# Patient Record
Sex: Male | Born: 1950 | Race: White | Hispanic: No | Smoking: Never smoker
Health system: Southern US, Community
[De-identification: ages and names within clinical notes are randomized; demographics above are authoritative.]

## PROBLEM LIST (undated history)

## (undated) DIAGNOSIS — K766 Portal hypertension: Secondary | ICD-10-CM

## (undated) DIAGNOSIS — Z91018 Allergy to other foods: Secondary | ICD-10-CM

## (undated) DIAGNOSIS — I8500 Esophageal varices without bleeding: Secondary | ICD-10-CM

## (undated) DIAGNOSIS — Z9289 Personal history of other medical treatment: Secondary | ICD-10-CM

## (undated) DIAGNOSIS — I10 Essential (primary) hypertension: Secondary | ICD-10-CM

## (undated) DIAGNOSIS — H3320 Serous retinal detachment, unspecified eye: Secondary | ICD-10-CM

## (undated) DIAGNOSIS — M1990 Unspecified osteoarthritis, unspecified site: Secondary | ICD-10-CM

## (undated) DIAGNOSIS — D696 Thrombocytopenia, unspecified: Secondary | ICD-10-CM

## (undated) DIAGNOSIS — A6920 Lyme disease, unspecified: Secondary | ICD-10-CM

## (undated) DIAGNOSIS — B1920 Unspecified viral hepatitis C without hepatic coma: Secondary | ICD-10-CM

## (undated) DIAGNOSIS — M869 Osteomyelitis, unspecified: Secondary | ICD-10-CM

## (undated) DIAGNOSIS — K7030 Alcoholic cirrhosis of liver without ascites: Secondary | ICD-10-CM

## (undated) DIAGNOSIS — H35039 Hypertensive retinopathy, unspecified eye: Secondary | ICD-10-CM

## (undated) HISTORY — DX: Hypertensive retinopathy, unspecified eye: H35.039

## (undated) HISTORY — PX: CATARACT EXTRACTION: SUR2

## (undated) HISTORY — PX: RETINAL DETACHMENT SURGERY: SHX105

## (undated) HISTORY — PX: CATARACT EXTRACTION W/ INTRAOCULAR LENS  IMPLANT, BILATERAL: SHX1307

## (undated) HISTORY — DX: Unspecified osteoarthritis, unspecified site: M19.90

## (undated) HISTORY — PX: FRACTURE SURGERY: SHX138

## (undated) HISTORY — PX: HIP ARTHROSCOPY: SHX668

## (undated) HISTORY — PX: EYE SURGERY: SHX253

## (undated) HISTORY — PX: JOINT REPLACEMENT: SHX530

## (undated) HISTORY — DX: Unspecified viral hepatitis C without hepatic coma: B19.20

## (undated) HISTORY — DX: Serous retinal detachment, unspecified eye: H33.20

## (undated) SURGERY — Surgical Case
Anesthesia: *Unknown

---

## 1973-11-07 HISTORY — PX: ORIF CONGENITAL HIP DISLOCATION: SHX2117

## 1973-11-07 HISTORY — PX: ORIF TIBIA & FIBULA FRACTURES: SHX2131

## 2000-09-22 ENCOUNTER — Encounter: Payer: Self-pay | Admitting: Gastroenterology

## 2000-09-22 ENCOUNTER — Ambulatory Visit (HOSPITAL_COMMUNITY): Admission: RE | Admit: 2000-09-22 | Discharge: 2000-09-22 | Payer: Self-pay | Admitting: Gastroenterology

## 2000-09-22 ENCOUNTER — Encounter (INDEPENDENT_AMBULATORY_CARE_PROVIDER_SITE_OTHER): Payer: Self-pay | Admitting: Specialist

## 2006-11-07 HISTORY — PX: LAPAROSCOPIC CHOLECYSTECTOMY: SUR755

## 2007-01-25 ENCOUNTER — Inpatient Hospital Stay (HOSPITAL_COMMUNITY): Admission: EM | Admit: 2007-01-25 | Discharge: 2007-01-28 | Payer: Self-pay | Admitting: Emergency Medicine

## 2007-01-25 ENCOUNTER — Encounter (INDEPENDENT_AMBULATORY_CARE_PROVIDER_SITE_OTHER): Payer: Self-pay | Admitting: *Deleted

## 2008-08-08 ENCOUNTER — Inpatient Hospital Stay (HOSPITAL_COMMUNITY): Admission: AC | Admit: 2008-08-08 | Discharge: 2008-08-13 | Payer: Self-pay

## 2010-06-25 ENCOUNTER — Ambulatory Visit: Payer: Self-pay | Admitting: Internal Medicine

## 2010-06-25 LAB — CONVERTED CEMR LAB
ALT: 150 units/L — ABNORMAL HIGH (ref 0–53)
AST: 174 units/L — ABNORMAL HIGH (ref 0–37)
Albumin: 4.4 g/dL (ref 3.5–5.2)
Alkaline Phosphatase: 134 units/L — ABNORMAL HIGH (ref 39–117)
Basophils Relative: 1 % (ref 0–1)
CO2: 27 meq/L (ref 19–32)
Creatinine, Ser: 0.72 mg/dL (ref 0.40–1.50)
Direct LDL: 102 mg/dL — ABNORMAL HIGH
Glucose, Bld: 97 mg/dL (ref 70–99)
Neutro Abs: 2.4 10*3/uL (ref 1.7–7.7)
Neutrophils Relative %: 49 % (ref 43–77)
Platelets: 145 10*3/uL — ABNORMAL LOW (ref 150–400)
Potassium: 4.9 meq/L (ref 3.5–5.3)
RBC: 4.51 M/uL (ref 4.22–5.81)
Sodium: 137 meq/L (ref 135–145)
TSH: 2.36 microintl units/mL (ref 0.350–4.500)
Total Bilirubin: 0.7 mg/dL (ref 0.3–1.2)
WBC: 4.9 10*3/uL (ref 4.0–10.5)

## 2010-07-09 ENCOUNTER — Ambulatory Visit: Payer: Self-pay | Admitting: Internal Medicine

## 2010-07-09 ENCOUNTER — Encounter (INDEPENDENT_AMBULATORY_CARE_PROVIDER_SITE_OTHER): Payer: Self-pay | Admitting: Family Medicine

## 2010-07-09 LAB — CONVERTED CEMR LAB
AST: 224 units/L — ABNORMAL HIGH (ref 0–37)
Albumin: 4.3 g/dL (ref 3.5–5.2)
BUN: 8 mg/dL (ref 6–23)
CO2: 23 meq/L (ref 19–32)
Calcium: 9.4 mg/dL (ref 8.4–10.5)
Chloride: 90 meq/L — ABNORMAL LOW (ref 96–112)
Glucose, Bld: 107 mg/dL — ABNORMAL HIGH (ref 70–99)
HCV Ab: REACTIVE — AB
HCV Quantitative: 1230000 intl units/mL — ABNORMAL HIGH (ref <43)
Hep B S Ab: NEGATIVE
Sodium: 127 meq/L — ABNORMAL LOW (ref 135–145)
TIBC: 494 ug/dL — ABNORMAL HIGH (ref 215–435)
Total Protein: 7.1 g/dL (ref 6.0–8.3)

## 2010-07-10 ENCOUNTER — Ambulatory Visit (HOSPITAL_COMMUNITY): Admission: RE | Admit: 2010-07-10 | Discharge: 2010-07-10 | Payer: Self-pay | Admitting: Internal Medicine

## 2010-11-07 HISTORY — PX: TOTAL HIP ARTHROPLASTY: SHX124

## 2010-11-11 ENCOUNTER — Ambulatory Visit
Admission: RE | Admit: 2010-11-11 | Discharge: 2010-11-11 | Payer: Self-pay | Source: Home / Self Care | Attending: Gastroenterology | Admitting: Gastroenterology

## 2010-11-11 DIAGNOSIS — B182 Chronic viral hepatitis C: Secondary | ICD-10-CM

## 2010-12-01 ENCOUNTER — Ambulatory Visit (HOSPITAL_COMMUNITY)
Admission: RE | Admit: 2010-12-01 | Discharge: 2010-12-01 | Payer: Self-pay | Source: Home / Self Care | Attending: Gastroenterology | Admitting: Gastroenterology

## 2010-12-01 LAB — CBC
HCT: 38.7 % — ABNORMAL LOW (ref 39.0–52.0)
Hemoglobin: 14.4 g/dL (ref 13.0–17.0)
MCHC: 36.6 g/dL — ABNORMAL HIGH (ref 30.0–36.0)
MCV: 96 fL (ref 78.0–100.0)
Platelets: 132 10*3/uL — ABNORMAL LOW (ref 150–400)
RDW: 12.9 % (ref 11.5–15.5)
WBC: 5.2 10*3/uL (ref 4.0–10.5)

## 2010-12-01 LAB — PROTIME-INR: Prothrombin Time: 15.7 seconds — ABNORMAL HIGH (ref 11.6–15.2)

## 2010-12-08 ENCOUNTER — Encounter: Payer: Self-pay | Admitting: Gastroenterology

## 2010-12-10 ENCOUNTER — Emergency Department (HOSPITAL_COMMUNITY)
Admission: EM | Admit: 2010-12-10 | Discharge: 2010-12-10 | Disposition: A | Discharge status: Home / Self Care | Payer: Self-pay | Attending: Emergency Medicine | Admitting: Emergency Medicine

## 2010-12-10 ENCOUNTER — Emergency Department (HOSPITAL_COMMUNITY): Payer: Self-pay

## 2010-12-10 DIAGNOSIS — M1610 Unilateral primary osteoarthritis, unspecified hip: Secondary | ICD-10-CM | POA: Insufficient documentation

## 2010-12-10 DIAGNOSIS — Z9089 Acquired absence of other organs: Secondary | ICD-10-CM | POA: Insufficient documentation

## 2010-12-10 DIAGNOSIS — I10 Essential (primary) hypertension: Secondary | ICD-10-CM | POA: Insufficient documentation

## 2010-12-10 DIAGNOSIS — M169 Osteoarthritis of hip, unspecified: Secondary | ICD-10-CM | POA: Insufficient documentation

## 2010-12-10 DIAGNOSIS — Z8619 Personal history of other infectious and parasitic diseases: Secondary | ICD-10-CM | POA: Insufficient documentation

## 2010-12-10 DIAGNOSIS — R1011 Right upper quadrant pain: Secondary | ICD-10-CM | POA: Insufficient documentation

## 2010-12-10 DIAGNOSIS — E871 Hypo-osmolality and hyponatremia: Secondary | ICD-10-CM | POA: Insufficient documentation

## 2010-12-10 DIAGNOSIS — K7460 Unspecified cirrhosis of liver: Secondary | ICD-10-CM | POA: Insufficient documentation

## 2010-12-10 DIAGNOSIS — Z9889 Other specified postprocedural states: Secondary | ICD-10-CM | POA: Insufficient documentation

## 2010-12-10 LAB — COMPREHENSIVE METABOLIC PANEL
ALT: 157 U/L — ABNORMAL HIGH (ref 0–53)
Albumin: 3.8 g/dL (ref 3.5–5.2)
BUN: 9 mg/dL (ref 6–23)
CO2: 28 mEq/L (ref 19–32)
Chloride: 87 mEq/L — ABNORMAL LOW (ref 96–112)
Creatinine, Ser: 0.62 mg/dL (ref 0.4–1.5)
GFR calc Af Amer: >60 mL/min (ref >60)
Glucose, Bld: 101 mg/dL — ABNORMAL HIGH (ref 70–99)
Total Bilirubin: 1.5 mg/dL — ABNORMAL HIGH (ref 0.3–1.2)

## 2010-12-10 LAB — DIFFERENTIAL
Basophils Absolute: 0.1 10*3/uL (ref 0.0–0.1)
Eosinophils Relative: 6 % — ABNORMAL HIGH (ref 0–5)
Lymphocytes Relative: 34 % (ref 12–46)
Lymphocytes Relative: 31 % (ref 12–46)
Lymphs Abs: 2.7 10*3/uL (ref 0.7–4.0)
Monocytes Absolute: 1 10*3/uL (ref 0.1–1.0)
Monocytes Relative: 13 % — ABNORMAL HIGH (ref 3–12)
Monocytes Relative: 15 % — ABNORMAL HIGH (ref 3–12)
Neutrophils Relative %: 47 % (ref 43–77)

## 2010-12-10 LAB — CBC
HCT: 38.2 % — ABNORMAL LOW (ref 39.0–52.0)
Hemoglobin: 14.1 g/dL (ref 13.0–17.0)
MCHC: 37.5 g/dL — ABNORMAL HIGH (ref 30.0–36.0)
MCHC: 36.9 g/dL — ABNORMAL HIGH (ref 30.0–36.0)

## 2010-12-10 LAB — PROTIME-INR
INR: 1.1 (ref 0.00–1.49)
Prothrombin Time: 14.4 seconds (ref 11.6–15.2)

## 2010-12-10 MED ORDER — IOHEXOL 300 MG/ML  SOLN: 100.0000 mL | Freq: Once | INTRAMUSCULAR | Status: DC | PRN
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
## 2011-03-22 NOTE — Op Note (Signed)
NAME:  Joshua Burton, Joshua Burton                 ACCOUNT NO.:  [id number]   MEDICAL RECORD NO.:  [id number]          PATIENT TYPE:  INP   LOCATION:  3009                         FACILITY:  [facility]   PHYSICIAN:  Juanetta Gosling, MDDATE OF BIRTH:  [birth year]   DATE OF PROCEDURE:  08/08/2008  DATE OF DISCHARGE:                               OPERATIVE REPORT   INDICATIONS:  Glabellar laceration.   ANESTHESIA:  Approximately 3 mL of 2% lidocaine with epinephrine.   SURGEON:  Nolon Bussing. Tinnie Gens, PA-C   ASSISTANT:  None.   ESTIMATED BLOOD LOSS:  None.   COMPLICATIONS:  None.   FINDINGS:  After the patient was anesthetized with the lidocaine, he was  prepped and draped in a sterile fashion.  He then had his wound scrubbed  with sterile 4 x 4s soaked in Betadine and then washed out with  approximately 75 mL of normal saline irrigation.  Some old clots were  evacuated from the wound and when this was done, it was evident that the  laceration continued down to the galea.  A single 4-0 Vicryl suture was  placed to approximate the lower structures.  Then 6-0 interrupted nylon  sutures were used to approximate the rest of the wound.  The patient had  good approximation and tolerated the procedure well.  Wound was dressed  with bacitracin and dry dressing.      Earney Hamburg, P.A.      Juanetta Gosling, MD  Electronically Signed    MJ/MEDQ  D:  08/08/2008  T:  08/09/2008  Job:  [id number]

## 2011-03-25 NOTE — Discharge Summary (Signed)
NAME:  Joshua Burton, Joshua Burton                 ACCOUNT NO.:  [id number]   MEDICAL RECORD NO.:  [id number]          PATIENT TYPE:  INP   LOCATION:  5705                         FACILITY:  [facility]   PHYSICIAN:  Sharlet Salina T. Hoxworth, M.D.DATE OF BIRTH:  [birth year]   DATE OF ADMISSION:  01/24/2007  DATE OF DISCHARGE:  01/28/2007                               DISCHARGE SUMMARY   DISCHARGE DIAGNOSES:  1. Acute cholecystitis.  2. Hepatitis C.   OPERATIONS AND PROCEDURES:  Laparoscopic cholecystectomy, Dr. Derrell Lolling on  January 26, 2007.   HISTORY OF PRESENT ILLNESS:  This patient is a 60 year old male who  presents with acute right upper quadrant abdominal pain.  He has a  history of hepatitis C.   PAST MEDICAL HISTORY:  As above with history of liver biopsy.  No other  medical or surgical problems.   MEDICATIONS:  None.   ALLERGIES:  None.   PERTINENT PHYSICAL EXAMINATION:  He had low-grade fever, mild  tachycardia.  Abdominal exam showed marked right upper quadrant  tenderness with guarding, well localized.   LABORATORY:  White count was elevated 11.9.   CT scan of the abdomen and pelvis obtained in the emergency room showed  a significant acute cholecystitis with fluid around the gallbladder.   HOSPITAL COURSE:  The patient was admitted, begun on IV antibiotics and  hydration.  He was taken to the operating room on January 25, 2007, by Dr.  Derrell Lolling and underwent laparoscopic cholecystectomy with intraoperative  cholangiogram with findings of acute cholecystitis.   His postoperative course was marked by a fair amount of soreness and  some delay in getting up and around, but he progressed without  complication and was discharged home on January 28, 2007.  Jackson-Pratt  drain that had been left in was removed prior to discharge.   Final pathology showed acute gangrenous cholecystitis and  cholelithiasis.   DISCHARGE MEDICATIONS:  Only oral pain medication.   FOLLOWUP:  With Dr. Derrell Lolling in the  office in 1-2 weeks.      Lorne Skeens. Hoxworth, M.D.  Electronically Signed     BTH/MEDQ  D:  02/28/2007  T:  02/28/2007  Job:  [id number]

## 2011-03-25 NOTE — Op Note (Signed)
NAME:  Joshua Burton, Joshua Burton                 ACCOUNT NO.:  [id number]   MEDICAL RECORD NO.:  [id number]          PATIENT TYPE:  INP   LOCATION:  5705                         FACILITY:  [facility]   PHYSICIAN:  Angelia Mould. Derrell Lolling, M.D.DATE OF BIRTH:  [birth year]   DATE OF PROCEDURE:  01/25/2007  DATE OF DISCHARGE:                               OPERATIVE REPORT   PREOPERATIVE DIAGNOSIS:  Acute cholecystitis with cholelithiasis.   POSTOPERATIVE DIAGNOSIS:  Acute cholecystitis with cholelithiasis.   OPERATION PERFORMED:  Laparoscopic cholecystectomy with intraoperative  cholangiogram.   SURGEON:  Angelia Mould. Derrell Lolling, M.D.   FIRST ASSISTANT:  Gabrielle Dare. Janee Morn, M.D.   OPERATIVE INDICATIONS:  This is a 60 year old white man who was admitted  late last night with a day and a half history of a upper abdominal and  right upper quadrant pain and nausea.  He has a past history of  hepatitis C followed by [name].  He was admitted by Dr. Johna Sheriff,  started on intravenous antibiotics.  He was counseled regarding  cholecystectomy.  It should be noted that his CT scan showed a thick-  walled inflamed gallbladder.  He agreed to the surgery.  He is brought  to operating room semi urgently.   OPERATIVE FINDINGS:  The gallbladder was thick-walled, acutely inflamed,  edematous.  Somewhat discolored in a patchy fashion.  The cystic duct  was short, but was patent.  The anatomy of the cystic duct, cystic  artery and common bile duct were conventional.  The cholangiogram was  normal showing normal intrahepatic and extrahepatic bile ducts, no  filling defects, and no obstruction with good flow of contrast into the  duodenum.  The liver, duodenum, stomach, large intestine, small  intestine and peritoneal surfaces were otherwise grossly normal.   OPERATIVE TECHNIQUE:  Following the induction of general endotracheal  anesthesia the patient's abdomen was prepped and draped in a sterile  fashion.  The patient was  identified as to correct patient and correct  procedure.  Intravenous antibiotics were given.  Marcaine 0.5% with  epinephrine was used as local infiltration anesthetic.  A vertically  oriented incision was made just above the umbilicus.  The fascia was  incised in the midline and the abdominal cavity entered under direct  vision.  A 10 mm Hassan trocar was inserted and secured with a  pursestring suture of 0 Vicryl.  Pneumoperitoneum was created.  The  camera was inserted with visualization findings as described above.  A  10 mm trocar was placed in the subxiphoid region and two 5 mm trocars  placed in the right upper quadrant.  I ultimately had to place another 5  mm trocar about midway between the umbilicus and the xiphoid for a  deformable retractor.  The gallbladder fundus was visualized and  dissected away from the inflammatory rind around it.  Suction trocar was  inserted into the fundus and we aspirated very dark bile out of the  gallbladder.  This collapsed somewhat and allowed [place] to grab it.  We  then elevated the gallbladder and then slowly dissected the inflammatory  tissues  away from it down to the lower body and infundibulum.  A  retractor was placed below the infundibulum to retract the tissues  inferiorly and posteriorly and then I could dissect out the cystic duct  and the cystic artery.  The cystic artery was secured with multiple  metal clips and divided as it went onto the wall of the gallbladder.  A  cholangiogram catheter was inserted into the cystic duct.  A  cholangiogram was obtained using the C-arm.  The cholangiogram was  normal as described above.  The cholangiogram catheter was removed.  The  cystic duct was secured with multiple metal clips and divided.  The  gallbladder was dissected from its bed with electrocautery and blunt  dissection, placed in a specimen bag and removed through the umbilical  port.  We had to make a small extension of the umbilical  incision to get  the gallbladder out because the stones were so large an it was so  thickened.  The operative field was copiously irrigated.  Hemostasis was  good and used with electrocautery in the bed of the gallbladder.  I  irrigated out all the fluid and I saw no bleeding and no bile leak  whatsoever at the end of the case.  I did chose to put a piece of  Surgicel in the lower body of the gallbladder because it was such a raw  surface.  A 19-French Blake drain was placed in the subhepatic space,  brought out through one of the right upper quadrant trocar sites and  sutured to the skin with a nylon suture and connected to a suction bulb.  We looked around, saw no other signs of bleeding or problems.  Trocars  were removed.  No bleeding from trocar sites.  Pneumoperitoneum  released.  The fascia at the umbilicus was closed with several  interrupted sutures of 0 Vicryl.  Skin incisions were closed with skin  staples.  Clean bandages were placed and the patient taken to the  recovery room in stable condition.  Estimated blood loss was about 30 to  40 cc.  Complications:  None.  Sponge and instrument counts were  correct.      Angelia Mould. Derrell Lolling, M.D.  Electronically Signed     HMI/MEDQ  D:  01/25/2007  T:  01/25/2007  Job:  [id number]   cc:   [facility]  Fayrene Fearing L. Malon Kindle., M.D.

## 2011-03-25 NOTE — H&P (Signed)
NAME:  Joshua Burton, Joshua Burton                 ACCOUNT NO.:  [id number]   MEDICAL RECORD NO.:  [id number]          PATIENT TYPE:  EMS   LOCATION:  MAJO                         FACILITY:  [facility]   PHYSICIAN:  Sharlet Salina T. Hoxworth, M.D.DATE OF BIRTH:  1950-12-11   DATE OF ADMISSION:  01/24/2007  DATE OF DISCHARGE:                              HISTORY & PHYSICAL   CHIEF COMPLAINT:  Right upper quadrant abdominal pain.   HISTORY OF PRESENT ILLNESS:  Mr. Joshua Burton is a 60 year old male who late last  night, about 24 hours ago, developed the rapid onset of right upper  quadrant abdominal pain that quickly became severe.  The pain has been  constant ever since.  He describes aching and cramping pain in his right  upper quadrant and up under his right rib cage, radiating through to his  back.  This has been associated with nausea and he has vomited 2 times.  He tried to work today, but the pain was severe and [facility] went to [facility] and then was referred from there to the [facility]  Emergency Room.  The patient states he had a very mild episode of  similar pain several days ago that resolved prior to this episode.  Otherwise, he denies any similar or recurrent pain or chronic GI  complaints.  Bowel movements have been normal.  No melena or  hematochezia.   PAST MEDICAL HISTORY:  Significant for hepatitis C; this has been  followed by Dr. Vilinda Burton.  He has had a liver biopsy.  He has not  required any recent followup and has no known sequelae.  Otherwise, no  significant medical or surgical problems.   MEDICATIONS:  None.   ALLERGIES:  None.   SOCIAL HISTORY:  He is divorced.  He works at his own plumbing business.  He does not smoke cigarettes, drinks about 2 beers a day.   FAMILY HISTORY:  Noncontributory.   REVIEW OF SYSTEMS:  GENERAL:  Some fever with this illness.  No chills.  HEENT:  No vision, hearing or swallowing problems.  RESPIRATORY:  No  shortness of breath, cough or history  of pulmonary disease.  CARDIAC:  No chest pain, palpitations or history of heart disease.  ABDOMEN/GI:  As above.  GU:  No urinary burning or frequency.  MUSCULOSKELETAL:  No  joint pain or swelling.  HEMATOLOGIC:  No history of abnormal bleeding  or blood clot.   PHYSICAL EXAM:  VITAL SIGNS:  Temperature is 100.6, pulse 115,  respirations 18, blood pressure 153/87.  GENERAL:  Muscular male appears in pain.  SKIN:  Warm and dry.  No rash or infection.  HEENT:  No palpable mass or thyromegaly.  Sclerae are anicteric.  Nares  and oropharynx clear.  LUNGS:  Clear without wheezing or increased work of breathing.  LYMPH NODES:  No cervical, supraclavicular or inguinal nodes palpable.  CARDIAC:  Regular tachycardia and no murmurs.  Peripheral pulses intact.  No JVD or edema.  ABDOMEN:  Marked right upper quadrant tenderness with guarding, well  localized.  No palpable masses or hepatosplenomegaly.  No hernias.  EXTREMITIES:  No joint swelling or deformity.  NEUROLOGIC:  Alert and oriented.  Motor and sensory exams grossly  normal.   LABORATORY:  White count 11.9, hemoglobin is 17.5, hematocrit 51.7,  platelets 240,000.  Urinalysis negative.  LFTs, lipase, electrolytes  pending.   CT scan of the abdomen and pelvis was obtained in the emergency room,  which shows a significant acute cholecystitis with fluid around the  gallbladder.  No other abnormalities.   ASSESSMENT AND PLAN:  Acute cholecystitis.  The patient is being  admitted and started on broad-spectrum intravenous antibiotics.  We will  obtain liver function tests, lipase and clotting studies.  Will need  laparoscopic cholecystectomy.      Joshua Burton. Hoxworth, M.D.  Electronically Signed     BTH/MEDQ  D:  01/25/2007  T:  01/25/2007  Job:  [id number]

## 2011-08-08 LAB — CBC
HCT: 25.3 — ABNORMAL LOW
HCT: 28.5 — ABNORMAL LOW
HCT: 29.4 — ABNORMAL LOW
Hemoglobin: 10.2 — ABNORMAL LOW
Hemoglobin: 12.4 — ABNORMAL LOW
Hemoglobin: 15.8
Hemoglobin: 8.7 — ABNORMAL LOW
MCHC: 34.4
MCHC: 34.5
MCHC: 34.8
MCHC: 35.1
MCV: 95.6
MCV: 95.9
MCV: 96.2
MCV: 96.3
RBC: 2.63 — ABNORMAL LOW
RBC: 2.98 — ABNORMAL LOW
RBC: 3.7 — ABNORMAL LOW
RBC: 4.74
RDW: 12.6
RDW: 13.5
WBC: 5.1
WBC: 7.5
WBC: 8.5

## 2011-08-08 LAB — BASIC METABOLIC PANEL
BUN: 5 — ABNORMAL LOW
BUN: 6
CO2: 26
CO2: 28
CO2: 28
Calcium: 7.8 — ABNORMAL LOW
Chloride: 102
Chloride: 94 — ABNORMAL LOW
Chloride: 97
GFR calc Af Amer: >60
GFR calc Af Amer: >60
GFR calc non Af Amer: >60
Glucose, Bld: 103 — ABNORMAL HIGH
Glucose, Bld: 161 — ABNORMAL HIGH
Potassium: 3.2 — ABNORMAL LOW
Potassium: 4
Sodium: 126 — ABNORMAL LOW
Sodium: 130 — ABNORMAL LOW

## 2011-08-08 LAB — RAPID URINE DRUG SCREEN, HOSP PERFORMED
Amphetamines: NOT DETECTED
Barbiturates: NOT DETECTED
Benzodiazepines: NOT DETECTED

## 2011-08-08 LAB — DIFFERENTIAL
Basophils Absolute: 0.1
Basophils Relative: 1
Eosinophils Absolute: 0.2
Eosinophils Relative: 3
Neutrophils Relative %: 37 — ABNORMAL LOW

## 2011-08-08 LAB — CROSSMATCH

## 2011-08-08 LAB — LIPASE, BLOOD: Lipase: 39

## 2011-08-08 LAB — SAMPLE TO BLOOD BANK

## 2011-08-08 LAB — URINALYSIS, ROUTINE W REFLEX MICROSCOPIC
Glucose, UA: NEGATIVE
Hgb urine dipstick: NEGATIVE
Ketones, ur: NEGATIVE
Ketones, ur: NEGATIVE
Protein, ur: NEGATIVE
Urobilinogen, UA: 0.2
pH: 7

## 2011-08-08 LAB — COMPREHENSIVE METABOLIC PANEL
ALT: 188 — ABNORMAL HIGH
Alkaline Phosphatase: 130 — ABNORMAL HIGH
CO2: 23
Calcium: 8.4
Chloride: 104
Glucose, Bld: 118 — ABNORMAL HIGH
Potassium: 4
Sodium: 136
Total Bilirubin: 0.8

## 2011-08-08 LAB — POCT I-STAT, CHEM 8
Calcium, Ion: 0.84 — ABNORMAL LOW
Hemoglobin: 16
Sodium: 135
TCO2: 21

## 2013-01-05 ENCOUNTER — Emergency Department (HOSPITAL_COMMUNITY)
Admission: EM | Admit: 2013-01-05 | Discharge: 2013-01-05 | Disposition: A | Discharge status: Home / Self Care | Payer: Medicare PPO | Source: Home / Self Care | Attending: Family Medicine | Admitting: Family Medicine

## 2013-01-05 ENCOUNTER — Emergency Department (HOSPITAL_COMMUNITY): Payer: Medicare PPO

## 2013-01-05 ENCOUNTER — Encounter (HOSPITAL_COMMUNITY): Payer: Self-pay | Admitting: Physical Medicine and Rehabilitation

## 2013-01-05 ENCOUNTER — Inpatient Hospital Stay (HOSPITAL_COMMUNITY)
Admission: EM | Admit: 2013-01-05 | Discharge: 2013-01-12 | DRG: 603 | Disposition: A | Discharge status: Home / Self Care | Payer: Medicare PPO | Attending: Internal Medicine | Admitting: Internal Medicine

## 2013-01-05 ENCOUNTER — Encounter (HOSPITAL_COMMUNITY): Payer: Self-pay | Admitting: Emergency Medicine

## 2013-01-05 DIAGNOSIS — I10 Essential (primary) hypertension: Secondary | ICD-10-CM | POA: Diagnosis present

## 2013-01-05 DIAGNOSIS — L03119 Cellulitis of unspecified part of limb: Secondary | ICD-10-CM

## 2013-01-05 DIAGNOSIS — Z8249 Family history of ischemic heart disease and other diseases of the circulatory system: Secondary | ICD-10-CM

## 2013-01-05 DIAGNOSIS — L02419 Cutaneous abscess of limb, unspecified: Principal | ICD-10-CM | POA: Diagnosis present

## 2013-01-05 DIAGNOSIS — I889 Nonspecific lymphadenitis, unspecified: Secondary | ICD-10-CM

## 2013-01-05 DIAGNOSIS — E871 Hypo-osmolality and hyponatremia: Secondary | ICD-10-CM | POA: Diagnosis present

## 2013-01-05 DIAGNOSIS — D696 Thrombocytopenia, unspecified: Secondary | ICD-10-CM | POA: Diagnosis present

## 2013-01-05 DIAGNOSIS — Z79899 Other long term (current) drug therapy: Secondary | ICD-10-CM

## 2013-01-05 HISTORY — DX: Essential (primary) hypertension: I10

## 2013-01-05 LAB — POCT I-STAT, CHEM 8
Creatinine, Ser: 0.8 mg/dL (ref 0.50–1.35)
HCT: 47 % (ref 39.0–52.0)
Hemoglobin: 16 g/dL (ref 13.0–17.0)
Potassium: 4.1 mEq/L (ref 3.5–5.1)
Sodium: 130 mEq/L — ABNORMAL LOW (ref 135–145)
TCO2: 29 mmol/L (ref 0–100)

## 2013-01-05 LAB — CBC WITH DIFFERENTIAL/PLATELET
Basophils Absolute: 0.1 10*3/uL (ref 0.0–0.1)
Basophils Relative: 1 % (ref 0–1)
Eosinophils Absolute: 0.4 10*3/uL (ref 0.0–0.7)
Eosinophils Relative: 4 % (ref 0–5)
MCH: 34.1 pg — ABNORMAL HIGH (ref 26.0–34.0)
MCHC: 35.5 g/dL (ref 30.0–36.0)
MCV: 96.1 fL (ref 78.0–100.0)
Neutrophils Relative %: 53 % (ref 43–77)
Platelets: 128 10*3/uL — ABNORMAL LOW (ref 150–400)
RBC: 4.34 MIL/uL (ref 4.22–5.81)
RDW: 13.2 % (ref 11.5–15.5)

## 2013-01-05 MED ORDER — OXYCODONE-ACETAMINOPHEN 5-325 MG PO TABS
2.0000 | ORAL_TABLET | Freq: Once | ORAL | Status: AC
  Administered 2013-01-05: 2 via ORAL
  Filled 2013-01-05: qty 2

## 2013-01-05 MED ORDER — VANCOMYCIN HCL IN DEXTROSE 1-5 GM/200ML-% IV SOLN
1000.0000 mg | Freq: Two times a day (BID) | INTRAVENOUS | Status: DC
  Administered 2013-01-05: 1000 mg via INTRAVENOUS
  Filled 2013-01-05: qty 200

## 2013-01-05 NOTE — ED Notes (Signed)
Right lower leg, ankle, foot appear red and swollen, no injury noted. Pt had reconstructive surgery on same leg in '75. Also reports tender lymph nodes in groin region since this swelling has started.  Leg propped up on pillows.
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
## 2013-01-05 NOTE — ED Notes (Signed)
Pt presents to department from [facility] for evaluation of R ankle foot pain and swelling. Onset this morning. R ankle/foot noted to be red and swollen. 8/10 pain, tender to touch. CMS intact. Denies numbness/tingling. Pt is conscious alert and oriented x4.

## 2013-01-05 NOTE — ED Provider Notes (Signed)
History     CSN: [id number]  Arrival date & time 01/05/13  1618   First MD Initiated Contact with Patient 01/05/13 1619      Chief Complaint  Patient presents with  . Foot Swelling    right foot swelling hx hip replacement and fracture repair    (Consider location/radiation/quality/duration/timing/severity/associated sxs/prior treatment) Patient is a 62 y.o. male presenting with ankle pain. The history is provided by the patient.  Ankle Pain Location:  Ankle Time since incident:  1 day Injury: no   Ankle location:  R ankle Pain details:    Quality:  Aching   Radiates to:  Does not radiate   Severity:  Moderate   Onset quality:  Sudden   Timing:  Constant Prior injury to area:  Yes Associated symptoms: decreased ROM and swelling   Associated symptoms: no fever   Associated symptoms comment:  Right inguinal tender adenopathy noticed today.   Past Medical History  Diagnosis Date  . Hypertension     Past Surgical History  Procedure Laterality Date  . Hip surgery    . Fracture surgery      History reviewed. No pertinent family history.  History  Substance Use Topics  . Smoking status: Never Smoker   . Smokeless tobacco: Never Used  . Alcohol Use: Yes     Comment: social      Review of Systems  Constitutional: Negative.  Negative for fever.  Musculoskeletal: Positive for joint swelling and gait problem.  Skin: Positive for rash and wound.    Allergies  Review of patient's allergies indicates no known allergies.  Home Medications   No current outpatient prescriptions on file.  BP 173/84  Pulse 70  Temp(Src) 98.1 F (36.7 C) (Oral)  Resp 16  SpO2 98%  Physical Exam  Nursing note and vitals reviewed. Constitutional: He is oriented to person, place, and time. He appears well-developed and well-nourished.  Musculoskeletal: He exhibits tenderness.       Right ankle: He exhibits decreased range of motion and swelling.       Feet:  Tender right  inguinal nodes.  Neurological: He is alert and oriented to person, place, and time.  Skin: Skin is warm and dry. There is erythema.    ED Course  Procedures (including critical care time)  Labs Reviewed - No data to display Dg Ankle 2 Views Right  01/05/2013  *RADIOLOGY REPORT*  Clinical Data: Ankle pain.  Concern for osteomyelitis.  History of bone infection one and half years ago.  RIGHT ANKLE - 2 VIEW  Comparison:  None.  Findings:  Prior right tibial fracture with bony overgrowth at the fracture site.  Distortion of the ankle joint with significant bony overgrowth.  Soft tissue swelling.  This may represent dependent edema although cellulitis not excluded in the proper clinical setting.  Difficult to evaluate for subtle osteomyelitis given the diffuse bony changes from prior injury.  On the lateral view, subtle lucencies mid to distal right tibial shaft.  Although this may represent a normal finding, result of osteomyelitis not excluded.  IMPRESSION: Soft tissue swelling.  This may represent dependent edema although cellulitis not excluded in the proper clinical setting.  Difficult to evaluate for subtle osteomyelitis given the diffuse bony changes from prior injury.  On the lateral view, subtle lucencies mid to distal right tibial shaft.  Although this may represent a normal finding, result of osteomyelitis not excluded.   Original Report Authenticated By: Lacy Duverney, M.D.  Mr Foot Right W Wo Contrast  01/06/2013  *RADIOLOGY REPORT*  Clinical Data: Right ankle pain and swelling.  History of osteomyelitis.  MRI OF THE RIGHT FOREFOOT WITHOUT AND WITH CONTRAST  Technique:  Multiplanar, multisequence MR imaging was performed both before and after administration of intravenous contrast.  Contrast: 20mL MULTIHANCE GADOBENATE DIMEGLUMINE 529 MG/ML IV SOLN  Comparison: Lower leg radiographs 01/05/2013.  Findings: Examination is mildly motion degraded.  This portion of the study includes the midfoot and  forefoot.  The ankle findings are dictated separately.  There are relatively mild degenerative changes throughout the midfoot with osteophytes and subchondral cyst formation in the cuneiforms and metatarsal bases.  There is ankylosis between the medial and middle cuneiforms.  There is no evidence of acute fracture, dislocation or bone destruction to suggest osteomyelitis. There is no suspicious marrow edema or enhancement.  There is diffuse forefoot muscular atrophy.  Minimal subcutaneous edema is present medially.  There is no evidence of focal fluid collection.  The forefoot tendons appear intact.  IMPRESSION:  1.  No evidence of right forefoot osteomyelitis or soft tissue abscess. 2.  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   Original Report Authenticated By: Carey Bullocks, M.D.    Mr Ankle Right W Wo Contrast  01/06/2013  *RADIOLOGY REPORT*  Clinical Data:  Right ankle pain and swelling.  History of osteomyelitis.  MRI OF THE RIGHT ANKLE WITH AND WITHOUT CONTRAST  Technique:  Multiplanar, multisequence MR imaging of the right ankle was performed before and after the administration of intravenous contrast.  Contrast: 20mL MULTIHANCE GADOBENATE DIMEGLUMINE 529 MG/ML IV SOLN  Comparison:  Radiographs 01/05/2013.  Findings: Study is mildly motion degraded.  Images extend from the mid lower leg through the ankle.  Foot findings are dictated separately.  There is post-traumatic deformity of the distal tibial and fibular diaphyses consistent with old healed fractures.  There is no evidence of cortical destruction, marrow edema or abnormal enhancement to suggest osteomyelitis.  The patient is status post tibiotalar arthrodesis which appears solid. There is postsurgical susceptibility artifact laterally at the arthrodesis.  There are degenerative changes at the subtalar and talonavicular joints with associated subchondral cyst formation and edema.  There is no cortical  destruction in these areas to suggest osteomyelitis.  There is mild subcutaneous edema throughout the lower leg, especially medially.  No focal fluid collection is identified. There is no significant muscular edema. There is diffuse muscular atrophy.  The anterior extensor, medial flexor, peroneal and Achilles tendons are intact.  The plantar fascia is intact.  IMPRESSION:  1.  Mild lower leg subcutaneous edema consistent with cellulitis. No evidence of soft tissue abscess. 2.  Post-traumatic deformities of the distal tibia and fibula with postsurgical changes related to tibiotalar arthrodesis.  No evidence of osteomyelitis. 3.  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 4.  Diffuse lower leg muscular atrophy.   Original Report Authenticated By: Carey Bullocks, M.D.      1. Cellulitis of ankle   2. Inguinal lymphadenitis       MDM  Sent for iv abx for ankle cellulitis and inguinal adenopathy.        Linna Hoff, MD 01/06/13 [id number]

## 2013-01-06 ENCOUNTER — Encounter (HOSPITAL_COMMUNITY): Payer: Self-pay | Admitting: *Deleted

## 2013-01-06 ENCOUNTER — Inpatient Hospital Stay (HOSPITAL_COMMUNITY): Payer: Medicare PPO

## 2013-01-06 LAB — CBC
HCT: 41 % (ref 39.0–52.0)
Hemoglobin: 14.7 g/dL (ref 13.0–17.0)
MCV: 96.2 fL (ref 78.0–100.0)
Platelets: 125 10*3/uL — ABNORMAL LOW (ref 150–400)
RBC: 4.11 MIL/uL — ABNORMAL LOW (ref 4.22–5.81)
RBC: 4.26 MIL/uL (ref 4.22–5.81)
WBC: 8.7 10*3/uL (ref 4.0–10.5)
WBC: 8.3 10*3/uL (ref 4.0–10.5)

## 2013-01-06 LAB — BASIC METABOLIC PANEL
BUN: 9 mg/dL (ref 6–23)
CO2: 27 mEq/L (ref 19–32)
CO2: 26 mEq/L (ref 19–32)
Chloride: 89 mEq/L — ABNORMAL LOW (ref 96–112)
Chloride: 93 mEq/L — ABNORMAL LOW (ref 96–112)
Creatinine, Ser: 0.63 mg/dL (ref 0.50–1.35)
Glucose, Bld: 130 mg/dL — ABNORMAL HIGH (ref 70–99)
Sodium: 124 mEq/L — ABNORMAL LOW (ref 135–145)

## 2013-01-06 LAB — TSH: TSH: 3.188 u[IU]/mL (ref 0.350–4.500)

## 2013-01-06 LAB — OSMOLALITY: Osmolality: 272 mOsm/kg — ABNORMAL LOW (ref 275–300)

## 2013-01-06 MED ORDER — PIPERACILLIN-TAZOBACTAM 3.375 G IVPB
3.3750 g | Freq: Three times a day (TID) | INTRAVENOUS | Status: DC
  Administered 2013-01-06 – 2013-01-12 (×18): 3.375 g via INTRAVENOUS
  Filled 2013-01-06 (×23): qty 50

## 2013-01-06 MED ORDER — OXYCODONE HCL 5 MG PO TABS
5.0000 mg | ORAL_TABLET | ORAL | Status: DC | PRN
  Administered 2013-01-06 – 2013-01-07 (×4): 10 mg via ORAL
  Filled 2013-01-06 (×4): qty 2

## 2013-01-06 MED ORDER — ENOXAPARIN SODIUM 40 MG/0.4ML ~~LOC~~ SOLN
40.0000 mg | SUBCUTANEOUS | Status: DC
  Administered 2013-01-06 – 2013-01-11 (×6): 40 mg via SUBCUTANEOUS
  Filled 2013-01-06 (×7): qty 0.4

## 2013-01-06 MED ORDER — ACETAMINOPHEN 650 MG RE SUPP: 650.0000 mg | Freq: Four times a day (QID) | RECTAL | Status: DC | PRN

## 2013-01-06 MED ORDER — SODIUM CHLORIDE 0.9 % IV SOLN
INTRAVENOUS | Status: DC
  Administered 2013-01-06: 100 mL/h via INTRAVENOUS
  Administered 2013-01-06 – 2013-01-07 (×2): via INTRAVENOUS

## 2013-01-06 MED ORDER — GADOBENATE DIMEGLUMINE 529 MG/ML IV SOLN
20.0000 mL | Freq: Once | INTRAVENOUS | Status: AC | PRN
  Administered 2013-01-06: 20 mL via INTRAVENOUS

## 2013-01-06 MED ORDER — VANCOMYCIN HCL IN DEXTROSE 1-5 GM/200ML-% IV SOLN
1000.0000 mg | Freq: Three times a day (TID) | INTRAVENOUS | Status: DC
  Administered 2013-01-06: 1000 mg via INTRAVENOUS
  Filled 2013-01-06 (×3): qty 200

## 2013-01-06 MED ORDER — ACETAMINOPHEN 325 MG PO TABS: 650.0000 mg | ORAL_TABLET | Freq: Four times a day (QID) | ORAL | Status: DC | PRN

## 2013-01-06 MED ORDER — ONDANSETRON HCL 4 MG/2ML IJ SOLN: 4.0000 mg | Freq: Four times a day (QID) | INTRAMUSCULAR | Status: DC | PRN

## 2013-01-06 MED ORDER — ONDANSETRON HCL 4 MG PO TABS: 4.0000 mg | ORAL_TABLET | Freq: Four times a day (QID) | ORAL | Status: DC | PRN

## 2013-01-06 MED ORDER — HYDROMORPHONE HCL PF 1 MG/ML IJ SOLN
1.0000 mg | INTRAMUSCULAR | Status: DC | PRN
  Administered 2013-01-06 – 2013-01-07 (×2): 2 mg via INTRAVENOUS
  Administered 2013-01-08: 1 mg via INTRAVENOUS
  Administered 2013-01-08 (×2): 2 mg via INTRAVENOUS
  Administered 2013-01-09 (×2): 1 mg via INTRAVENOUS
  Administered 2013-01-09: 2 mg via INTRAVENOUS
  Administered 2013-01-10 (×4): 1 mg via INTRAVENOUS
  Administered 2013-01-11: 2 mg via INTRAVENOUS
  Administered 2013-01-11 (×2): 1 mg via INTRAVENOUS
  Administered 2013-01-11: 2 mg via INTRAVENOUS
  Administered 2013-01-11 – 2013-01-12 (×3): 1 mg via INTRAVENOUS
  Filled 2013-01-06: qty 1
  Filled 2013-01-06 (×2): qty 2
  Filled 2013-01-06 (×2): qty 1
  Filled 2013-01-06 (×2): qty 2
  Filled 2013-01-06 (×3): qty 1
  Filled 2013-01-06 (×2): qty 2
  Filled 2013-01-06 (×3): qty 1
  Filled 2013-01-06: qty 2
  Filled 2013-01-06 (×2): qty 1
  Filled 2013-01-06: qty 2

## 2013-01-06 MED ORDER — LISINOPRIL 10 MG PO TABS
10.0000 mg | ORAL_TABLET | Freq: Every day | ORAL | Status: DC
  Administered 2013-01-06 – 2013-01-07 (×2): 10 mg via ORAL
  Filled 2013-01-06 (×2): qty 1

## 2013-01-06 MED ORDER — VANCOMYCIN HCL 10 G IV SOLR
1500.0000 mg | Freq: Two times a day (BID) | INTRAVENOUS | Status: DC
  Administered 2013-01-06 – 2013-01-12 (×12): 1500 mg via INTRAVENOUS
  Filled 2013-01-06 (×14): qty 1500

## 2013-01-06 MED ORDER — PIPERACILLIN-TAZOBACTAM 3.375 G IVPB 30 MIN
3.3750 g | Freq: Once | INTRAVENOUS | Status: AC
  Administered 2013-01-06: 3.375 g via INTRAVENOUS
  Filled 2013-01-06: qty 50

## 2013-01-06 MED ORDER — CLONIDINE HCL 0.1 MG PO TABS
0.1000 mg | ORAL_TABLET | Freq: Three times a day (TID) | ORAL | Status: DC
  Administered 2013-01-06 – 2013-01-08 (×6): 0.1 mg via ORAL
  Filled 2013-01-06 (×9): qty 1

## 2013-01-06 MED ORDER — ATENOLOL 50 MG PO TABS
50.0000 mg | ORAL_TABLET | Freq: Every day | ORAL | Status: DC
  Administered 2013-01-06 – 2013-01-12 (×7): 50 mg via ORAL
  Filled 2013-01-06 (×7): qty 1

## 2013-01-06 NOTE — Progress Notes (Addendum)
ANTIBIOTIC CONSULT NOTE - INITIAL  Pharmacy Consult for Vancocin and Zosyn Indication: cellulitis vs osteo  No Known Allergies  Patient Measurements: Height: 5\' 11"  (180.3 cm) Weight: 178 lb 14.4 oz (81.149 kg) IBW/kg (Calculated) : 75.3  Vital Signs: Temp: 98.6 F (37 C) (03/02 0107) Temp src: Oral (03/01 1704) BP: 135/76 mmHg (03/02 0107) Pulse Rate: 70 (03/02 0107)  Labs:  Recent Labs  01/05/13 1711 01/05/13 1825  WBC 9.9  --   HGB 14.8 16.0  PLT 128*  --   CREATININE  --  0.80   Estimated Creatinine Clearance: 104.6 ml/min (by C-G formula based on Cr of 0.8).  Microbiology: No results found for this or any previous visit (from the past 720 hour(s)).  Medical History: Past Medical History  Diagnosis Date  . Hypertension     Medications:  Prescriptions prior to admission  Medication Sig Dispense Refill  . atenolol (TENORMIN) 50 MG tablet Take 50 mg by mouth daily.      . cloNIDine (CATAPRES) 0.1 MG tablet Take 0.1 mg by mouth 3 (three) times daily.      Marland Kitchen lisinopril (PRINIVIL,ZESTRIL) 10 MG tablet Take 10 mg by mouth daily.       Scheduled:  . atenolol  50 mg Oral Daily  . cloNIDine  0.1 mg Oral TID  . enoxaparin (LOVENOX) injection  40 mg Subcutaneous Q24H  . lisinopril  10 mg Oral Daily  . [COMPLETED] oxyCODONE-acetaminophen  2 tablet Oral Once  . piperacillin-tazobactam  3.375 g Intravenous Once  . piperacillin-tazobactam (ZOSYN)  IV  3.375 g Intravenous Q8H  . vancomycin  1,000 mg Intravenous Q8H  . [DISCONTINUED] vancomycin  1,000 mg Intravenous Q12H    Assessment: 62yo male c/o ankle pain with tenderness and swelling since am, Xray cannot r/o osteo, to begin IV ABX for cellulitis vs osteo, awaiting MRI.  Goal of Therapy:  Vancomycin trough level 15-20 mcg/ml (will decrease if osteo r/o)  Plan:  Rec'd vanc 1g in ED; will continue with vancomycin 1g IV Q8H and add Zosyn 3.375g IV Q8H and monitor CBC, Cx, levels prn.  Colleen Can  PharmD BCPS 01/06/2013,1:13 AM  Addendum MRI ruled out osteomyelitis. Will change vanc trough to 10-79mcg/ml and change vancomycin dose to 1500mg  IV q12h   Thank you,  Brett Fairy, PharmD, BCPS 01/06/2013 1:59 PM
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
## 2013-01-07 DIAGNOSIS — R0989 Other specified symptoms and signs involving the circulatory and respiratory systems: Secondary | ICD-10-CM

## 2013-01-07 LAB — BASIC METABOLIC PANEL
BUN: 9 mg/dL (ref 6–23)
Chloride: 99 mEq/L (ref 96–112)
GFR calc Af Amer: >90 mL/min (ref >90)
Potassium: 4.7 mEq/L (ref 3.5–5.1)

## 2013-01-07 LAB — CBC
HCT: 38.7 % — ABNORMAL LOW (ref 39.0–52.0)
Hemoglobin: 13.6 g/dL (ref 13.0–17.0)
RDW: 13.1 % (ref 11.5–15.5)
WBC: 6.4 10*3/uL (ref 4.0–10.5)

## 2013-01-07 MED ORDER — LISINOPRIL 20 MG PO TABS
20.0000 mg | ORAL_TABLET | Freq: Every day | ORAL | Status: DC
  Administered 2013-01-08 – 2013-01-11 (×4): 20 mg via ORAL
  Filled 2013-01-07 (×4): qty 1

## 2013-01-07 MED ORDER — OXYCODONE HCL 5 MG PO TABS
10.0000 mg | ORAL_TABLET | Freq: Four times a day (QID) | ORAL | Status: DC
  Administered 2013-01-07 – 2013-01-09 (×9): 10 mg via ORAL
  Administered 2013-01-09: 5 mg via ORAL
  Administered 2013-01-10 – 2013-01-11 (×6): 10 mg via ORAL
  Filled 2013-01-07 (×8): qty 2
  Filled 2013-01-07: qty 1
  Filled 2013-01-07 (×7): qty 2

## 2013-01-07 NOTE — Progress Notes (Signed)
UR COMPLETED  

## 2013-01-07 NOTE — Progress Notes (Signed)
VASCULAR LAB PRELIMINARY  ARTERIAL  ABI completed:    RIGHT    LEFT    PRESSURE WAVEFORM  PRESSURE WAVEFORM  BRACHIAL 153  Triphasic BRACHIAL 151 Triphasic         AT 175 Triphasic AT 161 Triphasic  PT 152 Triphasic PT 187 Triphasic                  RIGHT LEFT  ABI 1.14 1.22   ABIs and Doppler waveforms are within normal limits bilaterally at rest.  Marirose Deveney, RVS 01/07/2013, 1:28 PM
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
## 2013-01-07 NOTE — ED Provider Notes (Signed)
Medical screening examination/treatment/procedure(s) were conducted as a shared visit with non-physician practitioner(s) and myself.  I personally evaluated the patient during the encounter. Patient with possible lower extremity cellulitis versus osteomyelitis. Unable to get Doppler at this time initial be admitted for IV antibiotics  Juliet Rude. Rubin Payor, MD 01/07/13 [phone]

## 2013-01-08 LAB — CBC
HCT: 39.5 % (ref 39.0–52.0)
Hemoglobin: 13.7 g/dL (ref 13.0–17.0)
MCHC: 34.7 g/dL (ref 30.0–36.0)
WBC: 6.4 10*3/uL (ref 4.0–10.5)

## 2013-01-08 LAB — BASIC METABOLIC PANEL
BUN: 8 mg/dL (ref 6–23)
CO2: 27 mEq/L (ref 19–32)
Chloride: 99 mEq/L (ref 96–112)
Glucose, Bld: 94 mg/dL (ref 70–99)
Potassium: 3.9 mEq/L (ref 3.5–5.1)

## 2013-01-08 MED ORDER — CLONIDINE HCL 0.2 MG PO TABS
0.2000 mg | ORAL_TABLET | Freq: Three times a day (TID) | ORAL | Status: DC
  Administered 2013-01-08 – 2013-01-12 (×12): 0.2 mg via ORAL
  Filled 2013-01-08 (×14): qty 1
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
## 2013-01-09 LAB — BASIC METABOLIC PANEL
BUN: 8 mg/dL (ref 6–23)
Calcium: 9 mg/dL (ref 8.4–10.5)
GFR calc non Af Amer: >90 mL/min (ref >90)
Glucose, Bld: 91 mg/dL (ref 70–99)

## 2013-01-09 LAB — CBC
HCT: 35.1 % — ABNORMAL LOW (ref 39.0–52.0)
Hemoglobin: 12.4 g/dL — ABNORMAL LOW (ref 13.0–17.0)
MCH: 33.4 pg (ref 26.0–34.0)
MCHC: 35.3 g/dL (ref 30.0–36.0)
MCV: 94.6 fL (ref 78.0–100.0)

## 2013-01-09 MED ORDER — BISACODYL 5 MG PO TBEC
10.0000 mg | DELAYED_RELEASE_TABLET | Freq: Once | ORAL | Status: AC
  Administered 2013-01-09: 10 mg via ORAL
  Filled 2013-01-09: qty 2
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
## 2013-01-10 NOTE — Progress Notes (Signed)
Subjective: Pt reports that his pain has decreased a little bit, but the foot is still quite sore when he tries to bear weight.  Objective: Vital signs in last 24 hours: Temp:  [97.5 F (36.4 C)-98 F (36.7 C)] 97.5 F (36.4 C) (03/06 2056) Pulse Rate:  [68-100] 100 (03/06 2056) Resp:  [16-20] 16 (03/06 2056) BP: (140-165)/(89-109) 148/89 mmHg (03/06 2056) SpO2:  [97 %-100 %] 99 % (03/06 2056)  Intake/Output from previous day: 03/05 0701 - 03/06 0700 In: 960 [P.O.:960] Out: -  Intake/Output this shift:     Recent Labs  01/08/13 0535 01/09/13 0542  HGB 13.7 12.4*    Recent Labs  01/08/13 0535 01/09/13 0542  WBC 6.4 4.8  RBC 4.04* 3.71*  HCT 39.5 35.1*  PLT 117* 93*    Recent Labs  01/08/13 0535 01/09/13 0542  NA 135 134*  K 3.9 3.9  CL 99 99  CO2 27 21  BUN 8 8  CREATININE 0.79 0.77  GLUCOSE 94 91  CALCIUM 8.9 9.0    PE:  R ankle with decreased erythema, swelling and warmth.  Still quite TTP about the ankle and particularly on the plantar aspect of the medial heel.  No lymphadenopathy.  5/5 strenght in PF and DF o the toes.  Sens to LT intact at foot plantarly.  Assessment/Plan: R ankle cellulitis - continue observation of response to abx.  He seems to be improving albeit slowly.  No surgical indication or need for additional imaging.  I'll check on him again tomorrow.   Toni Arthurs 01/10/2013, 10:06 PM

## 2013-01-10 NOTE — Progress Notes (Signed)
ANTIBIOTIC CONSULT NOTE - FOLLOW UP  Pharmacy Consult for Vancomycin and Zosyn Indication: cellulitis  No Known Allergies  Patient Measurements: Height: 5\' 11"  (180.3 cm) Weight: 178 lb 14.4 oz (81.149 kg) IBW/kg (Calculated) : 75.3  Vital Signs: Temp: 98 F (36.7 C) (03/06 0555) Temp src: Oral (03/06 0555) BP: 165/90 mmHg (03/06 0555) Pulse Rate: 74 (03/06 0555) Intake/Output from previous day: 03/05 0701 - 03/06 0700 In: 960 [P.O.:960] Out: -  Intake/Output from this shift:    Labs:  Recent Labs  01/08/13 0535 01/09/13 0542  WBC 6.4 4.8  HGB 13.7 12.4*  PLT 117* 93*  CREATININE 0.79 0.77   Estimated Creatinine Clearance: 104.6 ml/min (by C-G formula based on Cr of 0.77).  Recent Labs  01/10/13 0445  VANCOTROUGH 18.1     Microbiology: No results found for this or any previous visit (from the past 720 hour(s)).  Assessment: 61yom continues on day #4 vancomycin and zosyn for RLE cellulitis. No BMET today but renal function has remained stable. Vancomycin trough of 18.1 is above goal but trough drawn about 1h early so true trough actually estimated ~15.5 which is at goal. Zosyn dose is appropriate.  No cultures  3/2 Vancomycin>> 3/2 Zosyn>>  Goal of Therapy:  Vancomycin trough level 10-15 mcg/ml   Plan:  1) Continue vancomycin 1500mg  IV q12 2) Continue zosyn 3.375g IV q8 (4 hour infusion)  Fredrik Rigger 01/10/2013,8:24 AM

## 2013-01-11 DIAGNOSIS — L03119 Cellulitis of unspecified part of limb: Secondary | ICD-10-CM

## 2013-01-11 DIAGNOSIS — L02419 Cutaneous abscess of limb, unspecified: Secondary | ICD-10-CM

## 2013-01-11 LAB — CBC
HCT: 36.4 % — ABNORMAL LOW (ref 39.0–52.0)
Hemoglobin: 12.9 g/dL — ABNORMAL LOW (ref 13.0–17.0)
MCHC: 35.4 g/dL (ref 30.0–36.0)
MCV: 97.6 fL (ref 78.0–100.0)
RDW: 12.8 % (ref 11.5–15.5)

## 2013-01-11 LAB — BASIC METABOLIC PANEL
BUN: 10 mg/dL (ref 6–23)
Chloride: 101 mEq/L (ref 96–112)
Creatinine, Ser: 0.89 mg/dL (ref 0.50–1.35)
GFR calc Af Amer: >90 mL/min (ref >90)
GFR calc non Af Amer: >90 mL/min (ref >90)
Glucose, Bld: 102 mg/dL — ABNORMAL HIGH (ref 70–99)

## 2013-01-11 MED ORDER — LISINOPRIL 40 MG PO TABS
40.0000 mg | ORAL_TABLET | Freq: Every day | ORAL | Status: DC
  Administered 2013-01-12: 40 mg via ORAL
  Filled 2013-01-11: qty 1

## 2013-01-11 MED ORDER — MORPHINE SULFATE ER 15 MG PO TBCR
15.0000 mg | EXTENDED_RELEASE_TABLET | Freq: Two times a day (BID) | ORAL | Status: DC
  Administered 2013-01-11 – 2013-01-12 (×2): 15 mg via ORAL
  Filled 2013-01-11 (×2): qty 1

## 2013-01-11 MED ORDER — OXYCODONE HCL 5 MG PO TABS
5.0000 mg | ORAL_TABLET | ORAL | Status: DC | PRN
  Administered 2013-01-11 – 2013-01-12 (×2): 10 mg via ORAL
  Filled 2013-01-11 (×2): qty 2

## 2013-01-11 MED ORDER — LISINOPRIL 10 MG PO TABS
10.0000 mg | ORAL_TABLET | Freq: Once | ORAL | Status: AC
  Administered 2013-01-11: 10 mg via ORAL
  Filled 2013-01-11: qty 1

## 2013-01-11 NOTE — Progress Notes (Signed)
Subjective: Pt denies any real change in his sxs from my visit yesterday.  Dr. Betti Cruz is here with the pt as well.  No n/v/f/c.  Continued pain with WB.  No pain at rest.  Objective: Vital signs in last 24 hours: Temp:  [97.5 F (36.4 C)-98.7 F (37.1 C)] 98.7 F (37.1 C) (03/07 0458) Pulse Rate:  [68-110] 110 (03/07 0458) Resp:  [16-18] 18 (03/07 0458) BP: (140-169)/(89-109) 169/92 mmHg (03/07 0458) SpO2:  [99 %-100 %] 99 % (03/07 0458)  Intake/Output from previous day: 03/06 0701 - 03/07 0700 In: 960 [P.O.:960] Out: -  Intake/Output this shift:     Recent Labs  01/09/13 0542 01/11/13 0445  HGB 12.4* 12.9*    Recent Labs  01/09/13 0542 01/11/13 0445  WBC 4.8 4.5  RBC 3.71* 3.73*  HCT 35.1* 36.4*  PLT 93* 99*    Recent Labs  01/09/13 0542 01/11/13 0445  NA 134* 137  K 3.9 4.6  CL 99 101  CO2 21 28  BUN 8 10  CREATININE 0.77 0.89  GLUCOSE 91 102*  CALCIUM 9.0 9.0     R LE with erythema that is stable from exam yesterday.  Still TTP atbout the ankle.  Skin intact.  Normal sens to LT plantarly.  5/5 strength in PF and DF of the toes.  Assessment/Plan: Cellulitis of the right ankle - continue IV abx.  Would consider ID consult for duration and possible change to orals for d/c.  I'll sign off for the weekend and check on the pt on Monday if still here.   Toni Arthurs 01/11/2013, 12:41 PM
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
## 2013-01-12 LAB — CBC
HCT: 36.4 % — ABNORMAL LOW (ref 39.0–52.0)
Hemoglobin: 12.8 g/dL — ABNORMAL LOW (ref 13.0–17.0)
MCV: 98.6 fL (ref 78.0–100.0)
RDW: 13 % (ref 11.5–15.5)
WBC: 4.4 10*3/uL (ref 4.0–10.5)

## 2013-01-12 LAB — CREATININE, SERUM
Creatinine, Ser: 0.82 mg/dL (ref 0.50–1.35)
GFR calc Af Amer: >90 mL/min (ref >90)
GFR calc non Af Amer: >90 mL/min (ref >90)

## 2013-01-12 MED ORDER — MORPHINE SULFATE ER 15 MG PO TBCR: 15.0000 mg | EXTENDED_RELEASE_TABLET | Freq: Two times a day (BID) | ORAL | Status: DC

## 2013-01-12 MED ORDER — OXYCODONE HCL 5 MG PO TABS: 5.0000 mg | ORAL_TABLET | Freq: Four times a day (QID) | ORAL | Status: DC | PRN

## 2013-01-12 MED ORDER — LISINOPRIL 40 MG PO TABS: 40.0000 mg | ORAL_TABLET | Freq: Every day | ORAL | Status: DC

## 2013-01-12 MED ORDER — ATENOLOL 50 MG PO TABS: 50.0000 mg | ORAL_TABLET | Freq: Every day | ORAL | Status: DC

## 2013-01-12 MED ORDER — SULFAMETHOXAZOLE-TMP DS 800-160 MG PO TABS: 1.0000 | ORAL_TABLET | Freq: Two times a day (BID) | ORAL | Status: DC

## 2013-01-12 MED ORDER — SULFAMETHOXAZOLE-TMP DS 800-160 MG PO TABS
1.0000 | ORAL_TABLET | Freq: Two times a day (BID) | ORAL | Status: DC
  Administered 2013-01-12: 1 via ORAL
  Filled 2013-01-12 (×2): qty 1

## 2013-01-12 MED ORDER — CLONIDINE HCL 0.2 MG PO TABS: 0.2000 mg | ORAL_TABLET | Freq: Three times a day (TID) | ORAL | Status: DC

## 2013-01-12 MED ORDER — CEPHALEXIN 500 MG PO CAPS: 500.0000 mg | ORAL_CAPSULE | Freq: Four times a day (QID) | ORAL | Status: DC

## 2013-01-12 MED ORDER — CEPHALEXIN 500 MG PO CAPS
500.0000 mg | ORAL_CAPSULE | Freq: Four times a day (QID) | ORAL | Status: DC
  Administered 2013-01-12: 500 mg via ORAL
  Filled 2013-01-12 (×4): qty 1
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
## 2013-03-26 ENCOUNTER — Encounter: Payer: Self-pay | Admitting: Internal Medicine

## 2013-05-15 ENCOUNTER — Ambulatory Visit (INDEPENDENT_AMBULATORY_CARE_PROVIDER_SITE_OTHER): Payer: Medicare PPO | Admitting: Internal Medicine

## 2013-05-15 ENCOUNTER — Other Ambulatory Visit (INDEPENDENT_AMBULATORY_CARE_PROVIDER_SITE_OTHER): Payer: Medicare PPO

## 2013-05-15 ENCOUNTER — Encounter: Payer: Self-pay | Admitting: Internal Medicine

## 2013-05-15 ENCOUNTER — Ambulatory Visit (AMBULATORY_SURGERY_CENTER): Payer: Medicare PPO | Admitting: *Deleted

## 2013-05-15 VITALS — BP 136/78 | HR 75 | Temp 98.2°F | Ht 71.5 in | Wt 227.8 lb

## 2013-05-15 VITALS — Ht 71.0 in | Wt 226.0 lb

## 2013-05-15 DIAGNOSIS — I10 Essential (primary) hypertension: Secondary | ICD-10-CM

## 2013-05-15 DIAGNOSIS — Z1211 Encounter for screening for malignant neoplasm of colon: Secondary | ICD-10-CM

## 2013-05-15 DIAGNOSIS — Z13220 Encounter for screening for lipoid disorders: Secondary | ICD-10-CM

## 2013-05-15 DIAGNOSIS — D649 Anemia, unspecified: Secondary | ICD-10-CM

## 2013-05-15 DIAGNOSIS — E669 Obesity, unspecified: Secondary | ICD-10-CM

## 2013-05-15 DIAGNOSIS — B182 Chronic viral hepatitis C: Secondary | ICD-10-CM

## 2013-05-15 LAB — LIPID PANEL
Cholesterol: 144 mg/dL (ref 0–200)
HDL: 46.9 mg/dL (ref >39.00)
LDL Cholesterol: 82 mg/dL (ref 0–99)
Total CHOL/HDL Ratio: 3
Triglycerides: 77 mg/dL (ref 0.0–149.0)
VLDL: 15.4 mg/dL (ref 0.0–40.0)

## 2013-05-15 LAB — CBC
HCT: 44.8 % (ref 39.0–52.0)
MCHC: 34.2 g/dL (ref 30.0–36.0)
MCV: 101.7 fl — ABNORMAL HIGH (ref 78.0–100.0)
Platelets: 137 10*3/uL — ABNORMAL LOW (ref 150.0–400.0)
RBC: 4.4 Mil/uL (ref 4.22–5.81)

## 2013-05-15 LAB — COMPREHENSIVE METABOLIC PANEL
AST: 198 U/L — ABNORMAL HIGH (ref 0–37)
Alkaline Phosphatase: 152 U/L — ABNORMAL HIGH (ref 39–117)
BUN: 10 mg/dL (ref 6–23)
Creatinine, Ser: 0.7 mg/dL (ref 0.4–1.5)
Glucose, Bld: 104 mg/dL — ABNORMAL HIGH (ref 70–99)
Potassium: 4.4 mEq/L (ref 3.5–5.1)
Total Bilirubin: 1.5 mg/dL — ABNORMAL HIGH (ref 0.3–1.2)

## 2013-05-15 MED ORDER — MOVIPREP 100 G PO SOLR: ORAL | Status: DC

## 2013-05-15 NOTE — Patient Instructions (Signed)
Health Maintenance, Males A healthy lifestyle and preventative care can promote health and wellness.  Maintain regular health, dental, and eye exams.  Eat a healthy diet. Foods like vegetables, fruits, whole grains, low-fat dairy products, and lean protein foods contain the nutrients you need without too many calories. Decrease your intake of foods high in solid fats, added sugars, and salt. Get information about a proper diet from your caregiver, if necessary.  Regular physical exercise is one of the most important things you can do for your health. Most adults should get at least 150 minutes of moderate-intensity exercise (any activity that increases your heart rate and causes you to sweat) each week. In addition, most adults need muscle-strengthening exercises on 2 or more days a week.   Maintain a healthy weight. The body mass index (BMI) is a screening tool to identify possible weight problems. It provides an estimate of body fat based on height and weight. Your caregiver can help determine your BMI, and can help you achieve or maintain a healthy weight. For adults 20 years and older:  A BMI below 18.5 is considered underweight.  A BMI of 18.5 to 24.9 is normal.  A BMI of 25 to 29.9 is considered overweight.  A BMI of 30 and above is considered obese.  Maintain normal blood lipids and cholesterol by exercising and minimizing your intake of saturated fat. Eat a balanced diet with plenty of fruits and vegetables. Blood tests for lipids and cholesterol should begin at age 20 and be repeated every 5 years. If your lipid or cholesterol levels are high, you are over 50, or you are a high risk for heart disease, you may need your cholesterol levels checked more frequently.Ongoing high lipid and cholesterol levels should be treated with medicines, if diet and exercise are not effective.  If you smoke, find out from your caregiver how to quit. If you do not use tobacco, do not start.  If you  choose to drink alcohol, do not exceed 2 drinks per day. One drink is considered to be 12 ounces (355 mL) of beer, 5 ounces (148 mL) of wine, or 1.5 ounces (44 mL) of liquor.  Avoid use of street drugs. Do not share needles with anyone. Ask for help if you need support or instructions about stopping the use of drugs.  High blood pressure causes heart disease and increases the risk of stroke. Blood pressure should be checked at least every 1 to 2 years. Ongoing high blood pressure should be treated with medicines if weight loss and exercise are not effective.  If you are 45 to 61 years old, ask your caregiver if you should take aspirin to prevent heart disease.  Diabetes screening involves taking a blood sample to check your fasting blood sugar level. This should be done once every 3 years, after age 45, if you are within normal weight and without risk factors for diabetes. Testing should be considered at a younger age or be carried out more frequently if you are overweight and have at least 1 risk factor for diabetes.  Colorectal cancer can be detected and often prevented. Most routine colorectal cancer screening begins at the age of 50 and continues through age 75. However, your caregiver may recommend screening at an earlier age if you have risk factors for colon cancer. On a yearly basis, your caregiver may provide home test kits to check for hidden blood in the stool. Use of a small camera at the end of a tube,   to directly examine the colon (sigmoidoscopy or colonoscopy), can detect the earliest forms of colorectal cancer. Talk to your caregiver about this at age 50, when routine screening begins. Direct examination of the colon should be repeated every 5 to 10 years through age 75, unless early forms of pre-cancerous polyps or small growths are found.  Hepatitis C blood testing is recommended for all people born from 1945 through 1965 and any individual with known risks for hepatitis C.  Healthy  men should no longer receive prostate-specific antigen (PSA) blood tests as part of routine cancer screening. Consult with your caregiver about prostate cancer screening.  Testicular cancer screening is not recommended for adolescents or adult males who have no symptoms. Screening includes self-exam, caregiver exam, and other screening tests. Consult with your caregiver about any symptoms you have or any concerns you have about testicular cancer.  Practice safe sex. Use condoms and avoid high-risk sexual practices to reduce the spread of sexually transmitted infections (STIs).  Use sunscreen with a sun protection factor (SPF) of 30 or greater. Apply sunscreen liberally and repeatedly throughout the day. You should seek shade when your shadow is shorter than you. Protect yourself by wearing long sleeves, pants, a wide-brimmed hat, and sunglasses year round, whenever you are outdoors.  Notify your caregiver of new moles or changes in moles, especially if there is a change in shape or color. Also notify your caregiver if a mole is larger than the size of a pencil eraser.  A one-time screening for abdominal aortic aneurysm (AAA) and surgical repair of large AAAs by sound wave imaging (ultrasonography) is recommended for ages 65 to 75 years who are current or former smokers.  Stay current with your immunizations. Document Released: 04/21/2008 Document Revised: 01/16/2012 Document Reviewed: 03/21/2011 [employer] Patient Information 2014 [employer].  

## 2013-05-15 NOTE — Progress Notes (Signed)
HPI  Pt presents to the clinic today to establish care. He does not have a PCP. He goes to Urgent Care as needed. He has no concerns today.  Flu: 2014 Tetanus: 2012 Pneumovax: 2012 Colonoscopy: 2 weeks from now Eye doctor: yearly Dentist: as needed   Past Medical History  Diagnosis Date  . Hypertension   . Osteoarthritis   . Blood transfusion without reported diagnosis     during hip replacement 2012  . Hepatitis C     Current Outpatient Prescriptions  Medication Sig Dispense Refill  . atenolol (TENORMIN) 50 MG tablet Take 1 tablet (50 mg total) by mouth daily.  30 tablet  0  . cloNIDine (CATAPRES) 0.2 MG tablet Take 1 tablet (0.2 mg total) by mouth 3 (three) times daily.  90 tablet  0  . lisinopril (PRINIVIL,ZESTRIL) 40 MG tablet Take 1 tablet (40 mg total) by mouth daily.  30 tablet  0  . MOVIPREP 100 G SOLR moviprep as directed. No substitutions  1 kit  0   No current facility-administered medications for this visit.    No Known Allergies  Family History  Problem Relation Age of Onset  . Colon cancer Neg Hx   . Arthritis Mother   . Hypertension Father   . Arthritis Brother   . Hypertension Brother     History   Social History  . Marital Status: Divorced    Spouse Name: N/A    Number of Children: N/A  . Years of Education: 13   Occupational History  . Retired     Warehouse manager   Social History Main Topics  . Smoking status: Never Smoker   . Smokeless tobacco: Never Used  . Alcohol Use: 4.2 oz/week    7 Cans of beer per week  . Drug Use: No  . Sexually Active: Yes    Birth Control/ Protection: Condom   Other Topics Concern  . Not on file   Social History Narrative   Regular exercise-yes   Caffeine Use-no    ROS:  Constitutional: Denies fever, malaise, fatigue, headache or abrupt weight changes.  HEENT: Denies eye pain, eye redness, ear pain, ringing in the ears, wax buildup, runny nose, nasal congestion, bloody nose, or sore  throat. Respiratory: Denies difficulty breathing, shortness of breath, cough or sputum production.   Cardiovascular: Denies chest pain, chest tightness, palpitations or swelling in the hands or feet.  Gastrointestinal: Denies abdominal pain, bloating, constipation, diarrhea or blood in the stool.  GU: Denies frequency, urgency, pain with urination, blood in urine, odor or discharge. Musculoskeletal: Pt reports multiple joint pains. Denies decrease in range of motion, difficulty with gait, muscle pain.  Skin: Denies redness, rashes, lesions or ulcercations.  Neurological: Denies dizziness, difficulty with memory, difficulty with speech or problems with balance and coordination.   No other specific complaints in a complete review of systems (except as listed in HPI above).  PE:  BP 136/78  Pulse 75  Temp(Src) 98.2 F (36.8 C) (Oral)  Ht 5' 11.5" (1.816 m)  Wt 227 lb 12.8 oz (103.329 kg)  BMI 31.33 kg/m2  SpO2 95% Wt Readings from Last 3 Encounters:  05/15/13 227 lb 12.8 oz (103.329 kg)  05/15/13 226 lb (102.513 kg)  01/06/13 178 lb 14.4 oz (81.149 kg)    General: Appears his stated age, obese  But well developed, well nourished in NAD. HEENT: Head: normal shape and size; Eyes: sclera white, no icterus, conjunctiva pink, PERRLA and EOMs intact; Ears: Tm's gray  and intact, normal light reflex; Nose: mucosa pink and moist, septum midline; Throat/Mouth: Teeth present, mucosa pink and moist, no lesions or ulcerations noted.  Neck: Normal range of motion. Neck supple, trachea midline. No massses, lumps or thyromegaly present.  Cardiovascular: Normal rate and rhythm. S1,S2 noted.  No murmur, rubs or gallops noted. No JVD or BLE edema. No carotid bruits noted. Pulmonary/Chest: Normal effort and positive vesicular breath sounds. No respiratory distress. No wheezes, rales or ronchi noted.  Abdomen: Soft and nontender. Normal bowel sounds, no bruits noted. No distention or masses noted. Liver,  spleen and kidneys non palpable. Musculoskeletal: Normal range of motion. No signs of joint swelling. No difficulty with gait.  Neurological: Alert and oriented. Cranial nerves II-XII intact. Coordination normal. +DTRs bilaterally. Psychiatric: Mood and affect normal. Behavior is normal. Judgment and thought content normal.    Assessment and Plan:  Preventative Health Maintenance:  Encouraged pt to work on diet and exercise Encouraged pt to visit dentist yearly Labs obtained today  RTC in 6 months for HTN folllow up

## 2013-05-15 NOTE — Assessment & Plan Note (Signed)
Well controlled Continue current therapy 

## 2013-05-15 NOTE — Assessment & Plan Note (Signed)
Will check LFT today Call and make follow up appointment with hep c clinic

## 2013-05-16 ENCOUNTER — Encounter: Payer: Self-pay | Admitting: Internal Medicine

## 2013-05-29 ENCOUNTER — Encounter: Payer: Medicare PPO | Admitting: Internal Medicine

## 2013-07-23 ENCOUNTER — Other Ambulatory Visit: Payer: Self-pay | Admitting: *Deleted

## 2013-07-23 MED ORDER — LISINOPRIL 40 MG PO TABS: 40.0000 mg | ORAL_TABLET | Freq: Every day | ORAL | Status: DC

## 2013-07-23 MED ORDER — ATENOLOL 50 MG PO TABS: 50.0000 mg | ORAL_TABLET | Freq: Every day | ORAL | Status: DC

## 2013-07-23 MED ORDER — CLONIDINE HCL 0.2 MG PO TABS: 0.2000 mg | ORAL_TABLET | Freq: Three times a day (TID) | ORAL | Status: DC

## 2013-07-24 ENCOUNTER — Other Ambulatory Visit: Payer: Self-pay

## 2013-12-02 ENCOUNTER — Other Ambulatory Visit: Payer: Self-pay

## 2013-12-02 MED ORDER — CLONIDINE HCL 0.2 MG PO TABS: 0.2000 mg | ORAL_TABLET | Freq: Three times a day (TID) | ORAL | Status: DC

## 2013-12-02 MED ORDER — ATENOLOL 50 MG PO TABS: 50.0000 mg | ORAL_TABLET | Freq: Every day | ORAL | Status: DC

## 2013-12-02 MED ORDER — LISINOPRIL 40 MG PO TABS: 40.0000 mg | ORAL_TABLET | Freq: Every day | ORAL | Status: DC

## 2014-01-03 ENCOUNTER — Other Ambulatory Visit: Payer: Self-pay

## 2014-01-03 ENCOUNTER — Telehealth: Payer: Self-pay

## 2014-01-03 MED ORDER — LISINOPRIL 40 MG PO TABS: 40.0000 mg | ORAL_TABLET | Freq: Every day | ORAL | Status: DC

## 2014-01-03 MED ORDER — CLONIDINE HCL 0.2 MG PO TABS: 0.2000 mg | ORAL_TABLET | Freq: Three times a day (TID) | ORAL | Status: DC

## 2014-01-03 MED ORDER — ATENOLOL 50 MG PO TABS: 50.0000 mg | ORAL_TABLET | Freq: Every day | ORAL | Status: DC

## 2014-01-03 NOTE — Telephone Encounter (Signed)
Pt left v/m; pts insurance requires pt to see MD, can no longer see NP and pt has appt with new MD in couple of weeks and pt is out of med and pts BP is elevated; pt request refill of BP meds to [facility]; pt request cb.

## 2014-01-03 NOTE — Telephone Encounter (Signed)
Pt lmovm asking for someone to return his call

## 2014-02-05 HISTORY — PX: INCISION AND DRAINAGE: SHX5863

## 2014-02-06 ENCOUNTER — Other Ambulatory Visit: Payer: Self-pay | Admitting: Family Medicine

## 2014-02-06 DIAGNOSIS — R0989 Other specified symptoms and signs involving the circulatory and respiratory systems: Secondary | ICD-10-CM

## 2014-02-11 ENCOUNTER — Ambulatory Visit
Admission: RE | Admit: 2014-02-11 | Discharge: 2014-02-11 | Disposition: A | Discharge status: Home / Self Care | Payer: Commercial Managed Care - HMO | Source: Ambulatory Visit | Attending: Family Medicine | Admitting: Family Medicine

## 2014-02-11 DIAGNOSIS — R0989 Other specified symptoms and signs involving the circulatory and respiratory systems: Secondary | ICD-10-CM

## 2014-02-24 ENCOUNTER — Emergency Department (HOSPITAL_COMMUNITY): Payer: Medicare HMO

## 2014-02-24 ENCOUNTER — Inpatient Hospital Stay (HOSPITAL_COMMUNITY)
Admission: EM | Admit: 2014-02-24 | Discharge: 2014-02-25 | DRG: 493 | Disposition: A | Discharge status: Other Acute Inpatient Hospital | Payer: Medicare HMO | Attending: Surgery | Admitting: Surgery

## 2014-02-24 ENCOUNTER — Encounter (HOSPITAL_COMMUNITY): Payer: Self-pay | Admitting: Emergency Medicine

## 2014-02-24 DIAGNOSIS — D62 Acute posthemorrhagic anemia: Secondary | ICD-10-CM | POA: Diagnosis not present

## 2014-02-24 DIAGNOSIS — B1920 Unspecified viral hepatitis C without hepatic coma: Secondary | ICD-10-CM | POA: Diagnosis present

## 2014-02-24 DIAGNOSIS — S82409B Unspecified fracture of shaft of unspecified fibula, initial encounter for open fracture type I or II: Principal | ICD-10-CM

## 2014-02-24 DIAGNOSIS — S82209B Unspecified fracture of shaft of unspecified tibia, initial encounter for open fracture type I or II: Principal | ICD-10-CM | POA: Diagnosis present

## 2014-02-24 DIAGNOSIS — Z79899 Other long term (current) drug therapy: Secondary | ICD-10-CM

## 2014-02-24 DIAGNOSIS — I10 Essential (primary) hypertension: Secondary | ICD-10-CM | POA: Diagnosis present

## 2014-02-24 DIAGNOSIS — S82201B Unspecified fracture of shaft of right tibia, initial encounter for open fracture type I or II: Secondary | ICD-10-CM

## 2014-02-24 DIAGNOSIS — M25579 Pain in unspecified ankle and joints of unspecified foot: Secondary | ICD-10-CM | POA: Diagnosis not present

## 2014-02-24 DIAGNOSIS — F1010 Alcohol abuse, uncomplicated: Secondary | ICD-10-CM | POA: Diagnosis present

## 2014-02-24 DIAGNOSIS — F10129 Alcohol abuse with intoxication, unspecified: Secondary | ICD-10-CM | POA: Diagnosis present

## 2014-02-24 LAB — COMPREHENSIVE METABOLIC PANEL
ALBUMIN: 3.8 g/dL (ref 3.5–5.2)
ALT: 158 U/L — ABNORMAL HIGH (ref 0–53)
AST: 221 U/L — ABNORMAL HIGH (ref 0–37)
Alkaline Phosphatase: 197 U/L — ABNORMAL HIGH (ref 39–117)
BUN: 6 mg/dL (ref 6–23)
CALCIUM: 8.5 mg/dL (ref 8.4–10.5)
CO2: 20 mEq/L (ref 19–32)
Chloride: 96 mEq/L (ref 96–112)
Creatinine, Ser: 0.54 mg/dL (ref 0.50–1.35)
GFR calc Af Amer: >90 mL/min (ref >90)
GFR calc non Af Amer: >90 mL/min (ref >90)
Glucose, Bld: 115 mg/dL — ABNORMAL HIGH (ref 70–99)
POTASSIUM: 4.2 meq/L (ref 3.7–5.3)
Sodium: 134 mEq/L — ABNORMAL LOW (ref 137–147)
TOTAL PROTEIN: 7.5 g/dL (ref 6.0–8.3)
Total Bilirubin: 0.7 mg/dL (ref 0.3–1.2)

## 2014-02-24 LAB — CBC WITH DIFFERENTIAL/PLATELET
BASOS ABS: 0.1 10*3/uL (ref 0.0–0.1)
Basophils Relative: 1 % (ref 0–1)
EOS ABS: 0.3 10*3/uL (ref 0.0–0.7)
Eosinophils Relative: 3 % (ref 0–5)
HEMATOCRIT: 42.1 % (ref 39.0–52.0)
Hemoglobin: 14.8 g/dL (ref 13.0–17.0)
LYMPHS PCT: 55 % — AB (ref 12–46)
Lymphs Abs: 6.1 10*3/uL — ABNORMAL HIGH (ref 0.7–4.0)
MCH: 35.1 pg — AB (ref 26.0–34.0)
MCHC: 35.2 g/dL (ref 30.0–36.0)
MCV: 99.8 fL (ref 78.0–100.0)
MONO ABS: 1 10*3/uL (ref 0.1–1.0)
Monocytes Relative: 9 % (ref 3–12)
NEUTROS ABS: 3.6 10*3/uL (ref 1.7–7.7)
Neutrophils Relative %: 32 % — ABNORMAL LOW (ref 43–77)
Platelets: 165 10*3/uL (ref 150–400)
RBC: 4.22 MIL/uL (ref 4.22–5.81)
RDW: 13.1 % (ref 11.5–15.5)
WBC: 11.1 10*3/uL — ABNORMAL HIGH (ref 4.0–10.5)

## 2014-02-24 LAB — PROTIME-INR
INR: 1.12 (ref 0.00–1.49)
PROTHROMBIN TIME: 14.2 s (ref 11.6–15.2)

## 2014-02-24 LAB — I-STAT CHEM 8, ED
BUN: 4 mg/dL — ABNORMAL LOW (ref 6–23)
CALCIUM ION: 0.98 mmol/L — AB (ref 1.13–1.30)
CHLORIDE: 97 meq/L (ref 96–112)
Creatinine, Ser: 0.9 mg/dL (ref 0.50–1.35)
GLUCOSE: 118 mg/dL — AB (ref 70–99)
HCT: 47 % (ref 39.0–52.0)
Hemoglobin: 16 g/dL (ref 13.0–17.0)
Potassium: 3.9 mEq/L (ref 3.7–5.3)
Sodium: 134 mEq/L — ABNORMAL LOW (ref 137–147)
TCO2: 23 mmol/L (ref 0–100)

## 2014-02-24 LAB — ETHANOL: Alcohol, Ethyl (B): 271 mg/dL — ABNORMAL HIGH (ref 0–11)

## 2014-02-24 MED ORDER — SODIUM CHLORIDE 0.9 % IV SOLN
1000.0000 mL | INTRAVENOUS | Status: DC
  Administered 2014-02-24: 1000 mL via INTRAVENOUS

## 2014-02-24 MED ORDER — SODIUM CHLORIDE 0.9 % IV SOLN
1000.0000 mL | Freq: Once | INTRAVENOUS | Status: AC
  Administered 2014-02-24: 1000 mL via INTRAVENOUS

## 2014-02-24 MED ORDER — TETANUS-DIPHTH-ACELL PERTUSSIS 5-2.5-18.5 LF-MCG/0.5 IM SUSP
0.5000 mL | Freq: Once | INTRAMUSCULAR | Status: AC
  Administered 2014-02-24: 0.5 mL via INTRAMUSCULAR
  Filled 2014-02-24: qty 0.5

## 2014-02-24 MED ORDER — HYDROMORPHONE HCL PF 1 MG/ML IJ SOLN
1.0000 mg | Freq: Once | INTRAMUSCULAR | Status: AC
  Administered 2014-02-24: 1 mg via INTRAVENOUS

## 2014-02-24 MED ORDER — CEFAZOLIN SODIUM-DEXTROSE 2-3 GM-% IV SOLR
2.0000 g | Freq: Once | INTRAVENOUS | Status: AC
  Administered 2014-02-24: 2 g via INTRAVENOUS
  Filled 2014-02-24: qty 50

## 2014-02-24 MED ORDER — MORPHINE SULFATE 4 MG/ML IJ SOLN
4.0000 mg | Freq: Once | INTRAMUSCULAR | Status: AC
  Administered 2014-02-24: 4 mg via INTRAVENOUS
  Filled 2014-02-24: qty 1

## 2014-02-24 MED ORDER — HYDROMORPHONE HCL PF 1 MG/ML IJ SOLN
1.0000 mg | Freq: Once | INTRAMUSCULAR | Status: AC
  Administered 2014-02-24: 1 mg via INTRAVENOUS
  Filled 2014-02-24: qty 1

## 2014-02-24 MED ORDER — IOHEXOL 300 MG/ML  SOLN
100.0000 mL | Freq: Once | INTRAMUSCULAR | Status: AC | PRN
  Administered 2014-02-24: 100 mL via INTRAVENOUS

## 2014-02-24 MED ORDER — HYDROMORPHONE HCL PF 1 MG/ML IJ SOLN
INTRAMUSCULAR | Status: AC
  Filled 2014-02-24: qty 1

## 2014-02-24 MED ORDER — HYDROMORPHONE HCL PF 1 MG/ML IJ SOLN
1.0000 mg | Freq: Once | INTRAMUSCULAR | Status: AC
Start: 2014-02-24 — End: 2014-02-24
  Administered 2014-02-24: 1 mg via INTRAVENOUS

## 2014-02-24 NOTE — ED Notes (Signed)
Patient transported to CT with this RN 
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
## 2014-02-24 NOTE — ED Notes (Signed)
Pt shivering and states he is very cold.
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
## 2014-02-24 NOTE — ED Notes (Signed)
Pt placed on 2L 02, pt's 02 sats reading 88% on RA. Pt's 02 sats reading 98% on 2L 02.

## 2014-02-25 ENCOUNTER — Inpatient Hospital Stay: Admit: 2014-02-25 | Payer: Self-pay | Admitting: Orthopaedic Surgery

## 2014-02-25 ENCOUNTER — Encounter (HOSPITAL_COMMUNITY): Payer: Self-pay | Admitting: Anesthesiology

## 2014-02-25 ENCOUNTER — Encounter (HOSPITAL_COMMUNITY)
Admission: EM | Disposition: A | Discharge status: Other Acute Inpatient Hospital | Payer: Self-pay | Source: Home / Self Care

## 2014-02-25 ENCOUNTER — Encounter (HOSPITAL_COMMUNITY): Payer: Medicare HMO | Admitting: Anesthesiology

## 2014-02-25 ENCOUNTER — Inpatient Hospital Stay (HOSPITAL_COMMUNITY): Payer: Medicare HMO | Admitting: Anesthesiology

## 2014-02-25 ENCOUNTER — Inpatient Hospital Stay (HOSPITAL_COMMUNITY): Payer: Medicare HMO

## 2014-02-25 DIAGNOSIS — D62 Acute posthemorrhagic anemia: Secondary | ICD-10-CM | POA: Diagnosis not present

## 2014-02-25 DIAGNOSIS — S82409B Unspecified fracture of shaft of unspecified fibula, initial encounter for open fracture type I or II: Secondary | ICD-10-CM | POA: Diagnosis present

## 2014-02-25 DIAGNOSIS — F10129 Alcohol abuse with intoxication, unspecified: Secondary | ICD-10-CM | POA: Diagnosis present

## 2014-02-25 DIAGNOSIS — Z79899 Other long term (current) drug therapy: Secondary | ICD-10-CM | POA: Diagnosis not present

## 2014-02-25 DIAGNOSIS — I10 Essential (primary) hypertension: Secondary | ICD-10-CM | POA: Diagnosis present

## 2014-02-25 DIAGNOSIS — B1920 Unspecified viral hepatitis C without hepatic coma: Secondary | ICD-10-CM | POA: Diagnosis present

## 2014-02-25 DIAGNOSIS — M1990 Unspecified osteoarthritis, unspecified site: Secondary | ICD-10-CM | POA: Insufficient documentation

## 2014-02-25 DIAGNOSIS — S82209B Unspecified fracture of shaft of unspecified tibia, initial encounter for open fracture type I or II: Secondary | ICD-10-CM | POA: Diagnosis present

## 2014-02-25 DIAGNOSIS — F1010 Alcohol abuse, uncomplicated: Secondary | ICD-10-CM | POA: Diagnosis present

## 2014-02-25 DIAGNOSIS — M25579 Pain in unspecified ankle and joints of unspecified foot: Secondary | ICD-10-CM | POA: Diagnosis present

## 2014-02-25 HISTORY — PX: EXTERNAL FIXATION LEG: SHX1549

## 2014-02-25 LAB — CBC
HEMATOCRIT: 27.9 % — AB (ref 39.0–52.0)
HEMATOCRIT: 26.3 % — AB (ref 39.0–52.0)
Hemoglobin: 9.8 g/dL — ABNORMAL LOW (ref 13.0–17.0)
Hemoglobin: 9.1 g/dL — ABNORMAL LOW (ref 13.0–17.0)
MCH: 35.5 pg — ABNORMAL HIGH (ref 26.0–34.0)
MCH: 34.5 pg — ABNORMAL HIGH (ref 26.0–34.0)
MCHC: 35.1 g/dL (ref 30.0–36.0)
MCHC: 34.6 g/dL (ref 30.0–36.0)
MCV: 101.1 fL — AB (ref 78.0–100.0)
MCV: 99.6 fL (ref 78.0–100.0)
Platelets: 140 10*3/uL — ABNORMAL LOW (ref 150–400)
Platelets: 147 10*3/uL — ABNORMAL LOW (ref 150–400)
RBC: 2.76 MIL/uL — AB (ref 4.22–5.81)
RBC: 2.64 MIL/uL — AB (ref 4.22–5.81)
RDW: 13.4 % (ref 11.5–15.5)
RDW: 13.5 % (ref 11.5–15.5)
WBC: 12.3 10*3/uL — ABNORMAL HIGH (ref 4.0–10.5)
WBC: 10.6 10*3/uL — AB (ref 4.0–10.5)

## 2014-02-25 LAB — COMPREHENSIVE METABOLIC PANEL
ALK PHOS: 126 U/L — AB (ref 39–117)
ALT: 115 U/L — AB (ref 0–53)
AST: 162 U/L — AB (ref 0–37)
Albumin: 2.8 g/dL — ABNORMAL LOW (ref 3.5–5.2)
BILIRUBIN TOTAL: 0.7 mg/dL (ref 0.3–1.2)
BUN: 8 mg/dL (ref 6–23)
CHLORIDE: 102 meq/L (ref 96–112)
CO2: 16 meq/L — AB (ref 19–32)
Calcium: 7.3 mg/dL — ABNORMAL LOW (ref 8.4–10.5)
Creatinine, Ser: 0.84 mg/dL (ref 0.50–1.35)
GLUCOSE: 193 mg/dL — AB (ref 70–99)
POTASSIUM: 4.7 meq/L (ref 3.7–5.3)
SODIUM: 134 meq/L — AB (ref 137–147)
Total Protein: 5.3 g/dL — ABNORMAL LOW (ref 6.0–8.3)

## 2014-02-25 LAB — APTT: APTT: 35 s (ref 24–37)

## 2014-02-25 LAB — PROTIME-INR
INR: 1.48 (ref 0.00–1.49)
Prothrombin Time: 17.5 seconds — ABNORMAL HIGH (ref 11.6–15.2)

## 2014-02-25 SURGERY — EXTERNAL FIXATION, LOWER EXTREMITY
Anesthesia: General | Site: Leg Lower | Laterality: Right

## 2014-02-25 MED ORDER — ONDANSETRON HCL 4 MG/2ML IJ SOLN: 4.0000 mg | Freq: Four times a day (QID) | INTRAMUSCULAR | Status: DC | PRN

## 2014-02-25 MED ORDER — GLYCOPYRROLATE 0.2 MG/ML IJ SOLN
INTRAMUSCULAR | Status: DC | PRN
  Administered 2014-02-25: 0.6 mg via INTRAVENOUS

## 2014-02-25 MED ORDER — DEXAMETHASONE SODIUM PHOSPHATE 10 MG/ML IJ SOLN
INTRAMUSCULAR | Status: DC | PRN
  Administered 2014-02-25: 10 mg via INTRAVENOUS

## 2014-02-25 MED ORDER — DIPHENHYDRAMINE HCL 12.5 MG/5ML PO ELIX
12.5000 mg | ORAL_SOLUTION | Freq: Four times a day (QID) | ORAL | Status: DC | PRN
  Filled 2014-02-25: qty 5

## 2014-02-25 MED ORDER — PROPOFOL 10 MG/ML IV BOLUS
INTRAVENOUS | Status: DC | PRN
  Administered 2014-02-25: 190 mg via INTRAVENOUS

## 2014-02-25 MED ORDER — LORAZEPAM 2 MG/ML IJ SOLN: 1.0000 mg | Freq: Four times a day (QID) | INTRAMUSCULAR | Status: DC | PRN

## 2014-02-25 MED ORDER — FENTANYL CITRATE 0.05 MG/ML IJ SOLN
INTRAMUSCULAR | Status: AC
  Filled 2014-02-25: qty 5

## 2014-02-25 MED ORDER — LORAZEPAM 2 MG/ML IJ SOLN: 0.0000 mg | Freq: Four times a day (QID) | INTRAMUSCULAR | Status: DC

## 2014-02-25 MED ORDER — HYDROMORPHONE HCL PF 1 MG/ML IJ SOLN: 0.2500 mg | INTRAMUSCULAR | Status: DC | PRN

## 2014-02-25 MED ORDER — GENTAMICIN SULFATE 40 MG/ML IJ SOLN
520.0000 mg | INTRAVENOUS | Status: DC
  Administered 2014-02-25: 520 mg via INTRAVENOUS
  Filled 2014-02-25 (×2): qty 13

## 2014-02-25 MED ORDER — DEXAMETHASONE SODIUM PHOSPHATE 10 MG/ML IJ SOLN
INTRAMUSCULAR | Status: AC
  Filled 2014-02-25: qty 1

## 2014-02-25 MED ORDER — MIDAZOLAM HCL 2 MG/2ML IJ SOLN
INTRAMUSCULAR | Status: AC
  Filled 2014-02-25: qty 2

## 2014-02-25 MED ORDER — MORPHINE SULFATE 2 MG/ML IJ SOLN: 2.0000 mg | INTRAMUSCULAR | Status: DC | PRN

## 2014-02-25 MED ORDER — 0.9 % SODIUM CHLORIDE (POUR BTL) OPTIME
TOPICAL | Status: DC | PRN
  Administered 2014-02-25: 1000 mL

## 2014-02-25 MED ORDER — LIDOCAINE HCL (CARDIAC) 20 MG/ML IV SOLN
INTRAVENOUS | Status: AC
  Filled 2014-02-25: qty 10

## 2014-02-25 MED ORDER — HYDROCODONE-ACETAMINOPHEN 5-325 MG PO TABS: 1.0000 | ORAL_TABLET | ORAL | Status: DC | PRN

## 2014-02-25 MED ORDER — ARTIFICIAL TEARS OP OINT
TOPICAL_OINTMENT | OPHTHALMIC | Status: DC | PRN
  Administered 2014-02-25: 1 via OPHTHALMIC

## 2014-02-25 MED ORDER — SODIUM CHLORIDE 0.9 % IV BOLUS (SEPSIS)
1000.0000 mL | Freq: Once | INTRAVENOUS | Status: AC
  Administered 2014-02-25: 1000 mL via INTRAVENOUS

## 2014-02-25 MED ORDER — KCL IN DEXTROSE-NACL 20-5-0.45 MEQ/L-%-% IV SOLN
INTRAVENOUS | Status: DC
  Administered 2014-02-25: 07:00:00 via INTRAVENOUS
  Filled 2014-02-25 (×3): qty 1000

## 2014-02-25 MED ORDER — ENOXAPARIN SODIUM 40 MG/0.4ML ~~LOC~~ SOLN: 40.0000 mg | SUBCUTANEOUS | Status: DC

## 2014-02-25 MED ORDER — ONDANSETRON HCL 4 MG/2ML IJ SOLN
INTRAMUSCULAR | Status: DC | PRN
  Administered 2014-02-25 (×2): 4 mg via INTRAVENOUS

## 2014-02-25 MED ORDER — OXYCODONE HCL 5 MG PO TABS: 5.0000 mg | ORAL_TABLET | ORAL | Status: DC | PRN

## 2014-02-25 MED ORDER — HYDROMORPHONE HCL PF 1 MG/ML IJ SOLN
1.0000 mg | INTRAMUSCULAR | Status: DC | PRN
  Administered 2014-02-25: 1 mg via INTRAVENOUS
  Filled 2014-02-25: qty 1

## 2014-02-25 MED ORDER — BETHANECHOL CHLORIDE 25 MG PO TABS
25.0000 mg | ORAL_TABLET | Freq: Four times a day (QID) | ORAL | Status: DC
  Filled 2014-02-25 (×2): qty 1

## 2014-02-25 MED ORDER — TAMSULOSIN HCL 0.4 MG PO CAPS
0.8000 mg | ORAL_CAPSULE | Freq: Every day | ORAL | Status: DC
  Filled 2014-02-25: qty 2

## 2014-02-25 MED ORDER — HYDROMORPHONE 0.3 MG/ML IV SOLN
INTRAVENOUS | Status: AC
  Filled 2014-02-25: qty 25

## 2014-02-25 MED ORDER — SODIUM CHLORIDE 0.9 % IV SOLN
10.0000 mg | INTRAVENOUS | Status: DC | PRN
  Administered 2014-02-25: 25 ug/min via INTRAVENOUS

## 2014-02-25 MED ORDER — SODIUM CHLORIDE 0.9 % IR SOLN
Status: DC | PRN
  Administered 2014-02-25: 3000 mL
  Administered 2014-02-25 (×3): 1000 mL

## 2014-02-25 MED ORDER — LIDOCAINE HCL (CARDIAC) 20 MG/ML IV SOLN
INTRAVENOUS | Status: DC | PRN
  Administered 2014-02-25: 40 mg via INTRAVENOUS
  Administered 2014-02-25: 60 mg via INTRAVENOUS

## 2014-02-25 MED ORDER — CLONIDINE HCL 0.2 MG PO TABS
0.2000 mg | ORAL_TABLET | Freq: Three times a day (TID) | ORAL | Status: DC
  Filled 2014-02-25 (×3): qty 1

## 2014-02-25 MED ORDER — ENOXAPARIN SODIUM 30 MG/0.3ML ~~LOC~~ SOLN
30.0000 mg | Freq: Two times a day (BID) | SUBCUTANEOUS | Status: DC
  Filled 2014-02-25 (×2): qty 0.3

## 2014-02-25 MED ORDER — MIDAZOLAM HCL 5 MG/5ML IJ SOLN
INTRAMUSCULAR | Status: DC | PRN
  Administered 2014-02-25: 2 mg via INTRAVENOUS

## 2014-02-25 MED ORDER — ADULT MULTIVITAMIN W/MINERALS CH
1.0000 | ORAL_TABLET | Freq: Every day | ORAL | Status: DC
  Administered 2014-02-25: 1 via ORAL
  Filled 2014-02-25: qty 1

## 2014-02-25 MED ORDER — OXYCODONE HCL 5 MG/5ML PO SOLN: 5.0000 mg | Freq: Once | ORAL | Status: DC | PRN

## 2014-02-25 MED ORDER — HYDROMORPHONE 0.3 MG/ML IV SOLN
INTRAVENOUS | Status: DC
  Administered 2014-02-25: 2.7 mg via INTRAVENOUS
  Administered 2014-02-25: 0.6 mg via INTRAVENOUS
  Administered 2014-02-25: 04:00:00 via INTRAVENOUS
  Administered 2014-02-25: 0.3 mg via INTRAVENOUS

## 2014-02-25 MED ORDER — ATENOLOL 50 MG PO TABS
50.0000 mg | ORAL_TABLET | Freq: Every day | ORAL | Status: DC
  Filled 2014-02-25: qty 1

## 2014-02-25 MED ORDER — PANTOPRAZOLE SODIUM 40 MG PO TBEC
40.0000 mg | DELAYED_RELEASE_TABLET | Freq: Every day | ORAL | Status: DC
  Administered 2014-02-25: 40 mg via ORAL
  Filled 2014-02-25: qty 1

## 2014-02-25 MED ORDER — LACTATED RINGERS IV SOLN
INTRAVENOUS | Status: DC | PRN
  Administered 2014-02-25: 02:00:00 via INTRAVENOUS

## 2014-02-25 MED ORDER — VITAMIN B-1 100 MG PO TABS
100.0000 mg | ORAL_TABLET | Freq: Every day | ORAL | Status: DC
  Administered 2014-02-25: 100 mg via ORAL
  Filled 2014-02-25: qty 1

## 2014-02-25 MED ORDER — NEOSTIGMINE METHYLSULFATE 1 MG/ML IJ SOLN
INTRAMUSCULAR | Status: DC | PRN
  Administered 2014-02-25: 5 mg via INTRAVENOUS

## 2014-02-25 MED ORDER — FOLIC ACID 1 MG PO TABS
1.0000 mg | ORAL_TABLET | Freq: Every day | ORAL | Status: DC
Start: 2014-02-25 — End: 2014-02-25
  Administered 2014-02-25: 1 mg via ORAL
  Filled 2014-02-25: qty 1

## 2014-02-25 MED ORDER — NALOXONE HCL 0.4 MG/ML IJ SOLN: 0.4000 mg | INTRAMUSCULAR | Status: DC | PRN

## 2014-02-25 MED ORDER — ONDANSETRON HCL 4 MG/2ML IJ SOLN
INTRAMUSCULAR | Status: AC
  Filled 2014-02-25: qty 2

## 2014-02-25 MED ORDER — DOCUSATE SODIUM 100 MG PO CAPS: 100.0000 mg | ORAL_CAPSULE | Freq: Two times a day (BID) | ORAL | Status: DC

## 2014-02-25 MED ORDER — SUCCINYLCHOLINE CHLORIDE 20 MG/ML IJ SOLN
INTRAMUSCULAR | Status: DC | PRN
  Administered 2014-02-25: 120 mg via INTRAVENOUS

## 2014-02-25 MED ORDER — PANTOPRAZOLE SODIUM 40 MG IV SOLR
40.0000 mg | Freq: Every day | INTRAVENOUS | Status: DC
  Filled 2014-02-25: qty 40

## 2014-02-25 MED ORDER — OXYCODONE HCL 5 MG PO TABS: 5.0000 mg | ORAL_TABLET | Freq: Once | ORAL | Status: DC | PRN

## 2014-02-25 MED ORDER — EPHEDRINE SULFATE 50 MG/ML IJ SOLN
INTRAMUSCULAR | Status: DC | PRN
  Administered 2014-02-25 (×2): 5 mg via INTRAVENOUS

## 2014-02-25 MED ORDER — METHOCARBAMOL 500 MG PO TABS
500.0000 mg | ORAL_TABLET | Freq: Four times a day (QID) | ORAL | Status: DC | PRN
  Filled 2014-02-25: qty 1

## 2014-02-25 MED ORDER — ARTIFICIAL TEARS OP OINT
TOPICAL_OINTMENT | OPHTHALMIC | Status: AC
  Filled 2014-02-25: qty 3.5

## 2014-02-25 MED ORDER — CEFAZOLIN SODIUM 1-5 GM-% IV SOLN
1.0000 g | Freq: Three times a day (TID) | INTRAVENOUS | Status: DC
  Administered 2014-02-25 (×2): 1 g via INTRAVENOUS
  Filled 2014-02-25 (×4): qty 50

## 2014-02-25 MED ORDER — VECURONIUM BROMIDE 10 MG IV SOLR
INTRAVENOUS | Status: DC | PRN
  Administered 2014-02-25: 4 mg via INTRAVENOUS

## 2014-02-25 MED ORDER — THIAMINE HCL 100 MG/ML IJ SOLN
100.0000 mg | Freq: Every day | INTRAMUSCULAR | Status: DC
  Filled 2014-02-25: qty 1

## 2014-02-25 MED ORDER — FENTANYL CITRATE 0.05 MG/ML IJ SOLN
INTRAMUSCULAR | Status: DC | PRN
  Administered 2014-02-25: 100 ug via INTRAVENOUS
  Administered 2014-02-25 (×3): 50 ug via INTRAVENOUS

## 2014-02-25 MED ORDER — PHENYLEPHRINE 40 MCG/ML (10ML) SYRINGE FOR IV PUSH (FOR BLOOD PRESSURE SUPPORT)
PREFILLED_SYRINGE | INTRAVENOUS | Status: AC
  Filled 2014-02-25: qty 20

## 2014-02-25 MED ORDER — DIPHENHYDRAMINE HCL 50 MG/ML IJ SOLN: 12.5000 mg | Freq: Four times a day (QID) | INTRAMUSCULAR | Status: DC | PRN

## 2014-02-25 MED ORDER — SODIUM CHLORIDE 0.9 % IJ SOLN: 9.0000 mL | INTRAMUSCULAR | Status: DC | PRN

## 2014-02-25 MED ORDER — POLYETHYLENE GLYCOL 3350 17 G PO PACK
17.0000 g | PACK | Freq: Every day | ORAL | Status: DC
  Filled 2014-02-25: qty 1

## 2014-02-25 MED ORDER — SODIUM CHLORIDE 0.9 % IV SOLN
INTRAVENOUS | Status: DC | PRN
  Administered 2014-02-25 (×2): via INTRAVENOUS

## 2014-02-25 MED ORDER — LORAZEPAM 2 MG/ML IJ SOLN: 0.0000 mg | Freq: Two times a day (BID) | INTRAMUSCULAR | Status: DC

## 2014-02-25 MED ORDER — LORAZEPAM 1 MG PO TABS: 1.0000 mg | ORAL_TABLET | Freq: Four times a day (QID) | ORAL | Status: DC | PRN

## 2014-02-25 MED ORDER — METOCLOPRAMIDE HCL 5 MG/ML IJ SOLN
INTRAMUSCULAR | Status: AC
  Filled 2014-02-25: qty 2

## 2014-02-25 SURGICAL SUPPLY — 53 items
BANDAGE GAUZE ELAST BULKY 4 IN (GAUZE/BANDAGES/DRESSINGS) ×1 IMPLANT
BAR EXFIX SM BONE 10.5X200 (Trauma) ×1 IMPLANT
BAR EXFIX SM BONE 10.5X400 (Trauma) ×2 IMPLANT
BAR EXFX 400 NS DISP CFBR (Trauma) ×2 IMPLANT
BIT DRILL 3.5 SHOFT HALF PIN (BIT) ×1 IMPLANT
BNDG COHESIVE 6X5 TAN STRL LF (GAUZE/BANDAGES/DRESSINGS) ×3 IMPLANT
CANISTER WOUND CARE 500ML ATS (WOUND CARE) ×2 IMPLANT
CLAMP PIN TO BAR (Clamp) ×4 IMPLANT
COVER SURGICAL LIGHT HANDLE (MISCELLANEOUS) ×2 IMPLANT
CUFF TOURNIQUET SINGLE 34IN LL (TOURNIQUET CUFF) ×1 IMPLANT
DRAPE C-ARM 42X72 X-RAY (DRAPES) ×1 IMPLANT
DRAPE C-ARMOR (DRAPES) ×1 IMPLANT
DRAPE INCISE IOBAN 66X45 STRL (DRAPES) ×1 IMPLANT
DRAPE U-SHAPE 47X51 STRL (DRAPES) ×2 IMPLANT
DRSG ADAPTIC 3X8 NADH LF (GAUZE/BANDAGES/DRESSINGS) ×1 IMPLANT
DRSG VAC ATS MED SENSATRAC (GAUZE/BANDAGES/DRESSINGS) ×1 IMPLANT
ELECT REM PT RETURN 9FT ADLT (ELECTROSURGICAL) ×2
ELECTRODE REM PT RTRN 9FT ADLT (ELECTROSURGICAL) ×1 IMPLANT
FACESHIELD STD STERILE (MASK) ×1 IMPLANT
GAUZE XEROFORM 1X8 LF (GAUZE/BANDAGES/DRESSINGS) ×3 IMPLANT
GLOVE BIO SURGEON STRL SZ7 (GLOVE) ×1 IMPLANT
GLOVE BIO SURGEON STRL SZ7.5 (GLOVE) ×2 IMPLANT
GLOVE BIOGEL PI IND STRL 7.5 (GLOVE) IMPLANT
GLOVE BIOGEL PI IND STRL 8 (GLOVE) IMPLANT
GLOVE BIOGEL PI INDICATOR 7.5 (GLOVE) ×1
GLOVE BIOGEL PI INDICATOR 8 (GLOVE) ×1
GLOVE SURG SS PI 7.5 STRL IVOR (GLOVE) ×7 IMPLANT
GOWN STRL REUS W/ TWL LRG LVL3 (GOWN DISPOSABLE) ×1 IMPLANT
GOWN STRL REUS W/ TWL XL LVL3 (GOWN DISPOSABLE) ×1 IMPLANT
GOWN STRL REUS W/TWL LRG LVL3 (GOWN DISPOSABLE) ×4
GOWN STRL REUS W/TWL XL LVL3 (GOWN DISPOSABLE) ×2
HANDPIECE INTERPULSE COAX TIP (DISPOSABLE) ×2
JETX BAR TO BAR CLAMP MR SAFE (Orthopedic Implant) ×2 IMPLANT
KIT BASIN OR (CUSTOM PROCEDURE TRAY) ×2 IMPLANT
KIT ROOM TURNOVER OR (KITS) ×2 IMPLANT
MANIFOLD NEPTUNE II (INSTRUMENTS) ×2 IMPLANT
MARKER SKIN DUAL TIP RULER LAB (MISCELLANEOUS) ×1 IMPLANT
NS IRRIG 1000ML POUR BTL (IV SOLUTION) ×3 IMPLANT
PACK PERIPHERAL VASCULAR (CUSTOM PROCEDURE TRAY) ×1 IMPLANT
PAD ARMBOARD 7.5X6 YLW CONV (MISCELLANEOUS) ×3 IMPLANT
PIN  CAPS ×2 IMPLANT
PIN EX-FIX LEG 5X45 (PIN) ×2 IMPLANT
PIN TRACTION 5X50 (EXFIX) ×1 IMPLANT
SET HNDPC FAN SPRY TIP SCT (DISPOSABLE) ×1 IMPLANT
SET IRRIG Y TYPE TUR BLADDER L (SET/KITS/TRAYS/PACK) ×1 IMPLANT
SPONGE LAP 18X18 X RAY DECT (DISPOSABLE) ×4 IMPLANT
STOCKINETTE IMPERVIOUS 9X36 MD (GAUZE/BANDAGES/DRESSINGS) ×2 IMPLANT
TOWEL OR 17X24 6PK STRL BLUE (TOWEL DISPOSABLE) ×3 IMPLANT
TOWEL OR 17X26 10 PK STRL BLUE (TOWEL DISPOSABLE) ×2 IMPLANT
TUBE CONNECTING 12X1/4 (SUCTIONS) ×2 IMPLANT
TUBING CYSTO DISP (UROLOGICAL SUPPLIES) ×2 IMPLANT
UNDERPAD 30X30 INCONTINENT (UNDERPADS AND DIAPERS) ×5 IMPLANT
YANKAUER SUCT BULB TIP NO VENT (SUCTIONS) ×2 IMPLANT

## 2014-02-25 NOTE — Progress Notes (Signed)
Called unit and spoke with assigned RN, Lilia Pro, about potential transfer of pt to another hospital.  Advised her of the reports that will need printed as well as other steps involved in transfer.  She stated understanding.   Trauma medical team will locate appropriate medical center and accepting MD.  When that facility has a bed available, they will contact the unit to begin transfer process.  RN will need to print: 1. The Full Transfer Report 2. The EMS/[facility] Transport Report 3.  Complete and print the Medical Necessity Transport Form 4.  Call Radiology to have all imaging placed on disk for transfer ([phone]) 5.  Call [facility] w/ accepting MD's first and last name, hospital name, phone number to call report and room number. 6.  Print the EMTALA form (PA/MD will have completed it, RN has pt or family member sign it as permission to transfer.  Sandi Mariscal, RN BSN MHA CCM  Case Manager, Trauma Service/Unit 31M [phone]

## 2014-02-25 NOTE — Transfer of Care (Signed)
Immediate Anesthesia Transfer of Care Note  Patient: Joshua Burton  Procedure(s) Performed: Procedure(s): EXTERNAL FIXATION LEG (Right)  Patient Location: PACU  Anesthesia Type:General  Level of Consciousness: oriented, sedated, patient cooperative and responds to stimulation  Airway & Oxygen Therapy: Patient Spontanous Breathing and Patient connected to nasal cannula oxygen  Post-op Assessment: Report given to PACU RN, Post -op Vital signs reviewed and stable, Patient moving all extremities and Patient moving all extremities X 4  Post vital signs: Reviewed and stable  Complications: No apparent anesthesia complications

## 2014-02-25 NOTE — Progress Notes (Signed)
[place] Notified Dr. Erlinda Hong that wound vac was blocked and changing the canister was unsuccessful.   1215 Dr. Erlinda Hong arrived to room and partially changed wound vac dressing. Suction returned to mmHg.  [place] Michael PA that Dr. Erlinda Hong plans to transfer pt for plastics sx consult.
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
## 2014-02-25 NOTE — Progress Notes (Signed)
Dr Erlinda Hong in to see pt. Pt continues to have dopplerable pulses bilaterally. Dr Erlinda Hong wrapping RLE wound with ACE bandage. Wound Vac therapy resumed at 63mm hg, per Dr Erlinda Hong.

## 2014-02-25 NOTE — Progress Notes (Signed)
MD called to bedside for concern for wound vac output. Compression wrap applied and suction intensity decreased. Patient had dopplerable DP and PT pulses.  Nonpalpable pulses at baseline. Will continue to monitor wound vac output.  Patient had large exposed bony surface from fracture that was very vascular. Dr. Marcelino Scot to evaluate patient later today for definitive fixation of orthopedic injury.  Azucena Cecil, MD [place] 7:06 AM
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
## 2014-02-25 NOTE — Progress Notes (Addendum)
2.7mg  (30ml) of Dilaudid PCA wasted with Bella Kennedy, RN  Trixie Rude RN

## 2014-02-25 NOTE — Progress Notes (Signed)
Physician notified: Rosendo Gros At: 1725  Regarding: Prepping pt for transfer to [facility]. Keep foley? Keep PCA? Keep IVF? Wound vac? Tele?  Awaiting return response.   Returned Response at: 1732  Order(s): Keep foley, IVF, wound vac and go on tele. DC PCA for transfer.

## 2014-02-25 NOTE — Progress Notes (Signed)
Spoke with Dr. Val Eagle over phone regarding transfer to [facility] for definitive management of patient's complex ortho injuries.  Dr. Driscilla Moats has agreed to accept patient to his service.  I have spoken to the trauma surgery service regarding transfer and they will initiate the transfer.  Appreciate trauma's help with all this.  Azucena Cecil, MD [place] 4:42 PM

## 2014-02-25 NOTE — Progress Notes (Signed)
Case discussed with Dr. Marcelino Scot.  He feels that the patient will ultimately require soft tissue coverage that no one in this community will be able to provide.  Recommend transfer to tertiary care center.  Appreciate trauma's assistance with this matter.  Azucena Cecil, MD [place] 1:28 PM

## 2014-02-25 NOTE — ED Notes (Signed)
Ortho Surgery at [facility].

## 2014-02-25 NOTE — Progress Notes (Addendum)
Physician notified: Xu  At: 0819  Regarding: unable to doppler pulse in R foot. More swelling per night nurse.  Awaiting return response.   Returned Response at: 0819  Order(s): Will be by.
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
## 2014-02-25 NOTE — Progress Notes (Signed)
Dr Erlinda Hong notified that pt is having excessive blood loss via wound vac. 350 cc frank blood in cannister at this time. Suction decreased to 23mm hg.

## 2014-02-25 NOTE — Progress Notes (Signed)
Physician notified: Erlinda Hong At: 1343  Regarding: Pt requesting to talk with you about possible amputation instead of more surgeries.  Awaiting return response.   Returned Response at: 1343  Will see patient after OR case.

## 2014-02-25 NOTE — Progress Notes (Signed)
Per Arlana Pouch, PA hold all orders until notified from Dr. Rosendo Gros with plan for patient.
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
## 2014-02-25 NOTE — Progress Notes (Signed)
Compression wrap removed and pulses returned.  Patient feels much better.

## 2014-02-25 NOTE — Anesthesia Preprocedure Evaluation (Signed)
Anesthesia Evaluation  Patient identified by MRN, date of birth, ID band Patient awake    Reviewed: Allergy & Precautions, H&P , NPO status , Patient's Chart, lab work & pertinent test results  Airway Mallampati: II  Neck ROM: full    Dental   Pulmonary          Cardiovascular hypertension,     Neuro/Psych    GI/Hepatic (+) Hepatitis -, C  Endo/Other    Renal/GU      Musculoskeletal   Abdominal   Peds  Hematology   Anesthesia Other Findings   Reproductive/Obstetrics                           Anesthesia Physical Anesthesia Plan  ASA: II  Anesthesia Plan: General   Post-op Pain Management:    Induction: Intravenous  Airway Management Planned: Oral ETT  Additional Equipment:   Intra-op Plan:   Post-operative Plan: Extubation in OR  Informed Consent: I have reviewed the patients History and Physical, chart, labs and discussed the procedure including the risks, benefits and alternatives for the proposed anesthesia with the patient or authorized representative who has indicated his/her understanding and acceptance.     Plan Discussed with: CRNA, Anesthesiologist and Surgeon  Anesthesia Plan Comments:         Anesthesia Quick Evaluation

## 2014-02-25 NOTE — Progress Notes (Signed)
Pt arrived to room via bed from PACU. Wound Vac cannister completely full of 500 cc frank bloody drainage. Cannister changed. Pedal pulses dopplered bilaterally. Pt alert, awake and oriented. Pt sinus tach. HR 118.
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
## 2014-02-25 NOTE — Anesthesia Procedure Notes (Signed)
Procedure Name: Intubation Date/Time: 02/25/2014 2:14 AM Performed by: Jacquiline Doe A Pre-anesthesia Checklist: Patient identified, Patient being monitored, Emergency Drugs available, Timeout performed and Suction available Patient Re-evaluated:Patient Re-evaluated prior to inductionOxygen Delivery Method: Circle system utilized Preoxygenation: Pre-oxygenation with 100% oxygen Intubation Type: IV induction, Rapid sequence and Cricoid Pressure applied Laryngoscope Size: Mac and 4 Grade View: Grade II Tube type: Oral Tube size: 7.5 mm Number of attempts: 1 Airway Equipment and Method: Stylet Placement Confirmation: ETT inserted through vocal cords under direct vision,  breath sounds checked- equal and bilateral and positive ETCO2 Secured at: 23 cm Tube secured with: Tape Dental Injury: Teeth and Oropharynx as per pre-operative assessment

## 2014-02-25 NOTE — Progress Notes (Addendum)
1840: [facility] called with transfer request.  1844: Report called to RN at [facility]. External fixators with wound vac in place to RLE.VSS. Pt to go to room R522. Pt to transfer with foley, with wound vac, IVF, on tele. Accepting physician: Dr. Corena Herter.   Radiology called to print disc of scans to RLE. Awaiting disc. Radiology sent reports through [facility].   All paperwork printed and sent with pt via [facility].

## 2014-02-26 NOTE — Anesthesia Postprocedure Evaluation (Signed)
Anesthesia Post Note  Patient: Joshua Burton  Procedure(s) Performed: Procedure(s) (LRB): EXTERNAL FIXATION LEG (Right)  Anesthesia type: General  Patient location: PACU  Post pain: Pain level controlled and Adequate analgesia  Post assessment: Post-op Vital signs reviewed, Patient's Cardiovascular Status Stable, Respiratory Function Stable, Patent Airway and Pain level controlled  Last Vitals:  Filed Vitals:   02/25/14 1952  BP: 135/67  Pulse: 46  Temp:   Resp: 17    Post vital signs: Reviewed and stable  Level of consciousness: awake, alert  and oriented  Complications: No apparent anesthesia complications

## 2014-02-26 NOTE — Progress Notes (Signed)
Portable Equipment notified this am that, according to the notes, pt transferred WITH the wound vac device in place.

## 2014-02-26 NOTE — Psychosocial Assessment (Signed)
I have read and reviewed SW Intern's Psychosocial Assessment and agree to it's content. Patient was transferred to [facility] yesterday evening.  CSW services signing off.  Lorie Phenix. [place], [place]

## 2014-02-27 LAB — TYPE AND SCREEN
ABO/RH(D): B POS
ANTIBODY SCREEN: POSITIVE
DAT, IgG: NEGATIVE
DONOR AG TYPE: NEGATIVE
Donor AG Type: NEGATIVE
PT AG Type: NEGATIVE
Unit division: 0
Unit division: 0

## 2014-03-04 ENCOUNTER — Encounter (HOSPITAL_COMMUNITY): Payer: Self-pay | Admitting: Orthopaedic Surgery

## 2014-03-17 ENCOUNTER — Encounter: Payer: Self-pay | Admitting: Vascular Surgery

## 2014-03-18 ENCOUNTER — Encounter: Payer: Medicare PPO | Admitting: Vascular Surgery

## 2014-04-15 ENCOUNTER — Encounter: Payer: Commercial Managed Care - HMO | Admitting: Vascular Surgery

## 2014-04-21 ENCOUNTER — Encounter: Payer: Self-pay | Admitting: Vascular Surgery

## 2014-04-22 ENCOUNTER — Encounter: Payer: Commercial Managed Care - HMO | Admitting: Vascular Surgery

## 2014-05-08 ENCOUNTER — Inpatient Hospital Stay
Admission: AD | Admit: 2014-05-08 | Discharge: 2014-06-06 | Disposition: A | Discharge status: Home Health | Payer: Self-pay | Source: Ambulatory Visit | Attending: Internal Medicine | Admitting: Internal Medicine

## 2014-05-08 DIAGNOSIS — D696 Thrombocytopenia, unspecified: Secondary | ICD-10-CM

## 2014-05-09 LAB — CBC WITH DIFFERENTIAL/PLATELET
Basophils Absolute: 0.1 K/uL (ref 0.0–0.1)
Basophils Relative: 1 % (ref 0–1)
Eosinophils Absolute: 0.5 K/uL (ref 0.0–0.7)
Eosinophils Relative: 8 % — ABNORMAL HIGH (ref 0–5)
HCT: 22.3 % — ABNORMAL LOW (ref 39.0–52.0)
Hemoglobin: 7.5 g/dL — ABNORMAL LOW (ref 13.0–17.0)
Lymphocytes Relative: 27 % (ref 12–46)
Lymphs Abs: 1.8 K/uL (ref 0.7–4.0)
MCH: 28.6 pg (ref 26.0–34.0)
MCHC: 33.6 g/dL (ref 30.0–36.0)
MCV: 85.1 fL (ref 78.0–100.0)
Monocytes Absolute: 1 K/uL (ref 0.1–1.0)
Monocytes Relative: 16 % — ABNORMAL HIGH (ref 3–12)
Neutro Abs: 3.1 K/uL (ref 1.7–7.7)
Neutrophils Relative %: 48 % (ref 43–77)
Platelets: 181 K/uL (ref 150–400)
RBC: 2.62 MIL/uL — ABNORMAL LOW (ref 4.22–5.81)
RDW: 16.2 % — ABNORMAL HIGH (ref 11.5–15.5)
WBC: 6.5 K/uL (ref 4.0–10.5)

## 2014-05-09 LAB — COMPREHENSIVE METABOLIC PANEL
ALK PHOS: 84 U/L (ref 39–117)
ALT: 18 U/L (ref 0–53)
AST: 53 U/L — AB (ref 0–37)
Albumin: 2.2 g/dL — ABNORMAL LOW (ref 3.5–5.2)
Anion gap: 14 (ref 5–15)
BUN: 26 mg/dL — ABNORMAL HIGH (ref 6–23)
CALCIUM: 8 mg/dL — AB (ref 8.4–10.5)
CO2: 19 mEq/L (ref 19–32)
Chloride: 100 mEq/L (ref 96–112)
Creatinine, Ser: 2.23 mg/dL — ABNORMAL HIGH (ref 0.50–1.35)
GFR calc non Af Amer: 30 mL/min — ABNORMAL LOW (ref >90)
GFR, EST AFRICAN AMERICAN: 35 mL/min — AB (ref >90)
GLUCOSE: 99 mg/dL (ref 70–99)
POTASSIUM: 3.7 meq/L (ref 3.7–5.3)
SODIUM: 133 meq/L — AB (ref 137–147)
Total Bilirubin: 0.4 mg/dL (ref 0.3–1.2)
Total Protein: 5.5 g/dL — ABNORMAL LOW (ref 6.0–8.3)

## 2014-05-09 LAB — MAGNESIUM: Magnesium: 2 mg/dL (ref 1.5–2.5)

## 2014-05-09 LAB — PHOSPHORUS: PHOSPHORUS: 3.8 mg/dL (ref 2.3–4.6)

## 2014-05-09 LAB — T4, FREE: FREE T4: 0.98 ng/dL (ref 0.80–1.80)

## 2014-05-09 LAB — VITAMIN B12: VITAMIN B 12: 945 pg/mL — AB (ref 211–911)

## 2014-05-09 LAB — TSH: TSH: 2.91 u[IU]/mL (ref 0.350–4.500)

## 2014-05-09 LAB — PREALBUMIN: PREALBUMIN: 4 mg/dL — AB (ref 17.0–34.0)

## 2014-05-10 LAB — BASIC METABOLIC PANEL
Anion gap: 13 (ref 5–15)
BUN: 26 mg/dL — ABNORMAL HIGH (ref 6–23)
CALCIUM: 7.9 mg/dL — AB (ref 8.4–10.5)
CO2: 20 mEq/L (ref 19–32)
CREATININE: 2.12 mg/dL — AB (ref 0.50–1.35)
Chloride: 103 mEq/L (ref 96–112)
GFR calc Af Amer: 37 mL/min — ABNORMAL LOW (ref >90)
GFR calc non Af Amer: 32 mL/min — ABNORMAL LOW (ref >90)
GLUCOSE: 95 mg/dL (ref 70–99)
Potassium: 4.2 mEq/L (ref 3.7–5.3)
Sodium: 136 mEq/L — ABNORMAL LOW (ref 137–147)

## 2014-05-10 LAB — HEMOGLOBIN AND HEMATOCRIT, BLOOD
HCT: 21.8 % — ABNORMAL LOW (ref 39.0–52.0)
Hemoglobin: 7.4 g/dL — ABNORMAL LOW (ref 13.0–17.0)

## 2014-05-10 LAB — FOLATE RBC: RBC Folate: 1540 ng/mL — ABNORMAL HIGH (ref >280)

## 2014-05-11 ENCOUNTER — Other Ambulatory Visit (HOSPITAL_COMMUNITY): Payer: Self-pay

## 2014-05-11 LAB — CBC WITH DIFFERENTIAL/PLATELET
Basophils Absolute: 0.1 10*3/uL (ref 0.0–0.1)
Basophils Relative: 1 % (ref 0–1)
Eosinophils Absolute: 0.6 10*3/uL (ref 0.0–0.7)
Eosinophils Relative: 9 % — ABNORMAL HIGH (ref 0–5)
HEMATOCRIT: 20.9 % — AB (ref 39.0–52.0)
Hemoglobin: 7.1 g/dL — ABNORMAL LOW (ref 13.0–17.0)
LYMPHS PCT: 38 % (ref 12–46)
Lymphs Abs: 2.5 10*3/uL (ref 0.7–4.0)
MCH: 28.1 pg (ref 26.0–34.0)
MCHC: 34 g/dL (ref 30.0–36.0)
MCV: 82.6 fL (ref 78.0–100.0)
MONO ABS: 1.1 10*3/uL — AB (ref 0.1–1.0)
Monocytes Relative: 16 % — ABNORMAL HIGH (ref 3–12)
Neutro Abs: 2.4 10*3/uL (ref 1.7–7.7)
Neutrophils Relative %: 36 % — ABNORMAL LOW (ref 43–77)
Platelets: 187 10*3/uL (ref 150–400)
RBC: 2.53 MIL/uL — ABNORMAL LOW (ref 4.22–5.81)
RDW: 16.3 % — AB (ref 11.5–15.5)
WBC: 6.6 10*3/uL (ref 4.0–10.5)

## 2014-05-11 LAB — COMPREHENSIVE METABOLIC PANEL
ALT: 18 U/L (ref 0–53)
AST: 58 U/L — AB (ref 0–37)
Albumin: 2.2 g/dL — ABNORMAL LOW (ref 3.5–5.2)
Alkaline Phosphatase: 83 U/L (ref 39–117)
Anion gap: 14 (ref 5–15)
BUN: 25 mg/dL — ABNORMAL HIGH (ref 6–23)
CALCIUM: 8.1 mg/dL — AB (ref 8.4–10.5)
CO2: 20 meq/L (ref 19–32)
CREATININE: 2.14 mg/dL — AB (ref 0.50–1.35)
Chloride: 99 mEq/L (ref 96–112)
GFR calc Af Amer: 37 mL/min — ABNORMAL LOW (ref >90)
GFR, EST NON AFRICAN AMERICAN: 32 mL/min — AB (ref >90)
Glucose, Bld: 97 mg/dL (ref 70–99)
Potassium: 3.8 mEq/L (ref 3.7–5.3)
Sodium: 133 mEq/L — ABNORMAL LOW (ref 137–147)
Total Bilirubin: 0.4 mg/dL (ref 0.3–1.2)
Total Protein: 5.6 g/dL — ABNORMAL LOW (ref 6.0–8.3)

## 2014-05-11 LAB — PHOSPHORUS: PHOSPHORUS: 3.3 mg/dL (ref 2.3–4.6)

## 2014-05-11 LAB — MAGNESIUM: MAGNESIUM: 1.8 mg/dL (ref 1.5–2.5)

## 2014-05-12 LAB — BASIC METABOLIC PANEL
ANION GAP: 12 (ref 5–15)
BUN: 23 mg/dL (ref 6–23)
CHLORIDE: 102 meq/L (ref 96–112)
CO2: 20 mEq/L (ref 19–32)
Calcium: 8.1 mg/dL — ABNORMAL LOW (ref 8.4–10.5)
Creatinine, Ser: 2.17 mg/dL — ABNORMAL HIGH (ref 0.50–1.35)
GFR calc Af Amer: 36 mL/min — ABNORMAL LOW (ref >90)
GFR calc non Af Amer: 31 mL/min — ABNORMAL LOW (ref >90)
Glucose, Bld: 100 mg/dL — ABNORMAL HIGH (ref 70–99)
POTASSIUM: 4.3 meq/L (ref 3.7–5.3)
Sodium: 134 mEq/L — ABNORMAL LOW (ref 137–147)

## 2014-05-12 LAB — CBC WITH DIFFERENTIAL/PLATELET
BASOS ABS: 0.1 10*3/uL (ref 0.0–0.1)
Basophils Relative: 1 % (ref 0–1)
Eosinophils Absolute: 0.7 10*3/uL (ref 0.0–0.7)
Eosinophils Relative: 12 % — ABNORMAL HIGH (ref 0–5)
HCT: 21.2 % — ABNORMAL LOW (ref 39.0–52.0)
HEMOGLOBIN: 7.2 g/dL — AB (ref 13.0–17.0)
LYMPHS PCT: 41 % (ref 12–46)
Lymphs Abs: 2.4 10*3/uL (ref 0.7–4.0)
MCH: 28.3 pg (ref 26.0–34.0)
MCHC: 34 g/dL (ref 30.0–36.0)
MCV: 83.5 fL (ref 78.0–100.0)
Monocytes Absolute: 0.9 10*3/uL (ref 0.1–1.0)
Monocytes Relative: 15 % — ABNORMAL HIGH (ref 3–12)
NEUTROS ABS: 1.9 10*3/uL (ref 1.7–7.7)
NEUTROS PCT: 31 % — AB (ref 43–77)
Platelets: 193 10*3/uL (ref 150–400)
RBC: 2.54 MIL/uL — ABNORMAL LOW (ref 4.22–5.81)
RDW: 16.6 % — AB (ref 11.5–15.5)
WBC: 6 10*3/uL (ref 4.0–10.5)

## 2014-05-12 LAB — PREPARE RBC (CROSSMATCH)

## 2014-05-12 LAB — PHOSPHORUS: Phosphorus: 3.5 mg/dL (ref 2.3–4.6)

## 2014-05-12 LAB — MAGNESIUM: Magnesium: 1.9 mg/dL (ref 1.5–2.5)

## 2014-05-13 LAB — CBC
HEMATOCRIT: 26.8 % — AB (ref 39.0–52.0)
Hemoglobin: 8.9 g/dL — ABNORMAL LOW (ref 13.0–17.0)
MCH: 28 pg (ref 26.0–34.0)
MCHC: 33.2 g/dL (ref 30.0–36.0)
MCV: 84.3 fL (ref 78.0–100.0)
PLATELETS: 176 10*3/uL (ref 150–400)
RBC: 3.18 MIL/uL — AB (ref 4.22–5.81)
RDW: 16 % — AB (ref 11.5–15.5)
WBC: 5.2 10*3/uL (ref 4.0–10.5)

## 2014-05-13 LAB — TYPE AND SCREEN
ABO/RH(D): B POS
ANTIBODY SCREEN: POSITIVE
DAT, IgG: NEGATIVE
DONOR AG TYPE: NEGATIVE
Donor AG Type: NEGATIVE
UNIT DIVISION: 0
Unit division: 0

## 2014-05-13 LAB — CBC WITH DIFFERENTIAL/PLATELET
BASOS ABS: 0.1 10*3/uL (ref 0.0–0.1)
Basophils Relative: 1 % (ref 0–1)
EOS PCT: 10 % — AB (ref 0–5)
Eosinophils Absolute: 0.6 10*3/uL (ref 0.0–0.7)
HEMATOCRIT: 26.9 % — AB (ref 39.0–52.0)
Hemoglobin: 9 g/dL — ABNORMAL LOW (ref 13.0–17.0)
Lymphocytes Relative: 36 % (ref 12–46)
Lymphs Abs: 2 10*3/uL (ref 0.7–4.0)
MCH: 28.5 pg (ref 26.0–34.0)
MCHC: 33.5 g/dL (ref 30.0–36.0)
MCV: 85.1 fL (ref 78.0–100.0)
Monocytes Absolute: 0.9 10*3/uL (ref 0.1–1.0)
Monocytes Relative: 17 % — ABNORMAL HIGH (ref 3–12)
NEUTROS ABS: 1.9 10*3/uL (ref 1.7–7.7)
Neutrophils Relative %: 35 % — ABNORMAL LOW (ref 43–77)
Platelets: 146 10*3/uL — ABNORMAL LOW (ref 150–400)
RBC: 3.16 MIL/uL — ABNORMAL LOW (ref 4.22–5.81)
RDW: 15.6 % — ABNORMAL HIGH (ref 11.5–15.5)
WBC: 5.4 10*3/uL (ref 4.0–10.5)

## 2014-05-13 LAB — BASIC METABOLIC PANEL
Anion gap: 15 (ref 5–15)
BUN: 23 mg/dL (ref 6–23)
CALCIUM: 8.1 mg/dL — AB (ref 8.4–10.5)
CO2: 19 meq/L (ref 19–32)
CREATININE: 2.09 mg/dL — AB (ref 0.50–1.35)
Chloride: 100 mEq/L (ref 96–112)
GFR calc Af Amer: 38 mL/min — ABNORMAL LOW (ref >90)
GFR calc non Af Amer: 33 mL/min — ABNORMAL LOW (ref >90)
Glucose, Bld: 90 mg/dL (ref 70–99)
Potassium: 3.8 mEq/L (ref 3.7–5.3)
Sodium: 134 mEq/L — ABNORMAL LOW (ref 137–147)

## 2014-05-13 LAB — CK: Total CK: 63 U/L (ref 7–232)

## 2014-05-14 LAB — C-REACTIVE PROTEIN: CRP: <0.5 mg/dL — ABNORMAL LOW (ref <0.60)

## 2014-05-14 LAB — SEDIMENTATION RATE: SED RATE: 14 mm/h (ref 0–16)

## 2014-05-15 LAB — MAGNESIUM: MAGNESIUM: 1.7 mg/dL (ref 1.5–2.5)

## 2014-05-15 LAB — BASIC METABOLIC PANEL
ANION GAP: 15 (ref 5–15)
BUN: 23 mg/dL (ref 6–23)
CHLORIDE: 98 meq/L (ref 96–112)
CO2: 19 meq/L (ref 19–32)
Calcium: 8.2 mg/dL — ABNORMAL LOW (ref 8.4–10.5)
Creatinine, Ser: 2.12 mg/dL — ABNORMAL HIGH (ref 0.50–1.35)
GFR calc Af Amer: 37 mL/min — ABNORMAL LOW (ref >90)
GFR calc non Af Amer: 32 mL/min — ABNORMAL LOW (ref >90)
Glucose, Bld: 104 mg/dL — ABNORMAL HIGH (ref 70–99)
Potassium: 3.6 mEq/L — ABNORMAL LOW (ref 3.7–5.3)
SODIUM: 132 meq/L — AB (ref 137–147)

## 2014-05-15 LAB — CBC WITH DIFFERENTIAL/PLATELET
Basophils Absolute: 0.1 10*3/uL (ref 0.0–0.1)
Basophils Relative: 1 % (ref 0–1)
Eosinophils Absolute: 0.7 10*3/uL (ref 0.0–0.7)
Eosinophils Relative: 12 % — ABNORMAL HIGH (ref 0–5)
HEMATOCRIT: 27.2 % — AB (ref 39.0–52.0)
Hemoglobin: 9.2 g/dL — ABNORMAL LOW (ref 13.0–17.0)
LYMPHS PCT: 37 % (ref 12–46)
Lymphs Abs: 2.1 10*3/uL (ref 0.7–4.0)
MCH: 28.1 pg (ref 26.0–34.0)
MCHC: 33.8 g/dL (ref 30.0–36.0)
MCV: 83.2 fL (ref 78.0–100.0)
Monocytes Absolute: 0.8 10*3/uL (ref 0.1–1.0)
Monocytes Relative: 15 % — ABNORMAL HIGH (ref 3–12)
NEUTROS ABS: 1.9 10*3/uL (ref 1.7–7.7)
Neutrophils Relative %: 35 % — ABNORMAL LOW (ref 43–77)
PLATELETS: 191 10*3/uL (ref 150–400)
RBC: 3.27 MIL/uL — AB (ref 4.22–5.81)
RDW: 16.3 % — ABNORMAL HIGH (ref 11.5–15.5)
WBC: 5.6 10*3/uL (ref 4.0–10.5)

## 2014-05-15 LAB — PHOSPHORUS: PHOSPHORUS: 3.4 mg/dL (ref 2.3–4.6)

## 2014-05-16 LAB — BASIC METABOLIC PANEL
Anion gap: 16 — ABNORMAL HIGH (ref 5–15)
BUN: 23 mg/dL (ref 6–23)
CO2: 19 mEq/L (ref 19–32)
Calcium: 8.6 mg/dL (ref 8.4–10.5)
Chloride: 100 mEq/L (ref 96–112)
Creatinine, Ser: 2.18 mg/dL — ABNORMAL HIGH (ref 0.50–1.35)
GFR, EST AFRICAN AMERICAN: 36 mL/min — AB (ref >90)
GFR, EST NON AFRICAN AMERICAN: 31 mL/min — AB (ref >90)
Glucose, Bld: 109 mg/dL — ABNORMAL HIGH (ref 70–99)
POTASSIUM: 4.8 meq/L (ref 3.7–5.3)
SODIUM: 135 meq/L — AB (ref 137–147)

## 2014-05-16 LAB — MAGNESIUM: MAGNESIUM: 1.7 mg/dL (ref 1.5–2.5)

## 2014-05-16 LAB — PHOSPHORUS: Phosphorus: 3.6 mg/dL (ref 2.3–4.6)

## 2014-05-18 LAB — BASIC METABOLIC PANEL
ANION GAP: 18 — AB (ref 5–15)
BUN: 19 mg/dL (ref 6–23)
CALCIUM: 6.7 mg/dL — AB (ref 8.4–10.5)
CO2: 19 meq/L (ref 19–32)
Chloride: 106 mEq/L (ref 96–112)
Creatinine, Ser: 1.59 mg/dL — ABNORMAL HIGH (ref 0.50–1.35)
GFR calc Af Amer: 52 mL/min — ABNORMAL LOW (ref >90)
GFR calc non Af Amer: 45 mL/min — ABNORMAL LOW (ref >90)
Glucose, Bld: 86 mg/dL (ref 70–99)
Potassium: 3.1 mEq/L — ABNORMAL LOW (ref 3.7–5.3)
Sodium: 143 mEq/L (ref 137–147)

## 2014-05-19 LAB — CBC WITH DIFFERENTIAL/PLATELET
BASOS ABS: 0.1 10*3/uL (ref 0.0–0.1)
BASOS PCT: 2 % — AB (ref 0–1)
EOS PCT: 10 % — AB (ref 0–5)
Eosinophils Absolute: 0.4 10*3/uL (ref 0.0–0.7)
HEMATOCRIT: 26.6 % — AB (ref 39.0–52.0)
Hemoglobin: 8.9 g/dL — ABNORMAL LOW (ref 13.0–17.0)
Lymphocytes Relative: 31 % (ref 12–46)
Lymphs Abs: 1.4 10*3/uL (ref 0.7–4.0)
MCH: 28.4 pg (ref 26.0–34.0)
MCHC: 33.5 g/dL (ref 30.0–36.0)
MCV: 85 fL (ref 78.0–100.0)
Monocytes Absolute: 0.7 10*3/uL (ref 0.1–1.0)
Monocytes Relative: 15 % — ABNORMAL HIGH (ref 3–12)
NEUTROS ABS: 2 10*3/uL (ref 1.7–7.7)
Neutrophils Relative %: 42 % — ABNORMAL LOW (ref 43–77)
Platelets: 167 10*3/uL (ref 150–400)
RBC: 3.13 MIL/uL — ABNORMAL LOW (ref 4.22–5.81)
RDW: 17.6 % — ABNORMAL HIGH (ref 11.5–15.5)
WBC: 4.7 10*3/uL (ref 4.0–10.5)

## 2014-05-19 LAB — COMPREHENSIVE METABOLIC PANEL
ALT: 20 U/L (ref 0–53)
AST: 69 U/L — AB (ref 0–37)
Albumin: 2.4 g/dL — ABNORMAL LOW (ref 3.5–5.2)
Alkaline Phosphatase: 89 U/L (ref 39–117)
Anion gap: 17 — ABNORMAL HIGH (ref 5–15)
BUN: 21 mg/dL (ref 6–23)
CALCIUM: 8.2 mg/dL — AB (ref 8.4–10.5)
CHLORIDE: 100 meq/L (ref 96–112)
CO2: 20 mEq/L (ref 19–32)
CREATININE: 1.93 mg/dL — AB (ref 0.50–1.35)
GFR calc Af Amer: 41 mL/min — ABNORMAL LOW (ref >90)
GFR calc non Af Amer: 36 mL/min — ABNORMAL LOW (ref >90)
Glucose, Bld: 93 mg/dL (ref 70–99)
Potassium: 3.2 mEq/L — ABNORMAL LOW (ref 3.7–5.3)
Sodium: 137 mEq/L (ref 137–147)
TOTAL PROTEIN: 5.8 g/dL — AB (ref 6.0–8.3)
Total Bilirubin: 0.6 mg/dL (ref 0.3–1.2)

## 2014-05-19 LAB — C-REACTIVE PROTEIN: CRP: <0.5 mg/dL — ABNORMAL LOW (ref <0.60)

## 2014-05-19 LAB — PREALBUMIN: PREALBUMIN: 5.1 mg/dL — AB (ref 17.0–34.0)

## 2014-05-19 LAB — SEDIMENTATION RATE: SED RATE: 15 mm/h (ref 0–16)

## 2014-05-20 LAB — CK: Total CK: 54 U/L (ref 7–232)

## 2014-05-20 LAB — BASIC METABOLIC PANEL
Anion gap: 14 (ref 5–15)
BUN: 20 mg/dL (ref 6–23)
CO2: 22 meq/L (ref 19–32)
Calcium: 8.2 mg/dL — ABNORMAL LOW (ref 8.4–10.5)
Chloride: 102 mEq/L (ref 96–112)
Creatinine, Ser: 1.81 mg/dL — ABNORMAL HIGH (ref 0.50–1.35)
GFR calc Af Amer: 45 mL/min — ABNORMAL LOW (ref >90)
GFR, EST NON AFRICAN AMERICAN: 39 mL/min — AB (ref >90)
Glucose, Bld: 101 mg/dL — ABNORMAL HIGH (ref 70–99)
POTASSIUM: 3.7 meq/L (ref 3.7–5.3)
SODIUM: 138 meq/L (ref 137–147)

## 2014-05-21 ENCOUNTER — Other Ambulatory Visit (HOSPITAL_COMMUNITY): Payer: Self-pay

## 2014-05-21 LAB — BODY FLUID CELL COUNT WITH DIFFERENTIAL
Eos, Fluid: 0 %
Lymphs, Fluid: 66 %
Monocyte-Macrophage-Serous Fluid: 29 % — ABNORMAL LOW (ref 50–90)
NEUTROPHIL FLUID: 5 % (ref 0–25)
Total Nucleated Cell Count, Fluid: 294 cu mm (ref 0–1000)

## 2014-05-21 LAB — LACTATE DEHYDROGENASE, PLEURAL OR PERITONEAL FLUID: LD FL: 61 U/L — AB (ref 3–23)

## 2014-05-21 NOTE — Procedures (Signed)
Successful US guided paracentesis from RLQ.  Yielded 5.7 liters of clear yellow fluid.  No immediate complications.  Pt tolerated well.   Specimen was sent for labs.  Tsosie Billing D PA-C 05/21/2014 4:29 PM

## 2014-05-22 DIAGNOSIS — M79609 Pain in unspecified limb: Secondary | ICD-10-CM

## 2014-05-22 DIAGNOSIS — R609 Edema, unspecified: Secondary | ICD-10-CM

## 2014-05-22 LAB — BASIC METABOLIC PANEL
ANION GAP: 14 (ref 5–15)
BUN: 21 mg/dL (ref 6–23)
CALCIUM: 7.9 mg/dL — AB (ref 8.4–10.5)
CO2: 24 mEq/L (ref 19–32)
CREATININE: 1.74 mg/dL — AB (ref 0.50–1.35)
Chloride: 101 mEq/L (ref 96–112)
GFR, EST AFRICAN AMERICAN: 47 mL/min — AB (ref >90)
GFR, EST NON AFRICAN AMERICAN: 41 mL/min — AB (ref >90)
Glucose, Bld: 96 mg/dL (ref 70–99)
Potassium: 3.4 mEq/L — ABNORMAL LOW (ref 3.7–5.3)
Sodium: 139 mEq/L (ref 137–147)

## 2014-05-22 LAB — CBC
HCT: 24.9 % — ABNORMAL LOW (ref 39.0–52.0)
Hemoglobin: 8.4 g/dL — ABNORMAL LOW (ref 13.0–17.0)
MCH: 28.1 pg (ref 26.0–34.0)
MCHC: 33.7 g/dL (ref 30.0–36.0)
MCV: 83.3 fL (ref 78.0–100.0)
PLATELETS: 128 10*3/uL — AB (ref 150–400)
RBC: 2.99 MIL/uL — ABNORMAL LOW (ref 4.22–5.81)
RDW: 18.5 % — AB (ref 11.5–15.5)
WBC: 5 10*3/uL (ref 4.0–10.5)

## 2014-05-22 NOTE — Progress Notes (Signed)
Left lower extremity venous duplex completed.  Left:  No evidence of DVT, superficial thrombosis, or Baker's cyst.  Right:  Negative for DVT in the common femoral vein.  

## 2014-05-23 LAB — POTASSIUM: Potassium: 3.7 mEq/L (ref 3.7–5.3)

## 2014-05-23 LAB — MAGNESIUM: Magnesium: 1.4 mg/dL — ABNORMAL LOW (ref 1.5–2.5)

## 2014-05-24 LAB — BODY FLUID CULTURE
CULTURE: NO GROWTH
Gram Stain: NONE SEEN

## 2014-05-25 LAB — BASIC METABOLIC PANEL
Anion gap: 13 (ref 5–15)
BUN: 36 mg/dL — ABNORMAL HIGH (ref 6–23)
CALCIUM: 7.9 mg/dL — AB (ref 8.4–10.5)
CO2: 25 mEq/L (ref 19–32)
CREATININE: 1.59 mg/dL — AB (ref 0.50–1.35)
Chloride: 99 mEq/L (ref 96–112)
GFR calc Af Amer: 52 mL/min — ABNORMAL LOW (ref >90)
GFR, EST NON AFRICAN AMERICAN: 45 mL/min — AB (ref >90)
GLUCOSE: 97 mg/dL (ref 70–99)
Potassium: 3.5 mEq/L — ABNORMAL LOW (ref 3.7–5.3)
Sodium: 137 mEq/L (ref 137–147)

## 2014-05-25 LAB — CBC WITH DIFFERENTIAL/PLATELET
Basophils Absolute: 0.1 10*3/uL (ref 0.0–0.1)
Basophils Relative: 1 % (ref 0–1)
EOS ABS: 0.5 10*3/uL (ref 0.0–0.7)
Eosinophils Relative: 7 % — ABNORMAL HIGH (ref 0–5)
HEMATOCRIT: 22.9 % — AB (ref 39.0–52.0)
HEMOGLOBIN: 7.6 g/dL — AB (ref 13.0–17.0)
LYMPHS ABS: 3.4 10*3/uL (ref 0.7–4.0)
Lymphocytes Relative: 43 % (ref 12–46)
MCH: 27.7 pg (ref 26.0–34.0)
MCHC: 33.2 g/dL (ref 30.0–36.0)
MCV: 83.6 fL (ref 78.0–100.0)
MONOS PCT: 14 % — AB (ref 3–12)
Monocytes Absolute: 1.1 10*3/uL — ABNORMAL HIGH (ref 0.1–1.0)
Neutro Abs: 2.7 10*3/uL (ref 1.7–7.7)
Neutrophils Relative %: 35 % — ABNORMAL LOW (ref 43–77)
Platelets: 140 10*3/uL — ABNORMAL LOW (ref 150–400)
RBC: 2.74 MIL/uL — ABNORMAL LOW (ref 4.22–5.81)
RDW: 20.5 % — ABNORMAL HIGH (ref 11.5–15.5)
WBC: 7.8 10*3/uL (ref 4.0–10.5)

## 2014-05-25 LAB — C-REACTIVE PROTEIN: CRP: <0.5 mg/dL — ABNORMAL LOW (ref <0.60)

## 2014-05-25 LAB — MAGNESIUM: Magnesium: 1.4 mg/dL — ABNORMAL LOW (ref 1.5–2.5)

## 2014-05-25 LAB — PHOSPHORUS: Phosphorus: 3.6 mg/dL (ref 2.3–4.6)

## 2014-05-25 LAB — PREALBUMIN: PREALBUMIN: 6.7 mg/dL — AB (ref 17.0–34.0)

## 2014-05-25 LAB — SEDIMENTATION RATE: Sed Rate: 9 mm/hr (ref 0–16)

## 2014-05-26 LAB — CBC WITH DIFFERENTIAL/PLATELET
BASOS PCT: 1 % (ref 0–1)
Basophils Absolute: 0.1 10*3/uL (ref 0.0–0.1)
Eosinophils Absolute: 0.6 10*3/uL (ref 0.0–0.7)
Eosinophils Relative: 9 % — ABNORMAL HIGH (ref 0–5)
HCT: 24 % — ABNORMAL LOW (ref 39.0–52.0)
HEMOGLOBIN: 8 g/dL — AB (ref 13.0–17.0)
LYMPHS ABS: 2.6 10*3/uL (ref 0.7–4.0)
Lymphocytes Relative: 36 % (ref 12–46)
MCH: 27.8 pg (ref 26.0–34.0)
MCHC: 33.3 g/dL (ref 30.0–36.0)
MCV: 83.3 fL (ref 78.0–100.0)
MONO ABS: 1.1 10*3/uL — AB (ref 0.1–1.0)
MONOS PCT: 14 % — AB (ref 3–12)
NEUTROS ABS: 3 10*3/uL (ref 1.7–7.7)
NEUTROS PCT: 40 % — AB (ref 43–77)
Platelets: 140 10*3/uL — ABNORMAL LOW (ref 150–400)
RBC: 2.88 MIL/uL — AB (ref 4.22–5.81)
RDW: 20.7 % — ABNORMAL HIGH (ref 11.5–15.5)
WBC: 7.3 10*3/uL (ref 4.0–10.5)

## 2014-05-26 LAB — BASIC METABOLIC PANEL
Anion gap: 13 (ref 5–15)
BUN: 36 mg/dL — ABNORMAL HIGH (ref 6–23)
CHLORIDE: 100 meq/L (ref 96–112)
CO2: 24 meq/L (ref 19–32)
Calcium: 7.9 mg/dL — ABNORMAL LOW (ref 8.4–10.5)
Creatinine, Ser: 1.43 mg/dL — ABNORMAL HIGH (ref 0.50–1.35)
GFR calc non Af Amer: 51 mL/min — ABNORMAL LOW (ref >90)
GFR, EST AFRICAN AMERICAN: 60 mL/min — AB (ref >90)
GLUCOSE: 105 mg/dL — AB (ref 70–99)
POTASSIUM: 3.8 meq/L (ref 3.7–5.3)
Sodium: 137 mEq/L (ref 137–147)

## 2014-05-26 LAB — PHOSPHORUS: PHOSPHORUS: 3.9 mg/dL (ref 2.3–4.6)

## 2014-05-26 LAB — MAGNESIUM: Magnesium: 1.8 mg/dL (ref 1.5–2.5)

## 2014-05-27 LAB — CK: CK TOTAL: 43 U/L (ref 7–232)

## 2014-05-28 LAB — CBC WITH DIFFERENTIAL/PLATELET
Basophils Absolute: 0.1 K/uL (ref 0.0–0.1)
Basophils Relative: 2 % — ABNORMAL HIGH (ref 0–1)
Eosinophils Absolute: 0.6 K/uL (ref 0.0–0.7)
Eosinophils Relative: 11 % — ABNORMAL HIGH (ref 0–5)
HCT: 24.7 % — ABNORMAL LOW (ref 39.0–52.0)
Hemoglobin: 8.3 g/dL — ABNORMAL LOW (ref 13.0–17.0)
Lymphocytes Relative: 36 % (ref 12–46)
Lymphs Abs: 2.2 K/uL (ref 0.7–4.0)
MCH: 29 pg (ref 26.0–34.0)
MCHC: 33.6 g/dL (ref 30.0–36.0)
MCV: 86.4 fL (ref 78.0–100.0)
Monocytes Absolute: 0.9 K/uL (ref 0.1–1.0)
Monocytes Relative: 16 % — ABNORMAL HIGH (ref 3–12)
Neutro Abs: 2 K/uL (ref 1.7–7.7)
Neutrophils Relative %: 35 % — ABNORMAL LOW (ref 43–77)
Platelets: 138 K/uL — ABNORMAL LOW (ref 150–400)
RBC: 2.86 MIL/uL — ABNORMAL LOW (ref 4.22–5.81)
RDW: 21.3 % — ABNORMAL HIGH (ref 11.5–15.5)
WBC: 5.8 K/uL (ref 4.0–10.5)

## 2014-05-28 LAB — BASIC METABOLIC PANEL
ANION GAP: 11 (ref 5–15)
BUN: 33 mg/dL — AB (ref 6–23)
CHLORIDE: 101 meq/L (ref 96–112)
CO2: 25 meq/L (ref 19–32)
CREATININE: 1.36 mg/dL — AB (ref 0.50–1.35)
Calcium: 8 mg/dL — ABNORMAL LOW (ref 8.4–10.5)
GFR calc Af Amer: 63 mL/min — ABNORMAL LOW (ref >90)
GFR calc non Af Amer: 55 mL/min — ABNORMAL LOW (ref >90)
Glucose, Bld: 93 mg/dL (ref 70–99)
Potassium: 3.5 mEq/L — ABNORMAL LOW (ref 3.7–5.3)
Sodium: 137 mEq/L (ref 137–147)

## 2014-05-28 LAB — PHOSPHORUS: Phosphorus: 4 mg/dL (ref 2.3–4.6)

## 2014-05-28 LAB — MAGNESIUM: Magnesium: 1.8 mg/dL (ref 1.5–2.5)

## 2014-05-29 LAB — BASIC METABOLIC PANEL
Anion gap: 10 (ref 5–15)
BUN: 30 mg/dL — ABNORMAL HIGH (ref 6–23)
CO2: 25 mEq/L (ref 19–32)
Calcium: 8.1 mg/dL — ABNORMAL LOW (ref 8.4–10.5)
Chloride: 102 mEq/L (ref 96–112)
Creatinine, Ser: 1.38 mg/dL — ABNORMAL HIGH (ref 0.50–1.35)
GFR calc Af Amer: 62 mL/min — ABNORMAL LOW (ref >90)
GFR calc non Af Amer: 54 mL/min — ABNORMAL LOW (ref >90)
GLUCOSE: 91 mg/dL (ref 70–99)
POTASSIUM: 4.1 meq/L (ref 3.7–5.3)
SODIUM: 137 meq/L (ref 137–147)

## 2014-05-30 ENCOUNTER — Other Ambulatory Visit (HOSPITAL_COMMUNITY): Payer: Self-pay

## 2014-05-30 LAB — CBC WITH DIFFERENTIAL/PLATELET
Basophils Absolute: 0.1 10*3/uL (ref 0.0–0.1)
Basophils Relative: 1 % (ref 0–1)
EOS PCT: 8 % — AB (ref 0–5)
Eosinophils Absolute: 0.5 10*3/uL (ref 0.0–0.7)
HCT: 24.9 % — ABNORMAL LOW (ref 39.0–52.0)
Hemoglobin: 8.3 g/dL — ABNORMAL LOW (ref 13.0–17.0)
LYMPHS ABS: 1.9 10*3/uL (ref 0.7–4.0)
Lymphocytes Relative: 29 % (ref 12–46)
MCH: 28.6 pg (ref 26.0–34.0)
MCHC: 33.3 g/dL (ref 30.0–36.0)
MCV: 85.9 fL (ref 78.0–100.0)
Monocytes Absolute: 0.9 10*3/uL (ref 0.1–1.0)
Monocytes Relative: 13 % — ABNORMAL HIGH (ref 3–12)
NEUTROS PCT: 49 % (ref 43–77)
Neutro Abs: 3.3 10*3/uL (ref 1.7–7.7)
Platelets: 141 10*3/uL — ABNORMAL LOW (ref 150–400)
RBC: 2.9 MIL/uL — AB (ref 4.22–5.81)
RDW: 21.8 % — ABNORMAL HIGH (ref 11.5–15.5)
SMEAR REVIEW: DECREASED
WBC: 6.7 10*3/uL (ref 4.0–10.5)

## 2014-05-30 LAB — BASIC METABOLIC PANEL
ANION GAP: 12 (ref 5–15)
BUN: 29 mg/dL — ABNORMAL HIGH (ref 6–23)
CHLORIDE: 102 meq/L (ref 96–112)
CO2: 24 meq/L (ref 19–32)
Calcium: 8 mg/dL — ABNORMAL LOW (ref 8.4–10.5)
Creatinine, Ser: 1.37 mg/dL — ABNORMAL HIGH (ref 0.50–1.35)
GFR calc non Af Amer: 54 mL/min — ABNORMAL LOW (ref >90)
GFR, EST AFRICAN AMERICAN: 63 mL/min — AB (ref >90)
Glucose, Bld: 99 mg/dL (ref 70–99)
POTASSIUM: 4.2 meq/L (ref 3.7–5.3)
SODIUM: 138 meq/L (ref 137–147)

## 2014-05-30 LAB — PHOSPHORUS: Phosphorus: 3.7 mg/dL (ref 2.3–4.6)

## 2014-05-30 LAB — MAGNESIUM: Magnesium: 1.8 mg/dL (ref 1.5–2.5)

## 2014-05-30 NOTE — Procedures (Signed)
Successful US guided paracentesis from RLQ.  Yielded 8 liters of clear yellow fluid.  No immediate complications.  Pt tolerated well.   Specimen was not sent for labs.  Tsosie Billing D PA-C 05/30/2014 4:47 PM

## 2014-06-02 ENCOUNTER — Encounter: Payer: Self-pay | Admitting: Vascular Surgery

## 2014-06-02 LAB — CBC
HCT: 25.5 % — ABNORMAL LOW (ref 39.0–52.0)
Hemoglobin: 8.5 g/dL — ABNORMAL LOW (ref 13.0–17.0)
MCH: 28.8 pg (ref 26.0–34.0)
MCHC: 33.3 g/dL (ref 30.0–36.0)
MCV: 86.4 fL (ref 78.0–100.0)
PLATELETS: 140 10*3/uL — AB (ref 150–400)
RBC: 2.95 MIL/uL — ABNORMAL LOW (ref 4.22–5.81)
RDW: 21.3 % — AB (ref 11.5–15.5)
WBC: 5.8 10*3/uL (ref 4.0–10.5)

## 2014-06-02 LAB — COMPREHENSIVE METABOLIC PANEL
ALBUMIN: 2.2 g/dL — AB (ref 3.5–5.2)
ALT: 28 U/L (ref 0–53)
AST: 74 U/L — AB (ref 0–37)
Alkaline Phosphatase: 84 U/L (ref 39–117)
Anion gap: 11 (ref 5–15)
BILIRUBIN TOTAL: 0.6 mg/dL (ref 0.3–1.2)
BUN: 27 mg/dL — ABNORMAL HIGH (ref 6–23)
CHLORIDE: 101 meq/L (ref 96–112)
CO2: 26 mEq/L (ref 19–32)
CREATININE: 1.28 mg/dL (ref 0.50–1.35)
Calcium: 8.1 mg/dL — ABNORMAL LOW (ref 8.4–10.5)
GFR calc Af Amer: 68 mL/min — ABNORMAL LOW (ref >90)
GFR, EST NON AFRICAN AMERICAN: 59 mL/min — AB (ref >90)
Glucose, Bld: 100 mg/dL — ABNORMAL HIGH (ref 70–99)
Potassium: 4.1 mEq/L (ref 3.7–5.3)
Sodium: 138 mEq/L (ref 137–147)
Total Protein: 5.3 g/dL — ABNORMAL LOW (ref 6.0–8.3)

## 2014-06-02 LAB — C-REACTIVE PROTEIN: CRP: <0.5 mg/dL — ABNORMAL LOW (ref <0.60)

## 2014-06-02 LAB — PREALBUMIN: Prealbumin: 6.9 mg/dL — ABNORMAL LOW (ref 17.0–34.0)

## 2014-06-02 LAB — SEDIMENTATION RATE: SED RATE: 16 mm/h (ref 0–16)

## 2014-06-03 ENCOUNTER — Encounter: Payer: Commercial Managed Care - HMO | Admitting: Vascular Surgery

## 2014-06-03 LAB — CK: CK TOTAL: 41 U/L (ref 7–232)

## 2014-06-05 ENCOUNTER — Other Ambulatory Visit (HOSPITAL_COMMUNITY): Payer: Commercial Managed Care - HMO

## 2014-06-05 LAB — BASIC METABOLIC PANEL
Anion gap: 10 (ref 5–15)
BUN: 24 mg/dL — ABNORMAL HIGH (ref 6–23)
CO2: 25 mEq/L (ref 19–32)
Calcium: 7.9 mg/dL — ABNORMAL LOW (ref 8.4–10.5)
Chloride: 102 mEq/L (ref 96–112)
Creatinine, Ser: 1.11 mg/dL (ref 0.50–1.35)
GFR calc Af Amer: 81 mL/min — ABNORMAL LOW (ref >90)
GFR calc non Af Amer: 70 mL/min — ABNORMAL LOW (ref >90)
GLUCOSE: 97 mg/dL (ref 70–99)
POTASSIUM: 4.1 meq/L (ref 3.7–5.3)
SODIUM: 137 meq/L (ref 137–147)

## 2014-06-05 NOTE — Procedures (Signed)
Successful US guided paracentesis from RLQ.  Yielded 3.8 liters of yellow fluid.  No immediate complications.  Pt tolerated well.   Specimen was not sent for labs.  Tsosie Billing D PA-C 06/05/2014 4:08 PM

## 2014-09-13 ENCOUNTER — Inpatient Hospital Stay (HOSPITAL_COMMUNITY)
Admission: EM | Admit: 2014-09-13 | Discharge: 2014-09-16 | DRG: 432 | Disposition: A | Discharge status: Home / Self Care | Payer: Medicare HMO | Attending: Internal Medicine | Admitting: Internal Medicine

## 2014-09-13 ENCOUNTER — Encounter (HOSPITAL_COMMUNITY): Payer: Self-pay | Admitting: Emergency Medicine

## 2014-09-13 DIAGNOSIS — D62 Acute posthemorrhagic anemia: Secondary | ICD-10-CM | POA: Diagnosis present

## 2014-09-13 DIAGNOSIS — I10 Essential (primary) hypertension: Secondary | ICD-10-CM | POA: Diagnosis present

## 2014-09-13 DIAGNOSIS — I8511 Secondary esophageal varices with bleeding: Secondary | ICD-10-CM | POA: Diagnosis present

## 2014-09-13 DIAGNOSIS — Z79899 Other long term (current) drug therapy: Secondary | ICD-10-CM | POA: Diagnosis not present

## 2014-09-13 DIAGNOSIS — E871 Hypo-osmolality and hyponatremia: Secondary | ICD-10-CM | POA: Diagnosis present

## 2014-09-13 DIAGNOSIS — D684 Acquired coagulation factor deficiency: Secondary | ICD-10-CM | POA: Diagnosis present

## 2014-09-13 DIAGNOSIS — K7460 Unspecified cirrhosis of liver: Secondary | ICD-10-CM | POA: Diagnosis present

## 2014-09-13 DIAGNOSIS — R571 Hypovolemic shock: Secondary | ICD-10-CM | POA: Diagnosis present

## 2014-09-13 DIAGNOSIS — K922 Gastrointestinal hemorrhage, unspecified: Secondary | ICD-10-CM | POA: Diagnosis present

## 2014-09-13 DIAGNOSIS — B182 Chronic viral hepatitis C: Secondary | ICD-10-CM | POA: Diagnosis present

## 2014-09-13 DIAGNOSIS — K920 Hematemesis: Secondary | ICD-10-CM | POA: Diagnosis present

## 2014-09-13 DIAGNOSIS — K766 Portal hypertension: Secondary | ICD-10-CM | POA: Diagnosis present

## 2014-09-13 DIAGNOSIS — I959 Hypotension, unspecified: Secondary | ICD-10-CM

## 2014-09-13 LAB — PROTIME-INR
INR: 1.57 — ABNORMAL HIGH (ref 0.00–1.49)
Prothrombin Time: 18.9 seconds — ABNORMAL HIGH (ref 11.6–15.2)

## 2014-09-13 LAB — COMPREHENSIVE METABOLIC PANEL
ALT: 28 U/L (ref 0–53)
AST: 60 U/L — AB (ref 0–37)
Albumin: 2.9 g/dL — ABNORMAL LOW (ref 3.5–5.2)
Alkaline Phosphatase: 75 U/L (ref 39–117)
Anion gap: 23 — ABNORMAL HIGH (ref 5–15)
BILIRUBIN TOTAL: 0.6 mg/dL (ref 0.3–1.2)
BUN: 49 mg/dL — ABNORMAL HIGH (ref 6–23)
CALCIUM: 8.8 mg/dL (ref 8.4–10.5)
CHLORIDE: 95 meq/L — AB (ref 96–112)
CO2: 13 meq/L — AB (ref 19–32)
CREATININE: 1.02 mg/dL (ref 0.50–1.35)
GFR, EST AFRICAN AMERICAN: 89 mL/min — AB (ref >90)
GFR, EST NON AFRICAN AMERICAN: 77 mL/min — AB (ref >90)
GLUCOSE: 141 mg/dL — AB (ref 70–99)
Potassium: 5.9 mEq/L — ABNORMAL HIGH (ref 3.7–5.3)
Sodium: 131 mEq/L — ABNORMAL LOW (ref 137–147)
Total Protein: 5.7 g/dL — ABNORMAL LOW (ref 6.0–8.3)

## 2014-09-13 LAB — CBC
HCT: 18.2 % — ABNORMAL LOW (ref 39.0–52.0)
Hemoglobin: 6.1 g/dL — CL (ref 13.0–17.0)
MCH: 31.6 pg (ref 26.0–34.0)
MCHC: 33.5 g/dL (ref 30.0–36.0)
MCV: 94.3 fL (ref 78.0–100.0)
Platelets: 223 10*3/uL (ref 150–400)
RBC: 1.93 MIL/uL — AB (ref 4.22–5.81)
RDW: 16.8 % — ABNORMAL HIGH (ref 11.5–15.5)
WBC: 12.4 10*3/uL — ABNORMAL HIGH (ref 4.0–10.5)

## 2014-09-13 LAB — PREPARE RBC (CROSSMATCH)

## 2014-09-13 LAB — LACTIC ACID, PLASMA: LACTIC ACID, VENOUS: 9.4 mmol/L — AB (ref 0.5–2.2)

## 2014-09-13 LAB — POC OCCULT BLOOD, ED: FECAL OCCULT BLD: POSITIVE — AB

## 2014-09-13 LAB — MRSA PCR SCREENING: MRSA BY PCR: NEGATIVE

## 2014-09-13 MED ORDER — SODIUM CHLORIDE 0.9 % IV SOLN
INTRAVENOUS | Status: AC
  Administered 2014-09-13: 09:00:00 via INTRAVENOUS

## 2014-09-13 MED ORDER — SODIUM CHLORIDE 0.9 % IV SOLN: Freq: Once | INTRAVENOUS | Status: DC

## 2014-09-13 MED ORDER — SODIUM CHLORIDE 0.9 % IV BOLUS (SEPSIS)
1000.0000 mL | Freq: Once | INTRAVENOUS | Status: AC
  Administered 2014-09-13: 1000 mL via INTRAVENOUS

## 2014-09-13 MED ORDER — OCTREOTIDE LOAD VIA INFUSION
50.0000 ug | Freq: Once | INTRAVENOUS | Status: AC
  Administered 2014-09-13: 50 ug via INTRAVENOUS
  Filled 2014-09-13: qty 25

## 2014-09-13 MED ORDER — ONDANSETRON HCL 4 MG/2ML IJ SOLN
4.0000 mg | Freq: Four times a day (QID) | INTRAMUSCULAR | Status: DC | PRN
  Administered 2014-09-13: 4 mg via INTRAVENOUS
  Filled 2014-09-13: qty 2

## 2014-09-13 MED ORDER — ONDANSETRON HCL 4 MG/2ML IJ SOLN: 4.0000 mg | Freq: Once | INTRAMUSCULAR | Status: DC

## 2014-09-13 MED ORDER — PANTOPRAZOLE SODIUM 40 MG IV SOLR
40.0000 mg | Freq: Once | INTRAVENOUS | Status: AC
  Administered 2014-09-13: 40 mg via INTRAVENOUS
  Filled 2014-09-13: qty 40

## 2014-09-13 MED ORDER — DEXTROSE 5 % IV SOLN
2.0000 g | INTRAVENOUS | Status: DC
  Administered 2014-09-13 – 2014-09-15 (×3): 2 g via INTRAVENOUS
  Filled 2014-09-13 (×4): qty 2

## 2014-09-13 MED ORDER — SODIUM CHLORIDE 0.9 % IV SOLN
8.0000 mg/h | INTRAVENOUS | Status: DC
  Administered 2014-09-13 (×2): 8 mg/h via INTRAVENOUS
  Filled 2014-09-13 (×7): qty 80

## 2014-09-13 MED ORDER — SODIUM CHLORIDE 0.9 % IV SOLN
50.0000 ug/h | INTRAVENOUS | Status: DC
  Administered 2014-09-13 – 2014-09-14 (×3): 50 ug/h via INTRAVENOUS
  Filled 2014-09-13 (×6): qty 1

## 2014-09-13 MED ORDER — METOCLOPRAMIDE HCL 5 MG/ML IJ SOLN
10.0000 mg | Freq: Once | INTRAMUSCULAR | Status: AC
  Administered 2014-09-13: 10 mg via INTRAVENOUS
  Filled 2014-09-13: qty 2

## 2014-09-13 MED ORDER — DEXTROSE 5 % IV SOLN
2.0000 g | INTRAVENOUS | Status: DC
  Filled 2014-09-13: qty 2

## 2014-09-13 MED ORDER — VITAMIN K1 10 MG/ML IJ SOLN
10.0000 mg | Freq: Once | INTRAMUSCULAR | Status: AC
  Administered 2014-09-13: 10 mg via SUBCUTANEOUS
  Filled 2014-09-13: qty 1

## 2014-09-13 MED ORDER — ACETAMINOPHEN 325 MG PO TABS
650.0000 mg | ORAL_TABLET | Freq: Four times a day (QID) | ORAL | Status: DC | PRN
  Administered 2014-09-15: 650 mg via ORAL
  Filled 2014-09-13: qty 2

## 2014-09-13 MED ORDER — ONDANSETRON HCL 4 MG/2ML IJ SOLN
4.0000 mg | Freq: Once | INTRAMUSCULAR | Status: AC
  Administered 2014-09-13: 4 mg via INTRAVENOUS
  Filled 2014-09-13: qty 2

## 2014-09-13 MED ORDER — ONDANSETRON HCL 4 MG PO TABS: 4.0000 mg | ORAL_TABLET | Freq: Four times a day (QID) | ORAL | Status: DC | PRN

## 2014-09-13 MED ORDER — ALUM & MAG HYDROXIDE-SIMETH 200-200-20 MG/5ML PO SUSP: 30.0000 mL | Freq: Four times a day (QID) | ORAL | Status: DC | PRN

## 2014-09-13 MED ORDER — HYDROMORPHONE HCL 1 MG/ML IJ SOLN
1.0000 mg | INTRAMUSCULAR | Status: DC | PRN
  Administered 2014-09-15 – 2014-09-16 (×3): 1 mg via INTRAVENOUS
  Filled 2014-09-13 (×3): qty 1

## 2014-09-13 MED ORDER — SODIUM CHLORIDE 0.9 % IV SOLN
INTRAVENOUS | Status: DC
  Administered 2014-09-14 (×2): via INTRAVENOUS

## 2014-09-13 MED ORDER — SODIUM CHLORIDE 0.9 % IV SOLN
Freq: Once | INTRAVENOUS | Status: AC
  Administered 2014-09-13: 14:00:00 via INTRAVENOUS

## 2014-09-13 MED ORDER — ACETAMINOPHEN 650 MG RE SUPP: 650.0000 mg | Freq: Four times a day (QID) | RECTAL | Status: DC | PRN
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
## 2014-09-13 NOTE — ED Notes (Signed)
64 yo male from home with Upper and Lower GI bleeding beginning at 0330. Dark tarry stool x 3 and Hematemesis with clots x3. HX of GI bleed in May and post intubation esophageal tear. Pt pale and clammy reports falling a couple of times, suffered a right arm lac. Alert/Oriented x 4 Wife at bedside has early onset of alzheimer's. 12 Lead unremarkable. Vitals stable. Pain 3/10

## 2014-09-13 NOTE — Plan of Care (Signed)
Problem: Phase I Progression Outcomes Goal: Voiding-avoid urinary catheter unless indicated Outcome: Completed/Met Date Met:  09/13/14     

## 2014-09-13 NOTE — ED Notes (Signed)
Call placed to Pharmacy

## 2014-09-13 NOTE — ED Provider Notes (Signed)
CSN: [id number]     Arrival date & time 09/13/14  9326 History   First MD Initiated Contact with Patient 09/13/14 0739     Chief Complaint  Patient presents with  . GI Bleeding     (Consider location/radiation/quality/duration/timing/severity/associated sxs/prior Treatment) Patient is a 63 y.o. male presenting with hematochezia. The history is provided by the patient. No language interpreter was used.  Rectal Bleeding Quality:  Black and tarry Amount:  Moderate Duration:  4 hours Timing:  Constant Progression:  Worsening Chronicity:  New Context: spontaneously   Context: not diarrhea and not rectal injury   Similar prior episodes: no   Relieved by:  Nothing Worsened by:  Nothing tried Ineffective treatments:  None tried Associated symptoms: dizziness, hematemesis, light-headedness and loss of consciousness   Associated symptoms: no abdominal pain, no epistaxis, no fever, no recent illness and no vomiting   Risk factors: liver disease (hep C)   Risk factors: no NSAID use     Past Medical History  Diagnosis Date  . Hypertension   . Osteoarthritis   . Blood transfusion without reported diagnosis     during hip replacement 2012  . Hepatitis C    Past Surgical History  Procedure Laterality Date  . Hip surgery  2012    right  . Laparoscopic cholecystectomy  2008  . Fracture surgery  1975    tib/fib// right  . Leg infection  2014  . External fixation leg Right 02/25/2014    Procedure: EXTERNAL FIXATION LEG;  Surgeon: Marianna Payment, MD;  Location: [facility];  Service: Orthopedics;  Laterality: Right;   Family History  Problem Relation Age of Onset  . Colon cancer Neg Hx   . Arthritis Mother   . Hypertension Father   . Arthritis Brother   . Hypertension Brother    History  Substance Use Topics  . Smoking status: Never Smoker   . Smokeless tobacco: Never Used  . Alcohol Use: 4.2 oz/week    7 Cans of beer per week    Review of Systems  Constitutional: Negative  for fever.  HENT: Negative for congestion, nosebleeds, rhinorrhea and sore throat.   Respiratory: Positive for shortness of breath. Negative for cough.   Cardiovascular: Negative for chest pain.  Gastrointestinal: Positive for blood in stool, hematochezia and hematemesis. Negative for nausea, vomiting, abdominal pain and diarrhea.  Genitourinary: Negative for dysuria and hematuria.  Skin: Negative for rash.  Neurological: Positive for dizziness, loss of consciousness, syncope and light-headedness. Negative for headaches.  All other systems reviewed and are negative.     Allergies  Review of patient's allergies indicates no known allergies.  Home Medications   Prior to Admission medications   Medication Sig Start Date End Date Taking? Authorizing Provider  atenolol (TENORMIN) 50 MG tablet Take 50 mg by mouth at bedtime.    Historical Provider, MD  cloNIDine (CATAPRES) 0.2 MG tablet Take 0.2 mg by mouth 3 (three) times daily. 01/03/14   Jearld Fenton, NP  lisinopril (PRINIVIL,ZESTRIL) 40 MG tablet Take 40 mg by mouth at bedtime.    Historical Provider, MD   BP 80/48 mmHg  Pulse 101  Temp(Src) 97.8 F (36.6 C) (Oral)  Resp 20  SpO2 98% Physical Exam  Constitutional: He is oriented to person, place, and time. He appears well-developed and well-nourished.  Ill appearing  HENT:  Head: Normocephalic and atraumatic.  Right Ear: External ear normal.  Left Ear: External ear normal.  Eyes: EOM are normal. Pupils are  equal, round, and reactive to light.  Neck: Normal range of motion. Neck supple.  Cardiovascular: Normal rate, regular rhythm, normal heart sounds and intact distal pulses.  Exam reveals no gallop and no friction rub.   No murmur heard. tachycardic  Pulmonary/Chest: Effort normal and breath sounds normal. No respiratory distress. He has no wheezes. He has no rales. He exhibits no tenderness.  Abdominal: Soft. Bowel sounds are normal. He exhibits no distension. There is  tenderness (diffusely). There is no rebound.  Genitourinary:  Dark tarry stool  Musculoskeletal: Normal range of motion. He exhibits no edema or tenderness.  Lymphadenopathy:    He has no cervical adenopathy.  Neurological: He is alert and oriented to person, place, and time.  Skin: Skin is warm. No rash noted. There is pallor.  Abrasion right elbow  Psychiatric: He has a normal mood and affect. His behavior is normal.  Nursing note and vitals reviewed.   ED Course  Procedures (including critical care time) Labs Review Labs Reviewed  CBC - Abnormal; Notable for the following:    WBC 12.4 (*)    RBC 1.93 (*)    Hemoglobin 6.1 (*)    HCT 18.2 (*)    RDW 16.8 (*)    All other components within normal limits  COMPREHENSIVE METABOLIC PANEL - Abnormal; Notable for the following:    Sodium 131 (*)    Potassium 5.9 (*)    Chloride 95 (*)    CO2 13 (*)    Glucose, Bld 141 (*)    BUN 49 (*)    Total Protein 5.7 (*)    Albumin 2.9 (*)    AST 60 (*)    GFR calc non Af Amer 77 (*)    GFR calc Af Amer 89 (*)    Anion gap 23 (*)    All other components within normal limits  LACTIC ACID, PLASMA - Abnormal; Notable for the following:    Lactic Acid, Venous 9.4 (*)    All other components within normal limits  PROTIME-INR - Abnormal; Notable for the following:    Prothrombin Time 18.9 (*)    INR 1.57 (*)    All other components within normal limits  POC OCCULT BLOOD, ED - Abnormal; Notable for the following:    Fecal Occult Bld POSITIVE (*)    All other components within normal limits  URINE CULTURE  TYPE AND SCREEN  PREPARE RBC (CROSSMATCH)  PREPARE FRESH FROZEN PLASMA  PREPARE RBC (CROSSMATCH)    Imaging Review No results found.   EKG Interpretation   Date/Time:  Saturday September 13 2014 07:32:23 EST Ventricular Rate:  108 PR Interval:  144 QRS Duration: 89 QT Interval:  356 QTC Calculation: 477 R Axis:   77 Text Interpretation:  Sinus tachycardia Abnormal R-wave  progression, early  transition Borderline prolonged QT interval Confirmed by Beau Fanny  MD,  DOUGLAS ([id number]) on 09/13/2014 12:02:55 PM      MDM   Final diagnoses:  None    7:59 AM Pt is a 63 y.o. male with pertinent PMHX of Hep C, HTN, EGD for mallory Weiss tear who presents to the ED with vomiting blood and dark tarry stools. Concern for upper GI bleed. Sudden dark tarry stools 3AM. 2-3 episodes of dark tarry stools. Endorses vomiting bright red blood 3-4 times around the same time. Feeling nausea and lightheaded since 7PM last night. 1 episode of syncope a few hours ago. No chest pain but endorses shortness of breath. No URI or  fevers. No dysuria. No blood in urine. Not on blood thinners. history of daily alcohol use, last drink 3 days ago. Drink 1-2 12 oz beers a day. No NSAID abuse   On exam: ill appearing, pale. Tachycardic. BP 60V systolic. GU exam: dark tarry stools. Diffuse tenderness. Concern for upper GI bleed. History of mallory weiss. No documented varicies on EGD. Plan for CBc, CMP, lactic acid, type and screen.   EKG personally reviewed by myself showed sinus tachycardia, poor r-wave progression Rate of 108, PR 134ms, QRS 77ms QT/QTC 356/420ms, normal axis, without evidence of new ischemia. Comparison showed similar, indication: GI bleed  Procedure note: Ultrasound Guided Peripheral IV Ultrasound guided 18 g peripheral 1.88 inch angiocath IV placement performed by me. Indications: Nursing unable to place IV, need for access. Details: The Left antecubital fossa and upper arm was evaluated with a multifrequency linear probe. Several patent brachial veins are noted. 1 attempts were made to cannulate a Left vein under realtime US guidance with successful cannulation of the vein and catheter placement. There is return of non-pulsatile dark red blood. The patient tolerated the procedure well without complications. An ultrasound image is not archived.  Discussed with [facility]: will start  protonix drip, will also start octreotide drip. Pressure improved to 371 systolic after fluids  Review of labs: POCt hemo-occult stool: positive CBC: H&H 6.1/18.2  CMP: hyponatremia, hyperkalemia, hypochloremia. Elevated AST. Cr 1.02 Lactic acid: 9.4 INR: 1.57  Plan to transfuse 2 units blood. Plan for admission to step down. Will consult hospitalist for admission  Plan for admission to step down. Currently hemodynamically stable. GI to see patient  Labs, EKG and imaging reviewed by myself and considered in medical decision making if ordered.  Imaging interpreted by radiology. Pt was discussed with my attending, Dr. Samson Frederic, MD 09/13/14 [place], MD 09/14/14 [phone]

## 2014-09-13 NOTE — ED Notes (Signed)
Blood bank called
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
## 2014-09-13 NOTE — ED Notes (Signed)
MD at bedside. 

## 2014-09-13 NOTE — ED Notes (Signed)
Meds not compatible. Consulting PA.

## 2014-09-13 NOTE — Progress Notes (Signed)
ANTIBIOTIC CONSULT NOTE - INITIAL  Pharmacy Consult for ceftriaxone Indication: intra-abdominal infection  No Known Allergies  Patient Measurements: Height: 5\' 11"  (180.3 cm) Weight: 192 lb 7.4 oz (87.3 kg) IBW/kg (Calculated) : 75.3  Vital Signs: Temp: 99 F (37.2 C) (11/07 1404) Temp Source: Oral (11/07 1404) BP: 128/75 mmHg (11/07 1404) Pulse Rate: 113 (11/07 1404) Intake/Output from previous day:   Intake/Output from this shift: Total I/O In: 250 [I.V.:250] Out: -   Labs:  Recent Labs  09/13/14 0756  WBC 12.4*  HGB 6.1*  PLT 223  CREATININE 1.02   Estimated Creatinine Clearance: 80 mL/min (by C-G formula based on Cr of 1.02). No results for input(s): VANCOTROUGH, VANCOPEAK, VANCORANDOM, GENTTROUGH, GENTPEAK, GENTRANDOM, TOBRATROUGH, TOBRAPEAK, TOBRARND, AMIKACINPEAK, AMIKACINTROU, AMIKACIN in the last 72 hours.   Microbiology: No results found for this or any previous visit (from the past 720 hour(s)).  Medical History: Past Medical History  Diagnosis Date  . Hypertension   . Osteoarthritis   . Blood transfusion without reported diagnosis     during hip replacement 2012  . Hepatitis C    Assessment: 68 yom presenting 11/7 w/ UGIB, acute blood loss anemia, and hypovolemic shock. PMH: esophageal tear, chronic hepatitis C, no prior history of esophageal varices. Endoscopy scheduled for today. Pharmacy consulted to dose ceftriaxone for GI bleed/cirrhosis. Afeb, wbc elevated 12.4. SCr stable wnl. No cultures. Ceftriaxone is not dose-adjusted for renal function. Pharmacy will sign off  Goal of Therapy:  Eradication of infection  Plan:  - Ceftriaxone 2g q24h x 5d - Monitor clinical progress  Elicia Lamp, PharmD Clinical Pharmacist - Resident Pager [phone] 09/13/2014 2:36 PM

## 2014-09-13 NOTE — ED Notes (Signed)
MD at bedside. Pyrtle

## 2014-09-13 NOTE — ED Notes (Signed)
Right elbow abrasion from fall this morning. Dried blood. Clean dry intact.

## 2014-09-13 NOTE — ED Notes (Signed)
Attempted Report 

## 2014-09-14 ENCOUNTER — Encounter (HOSPITAL_COMMUNITY)
Admission: EM | Disposition: A | Discharge status: Home / Self Care | Payer: Self-pay | Source: Home / Self Care | Attending: Internal Medicine

## 2014-09-14 ENCOUNTER — Encounter (HOSPITAL_COMMUNITY): Payer: Self-pay | Admitting: Internal Medicine

## 2014-09-14 HISTORY — PX: ESOPHAGOGASTRODUODENOSCOPY: SHX5428

## 2014-09-14 LAB — HEMOGLOBIN AND HEMATOCRIT, BLOOD
HCT: 19.3 % — ABNORMAL LOW (ref 39.0–52.0)
HCT: 26.9 % — ABNORMAL LOW (ref 39.0–52.0)
HCT: 28.1 % — ABNORMAL LOW (ref 39.0–52.0)
HEMATOCRIT: 21.6 % — AB (ref 39.0–52.0)
HEMOGLOBIN: 7.3 g/dL — AB (ref 13.0–17.0)
HEMOGLOBIN: 9.3 g/dL — AB (ref 13.0–17.0)
Hemoglobin: 6.6 g/dL — CL (ref 13.0–17.0)
Hemoglobin: 9.7 g/dL — ABNORMAL LOW (ref 13.0–17.0)

## 2014-09-14 LAB — BASIC METABOLIC PANEL
ANION GAP: 14 (ref 5–15)
BUN: 51 mg/dL — ABNORMAL HIGH (ref 6–23)
CO2: 17 mEq/L — ABNORMAL LOW (ref 19–32)
Calcium: 8.6 mg/dL (ref 8.4–10.5)
Chloride: 105 mEq/L (ref 96–112)
Creatinine, Ser: 0.98 mg/dL (ref 0.50–1.35)
GFR, EST NON AFRICAN AMERICAN: 86 mL/min — AB (ref >90)
GLUCOSE: 116 mg/dL — AB (ref 70–99)
POTASSIUM: 4.7 meq/L (ref 3.7–5.3)
Sodium: 136 mEq/L — ABNORMAL LOW (ref 137–147)

## 2014-09-14 LAB — PREPARE FRESH FROZEN PLASMA
UNIT DIVISION: 0
UNIT DIVISION: 0

## 2014-09-14 SURGERY — EGD (ESOPHAGOGASTRODUODENOSCOPY)
Anesthesia: Moderate Sedation

## 2014-09-14 MED ORDER — SODIUM CHLORIDE 0.9 % IV SOLN: Freq: Once | INTRAVENOUS | Status: DC

## 2014-09-14 MED ORDER — FENTANYL CITRATE 0.05 MG/ML IJ SOLN
INTRAMUSCULAR | Status: AC
  Filled 2014-09-14: qty 4

## 2014-09-14 MED ORDER — BUTAMBEN-TETRACAINE-BENZOCAINE 2-2-14 % EX AERO
INHALATION_SPRAY | CUTANEOUS | Status: DC | PRN
  Administered 2014-09-14: 1 via TOPICAL

## 2014-09-14 MED ORDER — DIPHENHYDRAMINE HCL 50 MG/ML IJ SOLN
INTRAMUSCULAR | Status: DC | PRN
  Administered 2014-09-14: 12.5 mg via INTRAVENOUS

## 2014-09-14 MED ORDER — MIDAZOLAM HCL 5 MG/ML IJ SOLN
INTRAMUSCULAR | Status: AC
  Filled 2014-09-14: qty 3

## 2014-09-14 MED ORDER — MIDAZOLAM HCL 10 MG/2ML IJ SOLN
INTRAMUSCULAR | Status: DC | PRN
  Administered 2014-09-14: 1 mg via INTRAVENOUS
  Administered 2014-09-14 (×2): 2 mg via INTRAVENOUS

## 2014-09-14 MED ORDER — PANTOPRAZOLE SODIUM 40 MG PO TBEC
40.0000 mg | DELAYED_RELEASE_TABLET | Freq: Every day | ORAL | Status: DC
  Administered 2014-09-14 – 2014-09-16 (×3): 40 mg via ORAL
  Filled 2014-09-14 (×3): qty 1

## 2014-09-14 MED ORDER — FENTANYL CITRATE 0.05 MG/ML IJ SOLN
INTRAMUSCULAR | Status: DC | PRN
  Administered 2014-09-14: 25 ug via INTRAVENOUS

## 2014-09-14 MED ORDER — CLONIDINE HCL 0.2 MG PO TABS
0.2000 mg | ORAL_TABLET | Freq: Three times a day (TID) | ORAL | Status: DC
  Administered 2014-09-14 – 2014-09-16 (×5): 0.2 mg via ORAL
  Filled 2014-09-14 (×4): qty 1
  Filled 2014-09-14: qty 2
  Filled 2014-09-14 (×3): qty 1

## 2014-09-14 MED ORDER — SODIUM CHLORIDE 0.9 % IV SOLN: INTRAVENOUS | Status: DC

## 2014-09-14 MED ORDER — DIPHENHYDRAMINE HCL 50 MG/ML IJ SOLN
INTRAMUSCULAR | Status: AC
  Filled 2014-09-14: qty 1

## 2014-09-14 NOTE — Op Note (Signed)
[facility] [place], [address]   ENDOSCOPY PROCEDURE REPORT  PATIENT: Joshua Burton, Joshua Burton  MR#: #[id number] BIRTHDATE: 1950/12/27 , 23  yrs. old GENDER: male ENDOSCOPIST: Jerene Bears, MD REFERRED BY:  [facility] PROCEDURE DATE:  09/14/2014 PROCEDURE:  EGD, diagnostic ASA CLASS:     Class IV INDICATIONS:  hematemesis.  patient with Hep C cirrhosis and portal hypertension MEDICATIONS: Benadryl 12.5 mg IV, Fentanyl 25 mcg IV, and Versed 4 mg IV TOPICAL ANESTHETIC: Cetacaine Spray  DESCRIPTION OF PROCEDURE: After the risks benefits and alternatives of the procedure were thoroughly explained, informed consent was obtained.  The Pentax Gastroscope [id number] endoscope was introduced through the mouth and advanced to the second portion of the duodenum , Without limitations.  The instrument was slowly withdrawn as the mucosa was fully examined.   ESOPHAGUS: The mucosa of the esophagus appeared normal.  Very small, grade 1, distal esophageal varices without stigmata of bleeding. Varices are too small for band ligation.  STOMACH: The mucosa of the stomach appeared normal.   No evidence of gastric varices or portal hypertensive gastropathy; no evidence of active or recent bleeding  DUODENUM: The duodenal mucosa showed no abnormalities in the bulb and 2nd part of the duodenum.  Retroflexed views revealed no abnormalities.     The scope was then withdrawn from the patient and the procedure completed.  COMPLICATIONS: There were no immediate complications.  ENDOSCOPIC IMPRESSION: 1.   The mucosa of the esophagus appeared normal; very small esophageal varices without stigmata of bleeding (most likely source of recent bleeding, though very small today and not amenable to endoscopic band ligation) 2.   The mucosa of the stomach appeared normal 3.   The duodenal mucosa showed no abnormalities in the bulb and 2nd part of the  duodenum  RECOMMENDATIONS: 1.  Complete 2 additional units of pRBC, monitor Hgb and for rebleeding 2.  Discontinue octreotide and pantoprazole infusion 3.  Once stable would add nadolol titrated to resting heart rate around 60 as tolerated by blood pressure for varices in patient with cirrhosis  eSigned:  Jerene Bears, MD 09/14/2014 11:50 AM     CC: The Patient

## 2014-09-14 NOTE — H&P (View-Only) (Signed)
Referring Provider: [facility] Primary Care Physician:  Gerrit Heck, MD Primary Gastroenterologist: unassigned  Reason for Consultation:   GI bleeding    HPI: Joshua Burton is a 63 y.o. male  Who presented to the emergency room with hematochezia. He states that yesterday he woke up feeling very weak and lightheaded all day. He had no chest pain and no shortness of breath. He began to vomit and vomited what he describes as a moderate amount of bright red blood. He then began to pass black tarry stools per rectum around 3 am. He felt extremely weak and was transported to the ER via EMS. He was found to be hypotensive with a systolic blood pressure in the 80s on arrival to the emergency room.He was found to have a Hgb of 6.1, with a BUN of 49 and creat of 1.02. He denies use of NSAIDS.He has not had a fever, or recent illness. He does have a history of Hep C.He reports that he has had a mallory weiss tear in the past but says it was long ago.Uses alcohol daily--last drink 3 days ago, has 1 or 2 beers daily. Denies a history of varices.   Past Medical History  Diagnosis Date  . Hypertension   . Osteoarthritis   . Blood transfusion without reported diagnosis     during hip replacement 2012  . Hepatitis C     Past Surgical History  Procedure Laterality Date  . Hip surgery  2012    right  . Laparoscopic cholecystectomy  2008  . Fracture surgery  1975    tib/fib// right  . Leg infection  2014  . External fixation leg Right 02/25/2014    Procedure: EXTERNAL FIXATION LEG;  Surgeon: Marianna Payment, MD;  Location: [place];  Service: Orthopedics;  Laterality: Right;    Prior to Admission medications   Medication Sig Start Date End Date Taking? Authorizing Provider  atenolol (TENORMIN) 50 MG tablet Take 50 mg by mouth at bedtime.    Historical Provider, MD  cloNIDine (CATAPRES) 0.2 MG tablet Take 0.2 mg by mouth 3 (three) times daily. 01/03/14   Jearld Fenton, NP    lisinopril (PRINIVIL,ZESTRIL) 40 MG tablet Take 40 mg by mouth at bedtime.    Historical Provider, MD    Current Facility-Administered Medications  Medication Dose Route Frequency Provider Last Rate Last Dose  . 0.9 %  sodium chloride infusion   Intravenous Once Joanell Rising, MD      . 0.9 %  sodium chloride infusion   Intravenous STAT Joanell Rising, MD      . octreotide (SANDOSTATIN) 2 mcg/mL load via infusion 50 mcg  50 mcg Intravenous Once Joanell Rising, MD       And  . octreotide (SANDOSTATIN) 500 mcg in sodium chloride 0.9 % 250 mL (2 mcg/mL) infusion  50 mcg/hr Intravenous Continuous Joanell Rising, MD      . pantoprazole (PROTONIX) 80 mg in sodium chloride 0.9 % 250 mL (0.32 mg/mL) infusion  8 mg/hr Intravenous Continuous Joanell Rising, MD       Current Outpatient Prescriptions  Medication Sig Dispense Refill  . atenolol (TENORMIN) 50 MG tablet Take 50 mg by mouth at bedtime.    . cloNIDine (CATAPRES) 0.2 MG tablet Take 0.2 mg by mouth 3 (three) times daily.    Marland Kitchen lisinopril (PRINIVIL,ZESTRIL) 40 MG tablet Take 40 mg by mouth at bedtime.      Allergies as of 09/13/2014  . (  No Known Allergies)    Family History  Problem Relation Age of Onset  . Colon cancer Neg Hx   . Arthritis Mother   . Hypertension Father   . Arthritis Brother   . Hypertension Brother     History   Social History  . Marital Status: Divorced    Spouse Name: N/A    Number of Children: N/A  . Years of Education: 13   Occupational History  . Retired     Agricultural engineer   Social History Main Topics  . Smoking status: Never Smoker   . Smokeless tobacco: Never Used  . Alcohol Use: 4.2 oz/week    7 Cans of beer per week  . Drug Use: No  . Sexual Activity: Yes    Birth Control/ Protection: Condom   Other Topics Concern  . Not on file   Social History Narrative   Regular exercise-yes   Caffeine Use-no    Review of Systems: EZM:OQHUTM to fatigue and weakness CV:  Denies chest pain, angina, palpitations, syncope, orthopnea, PND, peripheral edema, and claudication. Resp: Denies dyspnea at rest, dyspnea with exercise, cough, sputum, wheezing, coughing up blood, and pleurisy. GI: Admits to vomiting blood and has black tarry stools.  GU : Denies urinary burning, blood in urine, urinary frequency, urinary hesitancy, nocturnal urination, and urinary incontinence. MS: Denies joint pain, limitation of movement, and swelling, stiffness, low back pain, extremity pain. Denies muscle weakness, cramps, atrophy.  Derm: Denies rash, itching, dry skin, hives, moles, warts, or unhealing ulcers.  Psych: Denies depression, anxiety, memory loss, suicidal ideation, hallucinations, paranoia, and confusion. Heme: Denies bruising, bleeding, and enlarged lymph nodes. Neuro:  Denies any headaches, dizziness, paresthesias. Endo:  Denies any problems with DM, thyroid, adrenal function.  Physical Exam: Vital signs in last 24 hours: Temp:  [97.8 F (36.6 C)] 97.8 F (36.6 C) (11/07 0742) Pulse Rate:  [101-110] 110 (11/07 0830) Resp:  [20-21] 20 (11/07 0830) BP: (80-103)/(48-55) 103/55 mmHg (11/07 0830) SpO2:  [93 %-100 %] 100 % (11/07 0830) Weight:  [200 lb (90.719 kg)] 200 lb (90.719 kg) (11/07 0856)   General:   Alert,  Well-developed, well-nourished, pleasant and cooperative in NAD Head:  Normocephalic and atraumatic. Eyes:  Sclera clear, no icterus.  Conjunctiva pink. Ears:  Normal auditory acuity. Nose:  No deformity, discharge,  or lesions. Mouth:  No deformity or lesions.   Neck:  Supple; no masses or thyromegaly. Lungs:  Clear throughout to auscultation.   No wheezes, crackles, or rhonchi  Heart:  Regular rate and rhythm; no murmurs, clicks, rubs,  or gallops.tachycardic Abdomen:  Soft,diffuse tenderness , BS active,nonpalp mass or hsm.   Rectal:  Deferred as done by ER--black tarry stool Msk:  Symmetrical without gross deformities. . Pulses:  Normal pulses  noted. Extremities:  Without clubbing or edema. Neurologic:  Alert and  oriented x4;  grossly normal neurologically. Skin:  Intact without significant lesions or rashes.Pale.Marland Kitchen Psych:  Alert and cooperative. Normal mood and affect.     Lab Results:  Recent Labs  09/13/14 0756  WBC 12.4*  HGB 6.1*  HCT 18.2*  PLT 223   BMET  Recent Labs  09/13/14 0756  NA 131*  K 5.9*  CL 95*  CO2 13*  GLUCOSE 141*  BUN 49*  CREATININE 1.02  CALCIUM 8.8   LFT  Recent Labs  09/13/14 0756  PROT 5.7*  ALBUMIN 2.9*  AST 60*  ALT 28  ALKPHOS 75  BILITOT 0.6    IMPRESSION/PLAN:63 year old  male with a past medical history of hypertension, hepatitis C, status post EGD for Mallory-Weiss tear a number of years ago, who presented to the emergency room today with hematemesis and passage of dark tarry stools. His symptoms are suggestive of an upper GI bleed. His hemoglobin is noted to be 6 and 2 units of blood have been ordered. He has been started on IV Protonix as well as Sandostatin. Will review patient with Dr. Hilarie Fredrickson.. Patient will likely need upper endoscopy later today after pt has been transfused.    Valeri Sula, Deloris Ping 09/13/2014,  Pager [phone]

## 2014-09-14 NOTE — Interval H&P Note (Signed)
History and Physical Interval Note: Pt for EGD today to eval UGI bleeding.  He received 2 u of pRBC overnight, Hgb up to 7.2, now 6.6.  2 additional units of pRBC ordered now.  1 to begin before EGD.  Has been on ppi gtt and octreotide gtt since admission. No hypotension this morning.  No further vomiting since ED and no melena or even BM since admit.  He denies pain or nausea now.  The nature of the procedure, as well as the risks, benefits, and alternatives were carefully and thoroughly reviewed with the patient. Ample time for discussion and questions allowed. The patient understood, was satisfied, and agreed to proceed.  EGD to be performed at bedside    09/14/2014 10:03 AM  Joshua Burton  has presented today for surgery, with the diagnosis of hematemesis  The various methods of treatment have been discussed with the patient and family. After consideration of risks, benefits and other options for treatment, the patient has consented to  Procedure(s): ESOPHAGOGASTRODUODENOSCOPY (EGD) (N/A) as a surgical intervention .  The patient's history has been reviewed, patient examined, no change in status, stable for surgery.  I have reviewed the patient's chart and labs.  Questions were answered to the patient's satisfaction.     Nyeshia Mysliwiec M

## 2014-09-14 NOTE — Progress Notes (Signed)
CRITICAL VALUE ALERT  Critical value received:  hgb 6.6  Date of notification:  09/14/14  Time of notification:  1000  Critical value read back:Yes.    Nurse who received alert:  Curt Bears RN  MD notified (1st page):  Rai   Time of first page:  1010  MD notified (2nd page):  Time of second page:  Responding MD:  Tana Coast  Time MD responded:  [phone]

## 2014-09-14 NOTE — Progress Notes (Signed)
Patient ID: Joshua Burton  male  MHD:[id number]    DOB: 01-Dec-1950    DOA: 09/13/2014  PCP: Gerrit Heck, MD  Brief history of present illness  Patient is a 63 year old male with history of hypertension, prostatitis, chronic hepatitis C, cirrhosis presented to ED with diarrhea, bloody stools and hematemesis. Hemoglobin was 6.1 with systolic BP in 21J. Patient was admitted to stepdown unit. GI consulted.  Assessment/Plan: Principal Problem:   Upper GI bleed/acute blood loss anemia with hypovolemic shock - hemoglobin still down to 6.6, Status post 2 units packed RBC transfusion, 2 units FFP - Will transfuse 2 units again - GI consulted, patient to undergo EGD today, continue PPI drip, octreotide   Active Problems:   Hyponatremia - improving    Hypotension - Due to #1, improving    Cirrhosis With  Portal hypertension, chronic hepatitis C - patient placed on Rocephin, possible concern for SBP - defer further management to GI  DVT Prophylaxis: sCD's  Code Status:full code  Family Communication:  Disposition:  Consultants:  gastroenterology  Procedures:  endoscopy  Antibiotics:  IV Rocephin    Subjective: Patient seen and examined earlier this morning, denies any active bleeding, feels overall better from yesterday  Objective: Weight change:   Intake/Output Summary (Last 24 hours) at 09/14/14 1005 Last data filed at 09/14/14 0700  Gross per 24 hour  Intake 2616.5 ml  Output   2876 ml  Net -259.5 ml   Blood pressure 135/77, pulse 94, temperature 98.2 F (36.8 C), temperature source Oral, resp. rate 17, height 5\' 11"  (1.803 m), weight 87.3 kg (192 lb 7.4 oz), SpO2 100 %.  Physical Exam: General: Alert and awake, oriented x3, not in any acute distress. CVS: S1-S2 clear, no murmur rubs or gallops Chest: clear to auscultation bilaterally, no wheezing, rales or rhonchi Abdomen: soft mildly tender diffusely, nondistended, normal bowel sounds    Extremities: no cyanosis, clubbing or edema noted bilaterally Neuro: Cranial nerves II-XII intact, no focal neurological deficits  Lab Results: Basic Metabolic Panel:  Recent Labs Lab 09/13/14 0756 09/14/14 0315  NA 131* 136*  K 5.9* 4.7  CL 95* 105  CO2 13* 17*  GLUCOSE 141* 116*  BUN 49* 51*  CREATININE 1.02 0.98  CALCIUM 8.8 8.6   Liver Function Tests:  Recent Labs Lab 09/13/14 0756  AST 60*  ALT 28  ALKPHOS 75  BILITOT 0.6  PROT 5.7*  ALBUMIN 2.9*   No results for input(s): LIPASE, AMYLASE in the last 168 hours. No results for input(s): AMMONIA in the last 168 hours. CBC:  Recent Labs Lab 09/13/14 0756 09/14/14 0315 09/14/14 0853  WBC 12.4*  --   --   HGB 6.1* 7.3* 6.6*  HCT 18.2* 21.6* 19.3*  MCV 94.3  --   --   PLT 223  --   --    Cardiac Enzymes: No results for input(s): CKTOTAL, CKMB, CKMBINDEX, TROPONINI in the last 168 hours. BNP: Invalid input(s): POCBNP CBG: No results for input(s): GLUCAP in the last 168 hours.   Micro Results: Recent Results (from the past 240 hour(s))  MRSA PCR Screening     Status: None   Collection Time: 09/13/14  3:15 PM  Result Value Ref Range Status   MRSA by PCR NEGATIVE NEGATIVE Final    Comment:        The GeneXpert MRSA Assay (FDA approved for NASAL specimens only), is one component of a comprehensive MRSA colonization surveillance program. It is not intended to  diagnose MRSA infection nor to guide or monitor treatment for MRSA infections.     Studies/Results: No results found.  Medications: Scheduled Meds: . sodium chloride   Intravenous Once  . sodium chloride   Intravenous Once  . cefTRIAXone (ROCEPHIN)  IV  2 g Intravenous Q24H      LOS: 1 day   RAI,RIPUDEEP M.D. [facility] 09/14/2014, 10:05 AM Pager: [phone]  If 7PM-7AM, please contact night-coverage [email or web address] Password [email or web address]

## 2014-09-14 NOTE — Progress Notes (Signed)
Patient hypertensive with SBP 150-190s, asymptomatic. No pain. Noted to be on catapres, lisinopril per PCP and spironolactone per Hep C clinic at home. On - call hospitalist provider notified and orders for catapres 0.2mg  tid received.

## 2014-09-15 ENCOUNTER — Encounter (HOSPITAL_COMMUNITY): Payer: Self-pay | Admitting: Internal Medicine

## 2014-09-15 LAB — BASIC METABOLIC PANEL
Anion gap: 15 (ref 5–15)
BUN: 34 mg/dL — ABNORMAL HIGH (ref 6–23)
CO2: 16 mEq/L — ABNORMAL LOW (ref 19–32)
Calcium: 8.3 mg/dL — ABNORMAL LOW (ref 8.4–10.5)
Chloride: 105 mEq/L (ref 96–112)
Creatinine, Ser: 0.99 mg/dL (ref 0.50–1.35)
GFR calc Af Amer: >90 mL/min (ref >90)
GFR calc non Af Amer: 86 mL/min — ABNORMAL LOW (ref >90)
GLUCOSE: 123 mg/dL — AB (ref 70–99)
POTASSIUM: 4.3 meq/L (ref 3.7–5.3)
SODIUM: 136 meq/L — AB (ref 137–147)

## 2014-09-15 LAB — URINE CULTURE: Colony Count: 9000

## 2014-09-15 LAB — CBC
HCT: 25.6 % — ABNORMAL LOW (ref 39.0–52.0)
Hemoglobin: 9.1 g/dL — ABNORMAL LOW (ref 13.0–17.0)
MCH: 30 pg (ref 26.0–34.0)
MCHC: 35.5 g/dL (ref 30.0–36.0)
MCV: 84.5 fL (ref 78.0–100.0)
Platelets: UNDETERMINED 10*3/uL (ref 150–400)
RBC: 3.03 MIL/uL — ABNORMAL LOW (ref 4.22–5.81)
RDW: 18.4 % — ABNORMAL HIGH (ref 11.5–15.5)
WBC: 10.1 10*3/uL (ref 4.0–10.5)

## 2014-09-15 LAB — HEMOGLOBIN AND HEMATOCRIT, BLOOD
HEMATOCRIT: 25.6 % — AB (ref 39.0–52.0)
HEMOGLOBIN: 8.9 g/dL — AB (ref 13.0–17.0)

## 2014-09-15 MED ORDER — LISINOPRIL 40 MG PO TABS
40.0000 mg | ORAL_TABLET | Freq: Every day | ORAL | Status: DC
  Administered 2014-09-15: 40 mg via ORAL
  Filled 2014-09-15 (×3): qty 1

## 2014-09-15 MED ORDER — NADOLOL 40 MG PO TABS
40.0000 mg | ORAL_TABLET | Freq: Every day | ORAL | Status: DC
  Administered 2014-09-15: 40 mg via ORAL
  Filled 2014-09-15 (×3): qty 1

## 2014-09-15 NOTE — Progress Notes (Signed)
Patient ID: Joshua Burton  male  ZOX:[id number]    DOB: 25-Feb-1951    DOA: 09/13/2014  PCP: Gerrit Heck, MD  Brief history of present illness  Patient is a 62 year old male with history of hypertension, prostatitis, chronic hepatitis C, cirrhosis presented to ED with diarrhea, bloody stools and hematemesis. Hemoglobin was 6.1 with systolic BP in 81X. Patient was admitted to stepdown unit. GI consulted.  Assessment/Plan: Principal Problem:   Upper GI bleed/acute blood loss anemia with hypovolemic shock - hemoglobin now 9.1, status post 4 units packed RBC transfusion, 2 units FFP - patient was placed on IV Protonix drip and octreotide drip, GI was consulted and patient underwent endoscopy 11/8 - EGD showed normal vaginal mucosa, small esophageal varices without stigmata of bleeding, most likely source of recent bleeding, no abnormalities in duodenal mucosa - Started on nadolol today, need to be titrated up  Active Problems:   Hyponatremia - resolved  Hypertension - Restarted clonidine, lisinopril, discontinued atenolol. Placed on nadolol    Cirrhosis With  Portal hypertension, chronic hepatitis C - patient placed on Rocephin, possible concern for SBP - defer further management to GI, GI recommended continuing antibiotics for 5 days, transition to oral antibiotics tomorrow  DVT Prophylaxis: sCD's  Code Status:full code  Family Communication:  Disposition:transfer to telemetry floor, DC home in a.m.  Consultants:  gastroenterology  Procedures:  endoscopy  Antibiotics:  IV Rocephin    Subjective: Patient seen and examined, denies any active bleeding, tolerating oral diet  Objective: Weight change:   Intake/Output Summary (Last 24 hours) at 09/15/14 1019 Last data filed at 09/15/14 0700  Gross per 24 hour  Intake 2237.5 ml  Output   3125 ml  Net -887.5 ml   Blood pressure 142/87, pulse 85, temperature 98.9 F (37.2 C), temperature source Oral,  resp. rate 12, height 5\' 11"  (1.803 m), weight 87.3 kg (192 lb 7.4 oz), SpO2 98 %.  Physical Exam: General: Ax O x3, NAD CVS: S1-S2 clear, no mrg Chest: clear to auscultation bilaterally, no wheezing, rales or rhonchi Abdomen: soft, NT, ND, in BS Extremities: no cyanosis, clubbing or edema noted bilaterally   Lab Results: Basic Metabolic Panel:  Recent Labs Lab 09/14/14 0315 09/15/14 0250  NA 136* 136*  K 4.7 4.3  CL 105 105  CO2 17* 16*  GLUCOSE 116* 123*  BUN 51* 34*  CREATININE 0.98 0.99  CALCIUM 8.6 8.3*   Liver Function Tests:  Recent Labs Lab 09/13/14 0756  AST 60*  ALT 28  ALKPHOS 75  BILITOT 0.6  PROT 5.7*  ALBUMIN 2.9*   No results for input(s): LIPASE, AMYLASE in the last 168 hours. No results for input(s): AMMONIA in the last 168 hours. CBC:  Recent Labs Lab 09/13/14 0756  09/15/14 0714 09/15/14 0805  WBC 12.4*  --   --  10.1  HGB 6.1*  < > 8.9* 9.1*  HCT 18.2*  < > 25.6* 25.6*  MCV 94.3  --   --  84.5  PLT 223  --   --  PLATELET CLUMPS NOTED ON SMEAR, UNABLE TO ESTIMATE  < > = values in this interval not displayed. Cardiac Enzymes: No results for input(s): CKTOTAL, CKMB, CKMBINDEX, TROPONINI in the last 168 hours. BNP: Invalid input(s): POCBNP CBG: No results for input(s): GLUCAP in the last 168 hours.   Micro Results: Recent Results (from the past 240 hour(s))  MRSA PCR Screening     Status: None   Collection Time: 09/13/14  3:15  PM  Result Value Ref Range Status   MRSA by PCR NEGATIVE NEGATIVE Final    Comment:        The GeneXpert MRSA Assay (FDA approved for NASAL specimens only), is one component of a comprehensive MRSA colonization surveillance program. It is not intended to diagnose MRSA infection nor to guide or monitor treatment for MRSA infections.   Urine culture     Status: None   Collection Time: 09/13/14  3:41 PM  Result Value Ref Range Status   Specimen Description URINE, CLEAN CATCH  Final   Special Requests  NONE  Final   Culture  Setup Time   Final    09/13/2014 18:15 Performed at [facility]   Final    9,000 COLONIES/ML Performed at [facility]    Culture   Final    INSIGNIFICANT GROWTH Performed at [facility]    Report Status 09/15/2014 FINAL  Final    Studies/Results: No results found.  Medications: Scheduled Meds: . sodium chloride   Intravenous Once  . sodium chloride   Intravenous Once  . sodium chloride   Intravenous Once  . cefTRIAXone (ROCEPHIN)  IV  2 g Intravenous Q24H  . cloNIDine  0.2 mg Oral TID  . lisinopril  40 mg Oral QHS  . nadolol  40 mg Oral Daily  . pantoprazole  40 mg Oral Q0600      LOS: 2 days   Stephanine Reas M.D. [facility] 09/15/2014, 10:19 AM Pager: [phone]  If 7PM-7AM, please contact night-coverage [email or web address] Password [email or web address]

## 2014-09-15 NOTE — Progress Notes (Signed)
[facility] Progress Note    Since last GI note:  EGD yesterday Dr. Hilarie Fredrickson: ENDOSCOPIC IMPRESSION: 1. The mucosa of the esophagus appeared normal; very small esophageal varices without stigmata of bleeding (most likely source of recent bleeding, though very small today and not amenable to endoscopic band ligation) 2. The mucosa of the stomach appeared normal 3. The duodenal mucosa showed no abnormalities in the bulb and 2nd part of the duodenum  RECOMMENDATIONS: 1. Complete 2 additional units of pRBC, monitor Hgb and for rebleeding 2. Discontinue octreotide and pantoprazole infusion 3. Once stable would add nadolol titrated to resting heart rate around 60 as tolerated by blood pressure for varices in patient with cirrhosis  Had melena overnight (once). This was his first abnormal BM with his overt UGI bleeding.  NO further hematemesis, feels overall much better but still pretty fatigued.  Objective: Vital signs in last 24 hours: Temp:  [97.8 F (36.6 C)-99.4 F (37.4 C)] 98.9 F (37.2 C) (11/09 0721) Pulse Rate:  [85-108] 85 (11/09 0600) Resp:  [11-25] 12 (11/09 0721) BP: (96-196)/(48-114) 142/87 mmHg (11/09 0721) SpO2:  [94 %-100 %] 98 % (11/09 0721) Last BM Date: 09/14/14 General: alert and oriented times 3 Heart: regular rate and rythm Abdomen: soft, non-tender, non-distended, normal bowel sounds   Lab Results:  Recent Labs  09/13/14 0756  09/14/14 1638 09/14/14 2300 09/15/14 0714  WBC 12.4*  --   --   --   --   HGB 6.1*  < > 9.3* 9.7* 8.9*  PLT 223  --   --   --   --   MCV 94.3  --   --   --   --   < > = values in this interval not displayed.  Recent Labs  09/13/14 0756 09/14/14 0315 09/15/14 0250  NA 131* 136* 136*  K 5.9* 4.7 4.3  CL 95* 105 105  CO2 13* 17* 16*  GLUCOSE 141* 116* 123*  BUN 49* 51* 34*  CREATININE 1.02 0.98 0.99  CALCIUM 8.8 8.6 8.3*    Recent Labs  09/13/14 0756  PROT 5.7*  ALBUMIN 2.9*  AST 60*   ALT 28  ALKPHOS 75  BILITOT 0.6    Recent Labs  09/13/14 0750  INR 1.57*     Medications: Scheduled Meds: . sodium chloride   Intravenous Once  . sodium chloride   Intravenous Once  . sodium chloride   Intravenous Once  . cefTRIAXone (ROCEPHIN)  IV  2 g Intravenous Q24H  . cloNIDine  0.2 mg Oral TID  . lisinopril  40 mg Oral QHS  . nadolol  40 mg Oral Daily  . pantoprazole  40 mg Oral Q0600   Continuous Infusions: . sodium chloride 20 mL/hr at 09/14/14 2157   PRN Meds:.acetaminophen **OR** acetaminophen, alum & mag hydroxide-simeth, HYDROmorphone (DILAUDID) injection, ondansetron **OR** ondansetron (ZOFRAN) IV    Assessment/Plan: 63 y.o. male with UGI bleeding, s/p 4 units PRBC, 2 units FFP; seems to have stopped bleeding  Etiology most likely from esophageal varices although admittedly these were small on EGD yesterday without obvious signs of recent bleeding.  No other clear etiology found in UGI tract.  He's clinically stablized. Hb steady.  Was started on nadolol yesterday at 40mg , day.  This dose should be titrated upwards as his HR and BP tolerate over the next 1-2 months. He has hepatologist through [facility] and knows to follow up with them within the next few weeks.  Should continue another 3 days  of oral antibiotics and stay on once daily PPI.  No plans for further GI testing.  OK to d/c him later today or tomorrow per primary service.   Milus Banister, MD  09/15/2014, 8:24 AM [facility] Pager [phone]

## 2014-09-15 NOTE — Progress Notes (Signed)
Pt report called to 6E RN, VSS, all meds current to time. All belongs packed and sent with patient to his new room.  CMT and elink notified

## 2014-09-15 NOTE — Progress Notes (Signed)
Utilization review completed.  

## 2014-09-16 LAB — BASIC METABOLIC PANEL
ANION GAP: 14 (ref 5–15)
BUN: 24 mg/dL — ABNORMAL HIGH (ref 6–23)
CO2: 19 mEq/L (ref 19–32)
Calcium: 7.9 mg/dL — ABNORMAL LOW (ref 8.4–10.5)
Chloride: 102 mEq/L (ref 96–112)
Creatinine, Ser: 1 mg/dL (ref 0.50–1.35)
GFR, EST NON AFRICAN AMERICAN: 79 mL/min — AB (ref >90)
Glucose, Bld: 106 mg/dL — ABNORMAL HIGH (ref 70–99)
POTASSIUM: 4 meq/L (ref 3.7–5.3)
SODIUM: 135 meq/L — AB (ref 137–147)

## 2014-09-16 LAB — CBC
HCT: 25.9 % — ABNORMAL LOW (ref 39.0–52.0)
Hemoglobin: 8.9 g/dL — ABNORMAL LOW (ref 13.0–17.0)
MCH: 29.3 pg (ref 26.0–34.0)
MCHC: 34.4 g/dL (ref 30.0–36.0)
MCV: 85.2 fL (ref 78.0–100.0)
PLATELETS: 147 10*3/uL — AB (ref 150–400)
RBC: 3.04 MIL/uL — ABNORMAL LOW (ref 4.22–5.81)
RDW: 18 % — ABNORMAL HIGH (ref 11.5–15.5)
WBC: 7.6 10*3/uL (ref 4.0–10.5)

## 2014-09-16 MED ORDER — CIPROFLOXACIN HCL 500 MG PO TABS: 500.0000 mg | ORAL_TABLET | Freq: Two times a day (BID) | ORAL | Status: DC

## 2014-09-16 MED ORDER — NADOLOL 40 MG PO TABS: 40.0000 mg | ORAL_TABLET | Freq: Every day | ORAL | Status: DC

## 2014-09-16 MED ORDER — PROMETHAZINE HCL 12.5 MG PO TABS: 12.5000 mg | ORAL_TABLET | Freq: Four times a day (QID) | ORAL | Status: DC | PRN

## 2014-09-16 MED ORDER — PANTOPRAZOLE SODIUM 40 MG PO TBEC: 40.0000 mg | DELAYED_RELEASE_TABLET | Freq: Every day | ORAL | Status: DC

## 2014-09-16 NOTE — Discharge Planning (Signed)
Patient discharged home in stable condition. Verbalizes understanding of all discharge instructions, including home medications and follow up appointments. 

## 2014-09-16 NOTE — Progress Notes (Deleted)
New Admission Note:   Arrival Method:  Via stretcher Mental Orientation: Alert and oriented X4 Telemetry: NSR Assessment: Completed Skin: INTACT IV: NS@50  Pain: NONE Tubes: NONE Safety Measures: Safety Fall Prevention Plan has been given, discussed and signed Admission: Completed [address] Orientation: Patient has been orientated to the room, unit and staff.  Family: Girlfriend at bedside  Orders have been reviewed and implemented. Will continue to monitor the patient. Call light has been placed within reach and bed alarm has been activated.   Dudley Major BSN, Consulting civil engineer number: [phone]

## 2014-09-16 NOTE — Discharge Summary (Signed)
Physician Discharge Summary  Patient ID: DEVAUNTE GASPARINI MRN: [id number] DOB/AGE: [birth year] 63 y.o.  Admit date: 09/13/2014 Discharge date: 09/16/2014  Primary Care Physician:  Gerrit Heck, MD  Discharge Diagnoses:    . Upper GI bleed from esophageal varices . Hyponatremia . Hypotension with hypovolemic shock . Acute blood loss anemia . portal hypertension with cirrhosis History of chronic hepatitis C  Consults:  gastroenterology   Recommendations for Outpatient Follow-up:  Patient was recommended to follow up with his hepatologist, Dr. Orvis Brill in 1-2 weeks  Allergies:  No Known Allergies   Discharge Medications:   Medication List    STOP taking these medications        atenolol 50 MG tablet  Commonly known as:  TENORMIN     lisinopril 40 MG tablet  Commonly known as:  PRINIVIL,ZESTRIL      TAKE these medications        ciprofloxacin 500 MG tablet  Commonly known as:  CIPRO  Take 1 tablet (500 mg total) by mouth 2 (two) times daily. x2 more days     cloNIDine 0.2 MG tablet  Commonly known as:  CATAPRES  Take 0.2 mg by mouth 3 (three) times daily.     nadolol 40 MG tablet  Commonly known as:  CORGARD  Take 1 tablet (40 mg total) by mouth daily.     pantoprazole 40 MG tablet  Commonly known as:  PROTONIX  Take 1 tablet (40 mg total) by mouth daily.     promethazine 12.5 MG tablet  Commonly known as:  PHENERGAN  Take 1 tablet (12.5 mg total) by mouth every 6 (six) hours as needed for nausea or vomiting.         Brief H and P: For complete details please refer to admission H and P, but in brief, Patient is a 63 year old male with history of hypertension, osteoarthritis, chronic hepatitis C, presented to ED with diarrhea with bloody stools and vomiting blood today. Marland Kitchen History was obtained from the patient who reported that he had dark tarry stools, at least 5 episodes since yesterday, vomiting bright redblood with clots 3 times. Patient reported  that he has a history of GI bleeding in May and was found to have esophageal tear. Patient felt pale, clammy and weak and fell a couple of times with the right arm bruising from the fall this morning. Patient was transported via EMS, found to be hypotensive with systolic blood pressure in 80s, hemoglobin of 6.1, BUN 49, creatinine 1.02. Patient reported no blood thinners or any NSAID use. He did complain of abdominal pain diffusely. Drinks daily, last drink was 3 days ago.   Hospital Course:  Upper GI bleed/acute blood loss anemia with hypovolemic shock The patient's hemoglobin was 6.1 at the time of admission. He received a total of 4 units packed RBC transfusion and 2 units of FFP. He was placed on IV Protonix drip and octreotide drip. GI was consulted and patient underwent endoscopy on 11/8. EGD showed normal vaginal mucosa, small esophageal varices without stigmata of bleeding, most likely source of recent bleeding, no abnormalities in duodenal mucosa. Patient was started on nadolol which will need to be titrated up outpatient. Patient was recommended to follow up with his hepatologist, Dr. Patsy Baltimore at [facility]. Hemoglobin 9.7 at the time of discharge.   Hyponatremialikely due to hypovolemia - resolved  Hypertension - Restarted clonidine, discontinued atenolol. Lisinopril as placed on hold until more stable. Placed on nadolol.   Cirrhosis With Portal hypertension,  chronic hepatitis C Patient was placed on IV Rocephin, for SBP prophylaxis, GI recommended continuing antibiotics for 5 days, transitioned to oral antibiotics at DC.   Day of Discharge BP 98/58 mmHg  Pulse 72  Temp(Src) 97.9 F (36.6 C) (Oral)  Resp 18  Ht 5\' 11"  (1.803 m)  Wt 87.3 kg (192 lb 7.4 oz)  BMI 26.85 kg/m2  SpO2 98%  Physical Exam: General: Alert and awake oriented x3 not in any acute distress. CVS: S1-S2 clear no murmur rubs or gallops Chest: clear to auscultation bilaterally, no wheezing rales or  rhonchi Abdomen: soft nontender, nondistended, normal bowel sounds Extremities: no cyanosis, clubbing or edema noted bilaterally Neuro: Cranial nerves II-XII intact, no focal neurological deficits   The results of significant diagnostics from this hospitalization (including imaging, microbiology, ancillary and laboratory) are listed below for reference.    LAB RESULTS: Basic Metabolic Panel:  Recent Labs Lab 09/15/14 0250 09/16/14 0337  NA 136* 135*  K 4.3 4.0  CL 105 102  CO2 16* 19  GLUCOSE 123* 106*  BUN 34* 24*  CREATININE 0.99 1.00  CALCIUM 8.3* 7.9*   Liver Function Tests:  Recent Labs Lab 09/13/14 0756  AST 60*  ALT 28  ALKPHOS 75  BILITOT 0.6  PROT 5.7*  ALBUMIN 2.9*   No results for input(s): LIPASE, AMYLASE in the last 168 hours. No results for input(s): AMMONIA in the last 168 hours. CBC:  Recent Labs Lab 09/13/14 0756  09/15/14 0714 09/15/14 0805  WBC 12.4*  --   --  10.1  HGB 6.1*  < > 8.9* 9.1*  HCT 18.2*  < > 25.6* 25.6*  MCV 94.3  --   --  84.5  PLT 223  --   --  PLATELET CLUMPS NOTED ON SMEAR, UNABLE TO ESTIMATE  < > = values in this interval not displayed. Cardiac Enzymes: No results for input(s): CKTOTAL, CKMB, CKMBINDEX, TROPONINI in the last 168 hours. BNP: Invalid input(s): POCBNP CBG: No results for input(s): GLUCAP in the last 168 hours.  Significant Diagnostic Studies:  No results found.     Disposition and Follow-up: Discharge Instructions    Diet - low sodium heart healthy    Complete by:  As directed      Discharge instructions    Complete by:  As directed   Please note that atenolol has been discontinued. Please HOLD lisinopril and follow-up with your PCP, check your BP at home. If your BP is running above SBP >130 and DBP >90, you can restart lisinopril at half dose (half tablet).     Increase activity slowly    Complete by:  As directed             DISPOSITION: home   DIET heart healthy   DISCHARGE  FOLLOW-UP Follow-up Information    Follow up with ZACKS,STEVEN L, MD. Schedule an appointment as soon as possible for a visit in 2 weeks.   Specialty:  Gastroenterology   Why:  for hospital follow-up   Contact information:   [address] [facility] [id number] [place] [address] [phone]       Follow up with Gerrit Heck, MD. Schedule an appointment as soon as possible for a visit in 2 weeks.   Specialty:  Family Medicine   Why:  for hospital follow-up   Contact information:   [place] [address] [phone]       Time spent on Discharge: 33 mins  Signed:  RAI,RIPUDEEP M.D. [facility] 09/16/2014, 9:21 AM Pager: [phone]

## 2014-09-17 LAB — TYPE AND SCREEN
ABO/RH(D): B POS
ANTIBODY SCREEN: POSITIVE
DAT, IGG: NEGATIVE
DONOR AG TYPE: NEGATIVE
Donor AG Type: NEGATIVE
Donor AG Type: NEGATIVE
Donor AG Type: NEGATIVE
Donor AG Type: NEGATIVE
PT AG Type: NEGATIVE
UNIT DIVISION: 0
UNIT DIVISION: 0
UNIT DIVISION: 0
Unit division: 0
Unit division: 0

## 2014-09-24 ENCOUNTER — Encounter (HOSPITAL_COMMUNITY): Payer: Self-pay | Admitting: Emergency Medicine

## 2014-09-24 ENCOUNTER — Emergency Department (HOSPITAL_COMMUNITY)
Admission: EM | Admit: 2014-09-24 | Discharge: 2014-09-24 | Disposition: A | Discharge status: Home / Self Care | Payer: Medicare HMO | Attending: Emergency Medicine | Admitting: Emergency Medicine

## 2014-09-24 DIAGNOSIS — Z8739 Personal history of other diseases of the musculoskeletal system and connective tissue: Secondary | ICD-10-CM | POA: Diagnosis not present

## 2014-09-24 DIAGNOSIS — Z79899 Other long term (current) drug therapy: Secondary | ICD-10-CM | POA: Insufficient documentation

## 2014-09-24 DIAGNOSIS — I10 Essential (primary) hypertension: Secondary | ICD-10-CM | POA: Diagnosis not present

## 2014-09-24 DIAGNOSIS — Z8619 Personal history of other infectious and parasitic diseases: Secondary | ICD-10-CM | POA: Insufficient documentation

## 2014-09-24 DIAGNOSIS — Z792 Long term (current) use of antibiotics: Secondary | ICD-10-CM | POA: Diagnosis not present

## 2014-09-24 DIAGNOSIS — D649 Anemia, unspecified: Secondary | ICD-10-CM

## 2014-09-24 LAB — COMPREHENSIVE METABOLIC PANEL
ALK PHOS: 173 U/L — AB (ref 39–117)
ALT: 32 U/L (ref 0–53)
ANION GAP: 12 (ref 5–15)
AST: 66 U/L — AB (ref 0–37)
Albumin: 3.2 g/dL — ABNORMAL LOW (ref 3.5–5.2)
BILIRUBIN TOTAL: 0.5 mg/dL (ref 0.3–1.2)
BUN: 15 mg/dL (ref 6–23)
CO2: 19 meq/L (ref 19–32)
CREATININE: 1.12 mg/dL (ref 0.50–1.35)
Calcium: 8.4 mg/dL (ref 8.4–10.5)
Chloride: 100 mEq/L (ref 96–112)
GFR calc Af Amer: 80 mL/min — ABNORMAL LOW (ref >90)
GFR calc non Af Amer: 69 mL/min — ABNORMAL LOW (ref >90)
Glucose, Bld: 105 mg/dL — ABNORMAL HIGH (ref 70–99)
POTASSIUM: 4.8 meq/L (ref 3.7–5.3)
Sodium: 131 mEq/L — ABNORMAL LOW (ref 137–147)
Total Protein: 6.7 g/dL (ref 6.0–8.3)

## 2014-09-24 LAB — CBC WITH DIFFERENTIAL/PLATELET
Basophils Absolute: 0 10*3/uL (ref 0.0–0.1)
Basophils Relative: 1 % (ref 0–1)
Eosinophils Absolute: 0.3 10*3/uL (ref 0.0–0.7)
Eosinophils Relative: 4 % (ref 0–5)
HEMATOCRIT: 24.8 % — AB (ref 39.0–52.0)
HEMOGLOBIN: 8.7 g/dL — AB (ref 13.0–17.0)
Lymphocytes Relative: 31 % (ref 12–46)
Lymphs Abs: 2 10*3/uL (ref 0.7–4.0)
MCH: 30.2 pg (ref 26.0–34.0)
MCHC: 35.1 g/dL (ref 30.0–36.0)
MCV: 86.1 fL (ref 78.0–100.0)
MONO ABS: 0.8 10*3/uL (ref 0.1–1.0)
MONOS PCT: 12 % (ref 3–12)
Neutro Abs: 3.4 10*3/uL (ref 1.7–7.7)
Neutrophils Relative %: 52 % (ref 43–77)
Platelets: 242 10*3/uL (ref 150–400)
RBC: 2.88 MIL/uL — AB (ref 4.22–5.81)
RDW: 17.3 % — ABNORMAL HIGH (ref 11.5–15.5)
WBC: 6.5 10*3/uL (ref 4.0–10.5)

## 2014-09-24 LAB — SAMPLE TO BLOOD BANK

## 2014-09-24 LAB — POC OCCULT BLOOD, ED: Fecal Occult Bld: NEGATIVE

## 2014-09-24 LAB — PROTIME-INR
INR: 1.13 (ref 0.00–1.49)
Prothrombin Time: 14.6 seconds (ref 11.6–15.2)

## 2014-09-24 LAB — APTT: APTT: 33 s (ref 24–37)

## 2014-09-24 NOTE — Discharge Instructions (Signed)
I spoke to Dr Garth Schlatter partner. He wants you to call your liver specialist to have them recheck you soon. You need to talk to your primary care doctor about tapering you off your blood pressure medication, catapres. You cannot just stop that blood pressure medication. You can start by dropping it down to twice a day from 3 times a day. Return to the ED if you start vomiting blood, see blood in your BM's, have black stools, feel weak, dizzy or have abdominal pain.

## 2014-09-24 NOTE — ED Notes (Signed)
Pt states that his PCP sent him here due to lower Hgb than when his lab value was when he was recently in [facility] for GI bleed.
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
## 2014-11-13 DIAGNOSIS — B182 Chronic viral hepatitis C: Secondary | ICD-10-CM | POA: Diagnosis not present

## 2014-11-13 DIAGNOSIS — K7030 Alcoholic cirrhosis of liver without ascites: Secondary | ICD-10-CM | POA: Diagnosis not present

## 2014-12-05 ENCOUNTER — Other Ambulatory Visit: Payer: Self-pay | Admitting: Gastroenterology

## 2014-12-05 DIAGNOSIS — B182 Chronic viral hepatitis C: Secondary | ICD-10-CM

## 2014-12-12 ENCOUNTER — Ambulatory Visit
Admission: RE | Admit: 2014-12-12 | Discharge: 2014-12-12 | Disposition: A | Discharge status: Home / Self Care | Payer: Commercial Managed Care - HMO | Source: Ambulatory Visit | Attending: Gastroenterology | Admitting: Gastroenterology

## 2014-12-12 DIAGNOSIS — B182 Chronic viral hepatitis C: Secondary | ICD-10-CM

## 2014-12-12 DIAGNOSIS — B1920 Unspecified viral hepatitis C without hepatic coma: Secondary | ICD-10-CM | POA: Diagnosis not present

## 2014-12-12 DIAGNOSIS — I77811 Abdominal aortic ectasia: Secondary | ICD-10-CM | POA: Diagnosis not present

## 2014-12-12 DIAGNOSIS — K7460 Unspecified cirrhosis of liver: Secondary | ICD-10-CM | POA: Diagnosis not present

## 2015-01-22 DIAGNOSIS — K7030 Alcoholic cirrhosis of liver without ascites: Secondary | ICD-10-CM | POA: Diagnosis not present

## 2015-01-22 DIAGNOSIS — B182 Chronic viral hepatitis C: Secondary | ICD-10-CM | POA: Diagnosis not present

## 2015-04-16 ENCOUNTER — Other Ambulatory Visit: Payer: Self-pay | Admitting: Gastroenterology

## 2015-04-16 DIAGNOSIS — K7030 Alcoholic cirrhosis of liver without ascites: Secondary | ICD-10-CM | POA: Diagnosis not present

## 2015-04-16 DIAGNOSIS — B182 Chronic viral hepatitis C: Secondary | ICD-10-CM

## 2015-04-16 DIAGNOSIS — K7460 Unspecified cirrhosis of liver: Secondary | ICD-10-CM | POA: Diagnosis not present

## 2015-04-21 DIAGNOSIS — S82234F Nondisplaced oblique fracture of shaft of right tibia, subsequent encounter for open fracture type IIIA, IIIB, or IIIC with routine healing: Secondary | ICD-10-CM | POA: Diagnosis not present

## 2015-04-21 DIAGNOSIS — Z9889 Other specified postprocedural states: Secondary | ICD-10-CM | POA: Diagnosis not present

## 2015-04-21 DIAGNOSIS — M25562 Pain in left knee: Secondary | ICD-10-CM | POA: Diagnosis not present

## 2015-04-21 DIAGNOSIS — N179 Acute kidney failure, unspecified: Secondary | ICD-10-CM | POA: Diagnosis not present

## 2015-04-21 DIAGNOSIS — S8291XF Unspecified fracture of right lower leg, subsequent encounter for open fracture type IIIA, IIIB, or IIIC with routine healing: Secondary | ICD-10-CM | POA: Diagnosis not present

## 2015-04-21 DIAGNOSIS — I129 Hypertensive chronic kidney disease with stage 1 through stage 4 chronic kidney disease, or unspecified chronic kidney disease: Secondary | ICD-10-CM | POA: Diagnosis not present

## 2015-04-21 DIAGNOSIS — Z79899 Other long term (current) drug therapy: Secondary | ICD-10-CM | POA: Diagnosis not present

## 2015-04-22 ENCOUNTER — Ambulatory Visit
Admission: RE | Admit: 2015-04-22 | Discharge: 2015-04-22 | Disposition: A | Discharge status: Home / Self Care | Payer: Commercial Managed Care - HMO | Source: Ambulatory Visit | Attending: Gastroenterology | Admitting: Gastroenterology

## 2015-04-22 DIAGNOSIS — K7460 Unspecified cirrhosis of liver: Secondary | ICD-10-CM | POA: Diagnosis not present

## 2015-04-22 DIAGNOSIS — B182 Chronic viral hepatitis C: Secondary | ICD-10-CM | POA: Diagnosis not present

## 2015-04-22 DIAGNOSIS — Z9049 Acquired absence of other specified parts of digestive tract: Secondary | ICD-10-CM | POA: Diagnosis not present

## 2015-05-07 DIAGNOSIS — B1920 Unspecified viral hepatitis C without hepatic coma: Secondary | ICD-10-CM | POA: Diagnosis not present

## 2015-05-07 DIAGNOSIS — M25552 Pain in left hip: Secondary | ICD-10-CM | POA: Diagnosis not present

## 2015-05-07 DIAGNOSIS — M25562 Pain in left knee: Secondary | ICD-10-CM | POA: Diagnosis not present

## 2015-05-07 DIAGNOSIS — I10 Essential (primary) hypertension: Secondary | ICD-10-CM | POA: Diagnosis not present

## 2015-05-15 NOTE — Patient Outreach (Signed)
[facility]) Care Management  05/15/2015  Joshua Burton 12-22-1950 [id number]   Referral from [facility] Tier 4 list and assigned to Quinn Plowman, RN for patient outreach.  [place] [facility] Care Management Assistant [phone]

## 2015-05-20 ENCOUNTER — Other Ambulatory Visit: Payer: Self-pay

## 2015-05-20 NOTE — Patient Outreach (Signed)
Joshua Burton) Care Management  05/20/2015  BRYLON BRENNING July 04, 1951 [id number]    Telephone call to patient regarding [place] referral.  HIPAA confirmed with patient.  Discussed and offered [facility] care management services. Patient refused services.  Patient requested contact information to be sent.  PLAN: RNCM will refer to Lurline Del to close due to refusal of services. RNCM will notify patients primary MD of refusal of services.  RNCM will send patient outreach letter and pamphlet as requested.   Quinn Plowman RN,BSN,CCM [place] Coordinator [phone]

## 2015-05-25 NOTE — Patient Outreach (Signed)
[facility] [facility]) Care Management  05/25/2015  Joshua Burton Jun 11, 1951 [id number]   Request from Quinn Plowman, RN to close case due to patient refused to participate with [place] Management services.  [name]. [place], [place] Management [place] Assistant Phone: [phone] Fax: [phone]

## 2015-05-28 DIAGNOSIS — M7062 Trochanteric bursitis, left hip: Secondary | ICD-10-CM | POA: Diagnosis not present

## 2015-05-28 DIAGNOSIS — M1712 Unilateral primary osteoarthritis, left knee: Secondary | ICD-10-CM | POA: Diagnosis not present

## 2015-06-10 DIAGNOSIS — B1920 Unspecified viral hepatitis C without hepatic coma: Secondary | ICD-10-CM | POA: Diagnosis not present

## 2015-06-10 DIAGNOSIS — Z79899 Other long term (current) drug therapy: Secondary | ICD-10-CM | POA: Diagnosis not present

## 2015-06-10 DIAGNOSIS — I10 Essential (primary) hypertension: Secondary | ICD-10-CM | POA: Diagnosis not present

## 2015-06-24 DIAGNOSIS — E871 Hypo-osmolality and hyponatremia: Secondary | ICD-10-CM | POA: Diagnosis not present

## 2015-07-18 ENCOUNTER — Emergency Department (HOSPITAL_COMMUNITY)
Admission: EM | Admit: 2015-07-18 | Discharge: 2015-07-18 | Disposition: A | Discharge status: Home / Self Care | Payer: Medicare HMO | Source: Home / Self Care | Attending: Family Medicine | Admitting: Family Medicine

## 2015-07-18 ENCOUNTER — Encounter (HOSPITAL_COMMUNITY): Payer: Self-pay | Admitting: Emergency Medicine

## 2015-07-18 DIAGNOSIS — L03113 Cellulitis of right upper limb: Secondary | ICD-10-CM

## 2015-07-18 MED ORDER — LIDOCAINE HCL (PF) 1 % IJ SOLN
INTRAMUSCULAR | Status: AC
  Filled 2015-07-18: qty 5

## 2015-07-18 MED ORDER — CEFTRIAXONE SODIUM 1 G IJ SOLR
INTRAMUSCULAR | Status: AC
  Filled 2015-07-18: qty 10

## 2015-07-18 MED ORDER — CEPHALEXIN 500 MG PO CAPS: 500.0000 mg | ORAL_CAPSULE | Freq: Four times a day (QID) | ORAL | Status: DC

## 2015-07-18 MED ORDER — CEFTRIAXONE SODIUM 1 G IJ SOLR
1.0000 g | Freq: Once | INTRAMUSCULAR | Status: AC
  Administered 2015-07-18: 1 g via INTRAMUSCULAR

## 2015-07-18 NOTE — ED Notes (Signed)
C/o cellulitis to right elbow onset 3 days Sx include redness, swelling and Sore; 7/10 Denies inj/trauma Alert and oriented x4... No acute distress.

## 2015-07-18 NOTE — ED Provider Notes (Signed)
CSN: [id number]     Arrival date & time 07/18/15  1700 History   First MD Initiated Contact with Patient 07/18/15 1747     Chief Complaint  Patient presents with  . Cellulitis   (Consider location/radiation/quality/duration/timing/severity/associated sxs/prior Treatment) Patient is a 64 y.o. male presenting with rash.  Rash Location:  Shoulder/arm Shoulder/arm rash location:  R elbow Quality: painful, redness and swelling   Pain details:    Quality:  Sore   Severity:  Moderate   Onset quality:  Gradual   Duration:  4 days   Progression:  Worsening Severity:  Moderate Onset quality:  Gradual Chronicity:  New Context: insect bite/sting   Relieved by:  None tried Worsened by:  Nothing tried Associated symptoms: joint pain   Associated symptoms: no fever     Past Medical History  Diagnosis Date  . Hypertension   . Osteoarthritis   . Blood transfusion without reported diagnosis     during hip replacement 2012  . Hepatitis C    Past Surgical History  Procedure Laterality Date  . Hip surgery  2012    right  . Laparoscopic cholecystectomy  2008  . Fracture surgery  1975    tib/fib// right  . Leg infection  2014  . External fixation leg Right 02/25/2014    Procedure: EXTERNAL FIXATION LEG;  Surgeon: Marianna Payment, MD;  Location: [place];  Service: Orthopedics;  Laterality: Right;  . Esophagogastroduodenoscopy N/A 09/14/2014    Procedure: ESOPHAGOGASTRODUODENOSCOPY (EGD);  Surgeon: Jerene Bears, MD;  Location: [facility] ENDOSCOPY;  Service: Endoscopy;  Laterality: N/A;   Family History  Problem Relation Age of Onset  . Colon cancer Neg Hx   . Arthritis Mother   . Hypertension Father   . Arthritis Brother   . Hypertension Brother    Social History  Substance Use Topics  . Smoking status: Never Smoker   . Smokeless tobacco: Never Used  . Alcohol Use: 4.2 oz/week    7 Cans of beer per week    Review of Systems  Constitutional: Negative.  Negative for fever.    Musculoskeletal: Positive for joint swelling and arthralgias.  Skin: Positive for rash.    Allergies  Review of patient's allergies indicates no known allergies.  Home Medications   Prior to Admission medications   Medication Sig Start Date End Date Taking? Authorizing Provider  cloNIDine (CATAPRES) 0.2 MG tablet Take 0.2 mg by mouth 3 (three) times daily. 01/03/14  Yes Jearld Fenton, NP  meloxicam (MOBIC) 7.5 MG tablet Take 7.5 mg by mouth daily.   Yes Historical Provider, MD  pantoprazole (PROTONIX) 40 MG tablet Take 1 tablet (40 mg total) by mouth daily. 09/16/14  Yes Ripudeep Krystal Eaton, MD  spironolactone (ALDACTONE) 100 MG tablet Take 100 mg by mouth daily.   Yes Historical Provider, MD  cephALEXin (KEFLEX) 500 MG capsule Take 1 capsule (500 mg total) by mouth 4 (four) times daily. Take all of medicine and drink lots of fluids 07/18/15   Billy Fischer, MD  ciprofloxacin (CIPRO) 500 MG tablet Take 1 tablet (500 mg total) by mouth 2 (two) times daily. x2 more days Patient not taking: Reported on 09/24/2014 09/16/14   Ripudeep Krystal Eaton, MD  nadolol (CORGARD) 40 MG tablet Take 1 tablet (40 mg total) by mouth daily. 09/16/14   Ripudeep Krystal Eaton, MD  promethazine (PHENERGAN) 12.5 MG tablet Take 1 tablet (12.5 mg total) by mouth every 6 (six) hours as needed for nausea or vomiting.  Patient not taking: Reported on 09/24/2014 09/16/14   Ripudeep Krystal Eaton, MD   Meds Ordered and Administered this Visit   Medications  cefTRIAXone (ROCEPHIN) injection 1 g (1 g Intramuscular Given 07/18/15 1842)    BP 166/90 mmHg  Pulse 98  Temp(Src) 98.6 F (37 C) (Oral)  Resp 18  SpO2 96% No data found.   Physical Exam  Constitutional: He is oriented to person, place, and time. He appears well-developed and well-nourished. No distress.  Musculoskeletal: He exhibits tenderness.       Arms: Neurological: He is alert and oriented to person, place, and time.  Skin: Skin is warm and dry. There is erythema.  Nursing  note and vitals reviewed.   ED Course  Procedures (including critical care time)  Labs Review Labs Reviewed  BODY FLUID CULTURE  GRAM STAIN    Imaging Review No results found.   Visual Acuity Review  Right Eye Distance:   Left Eye Distance:   Bilateral Distance:    Right Eye Near:   Left Eye Near:    Bilateral Near:         MDM   1. Cellulitis of right upper extremity    1cc purulent fluid aspir from olecranon. Sent for cult and gm stain.     Billy Fischer, MD 07/18/15 [phone]

## 2015-07-18 NOTE — Discharge Instructions (Signed)
Warm cloth soak to elbow before antibiotic, see dr Georgina Snell on mon for recheck, call for appt., start antibiotic pill on sun.

## 2015-07-20 ENCOUNTER — Encounter: Payer: Self-pay | Admitting: Family Medicine

## 2015-07-20 ENCOUNTER — Ambulatory Visit (INDEPENDENT_AMBULATORY_CARE_PROVIDER_SITE_OTHER): Payer: Medicare HMO | Admitting: Family Medicine

## 2015-07-20 VITALS — BP 159/97 | HR 107 | Ht 71.0 in | Wt 198.0 lb

## 2015-07-20 DIAGNOSIS — M71121 Other infective bursitis, right elbow: Secondary | ICD-10-CM | POA: Insufficient documentation

## 2015-07-20 DIAGNOSIS — B9689 Other specified bacterial agents as the cause of diseases classified elsewhere: Secondary | ICD-10-CM

## 2015-07-20 DIAGNOSIS — M7021 Olecranon bursitis, right elbow: Secondary | ICD-10-CM | POA: Diagnosis not present

## 2015-07-20 MED ORDER — DOXYCYCLINE HYCLATE 100 MG PO TABS: 100.0000 mg | ORAL_TABLET | Freq: Two times a day (BID) | ORAL | Status: DC

## 2015-07-20 NOTE — Assessment & Plan Note (Signed)
Patient likely has bacteria resistant to Keflex. Awaiting sensitivity from culture. New culture pending as well. Fluid drained today. We will add doxycycline. Return in a few days. Return sooner if not better. Patient's aware that if we are not able to treat this infection with oral medications and percutaneous drainage he may require hospitalization for surgery and IV antibiotics

## 2015-07-20 NOTE — Progress Notes (Signed)
   Subjective:    I'm seeing this patient as a consultation for:  Dr. Juventino Slovak.  CC: Right elbow cellulitis  HPI: Patient was seen at [facility] urgent care 2 days ago for what was thought to be right septic olecranon bursitis and elbow cellulitis. Aspiration of the bursa was obtained and sent for culture which is now growing staph aureus. He was treated with ceftriaxone and Keflex. He states that his elbow is feeling slightly better. He notes the cellulitis has improved but the tip of the elbow is still tender and painful.  Past medical history, Surgical history, Family history not pertinant except as noted below, Social history, Allergies, and medications have been entered into the medical record, reviewed, and no changes needed.   Review of Systems: No headache, visual changes, nausea, vomiting, diarrhea, constipation, dizziness, abdominal pain, skin rash, fevers, chills, night sweats, weight loss, swollen lymph nodes, body aches, joint swelling, muscle aches, chest pain, shortness of breath, mood changes, visual or auditory hallucinations.   Objective:    Filed Vitals:   07/20/15 1630  BP: 159/97  Pulse: 107   afebrile General: Well Developed, well nourished, and in no acute distress.  Neuro/Psych: Alert and oriented x3, extra-ocular muscles intact, able to move all 4 extremities, sensation grossly intact. Skin: Warm and dry, no rashes noted.  Respiratory: Not using accessory muscles, speaking in full sentences, trachea midline.  Cardiovascular: Pulses palpable, no extremity edema. Abdomen: Does not appear distended. MSK: Right elbow minimal erythema surrounding the forearm. Focal fluctuance and tenderness overlying the olecranon. Pulses capillary refill sensation intact distally. Normal elbow motion.  Aspiration of the olecranon bursa: Consent obtained and timeout performed. Skin cleaned with alcohol and cold spray applied. 18-gauge needle was used to access the olecranon bursa. 5 mL  of cloudy fluid were removed and sent for culture.  Labs reviewed  No results found for this or any previous visit (from the past 24 hour(s)). No results found.  Impression and Recommendations:   This case required medical decision making of moderate complexity.

## 2015-07-20 NOTE — Patient Instructions (Signed)
Thank you for coming in today. Take both the keflex and doxycycline.  Return in a few days.  Come back or call sooner if not better.  Call or go to the emergency room if you get worse, have trouble breathing, have chest pains, or palpitations.   Cellulitis Cellulitis is an infection of the skin and the tissue beneath it. The infected area is usually red and tender. Cellulitis occurs most often in the arms and lower legs.  CAUSES  Cellulitis is caused by bacteria that enter the skin through cracks or cuts in the skin. The most common types of bacteria that cause cellulitis are staphylococci and streptococci. SIGNS AND SYMPTOMS   Redness and warmth.  Swelling.  Tenderness or pain.  Fever. DIAGNOSIS  Your health care provider can usually determine what is wrong based on a physical exam. Blood tests may also be done. TREATMENT  Treatment usually involves taking an antibiotic medicine. HOME CARE INSTRUCTIONS   Take your antibiotic medicine as directed by your health care provider. Finish the antibiotic even if you start to feel better.  Keep the infected arm or leg elevated to reduce swelling.  Apply a warm cloth to the affected area up to 4 times per day to relieve pain.  Take medicines only as directed by your health care provider.  Keep all follow-up visits as directed by your health care provider. SEEK MEDICAL CARE IF:   You notice red streaks coming from the infected area.  Your red area gets larger or turns dark in color.  Your bone or joint underneath the infected area becomes painful after the skin has healed.  Your infection returns in the same area or another area.  You notice a swollen bump in the infected area.  You develop new symptoms.  You have a fever. SEEK IMMEDIATE MEDICAL CARE IF:   You feel very sleepy.  You develop vomiting or diarrhea.  You have a general ill feeling (malaise) with muscle aches and pains. MAKE SURE YOU:   Understand these  instructions.  Will watch your condition.  Will get help right away if you are not doing well or get worse. Document Released: 08/03/2005 Document Revised: 03/10/2014 Document Reviewed: 01/09/2012 [facility] Patient Information 2015 [place], [place]. This information is not intended to replace advice given to you by your health care provider. Make sure you discuss any questions you have with your health care provider.

## 2015-07-21 NOTE — ED Notes (Signed)
Final report of fluid C&S reviewed w Dr Maryruth Eve, who agrees the Rx provided day of visit will cover identified organism

## 2015-07-22 LAB — BODY FLUID CULTURE

## 2015-07-23 ENCOUNTER — Ambulatory Visit: Payer: Medicare HMO | Admitting: Family Medicine

## 2015-07-23 LAB — BODY FLUID CULTURE: Gram Stain: NONE SEEN

## 2015-07-24 NOTE — Progress Notes (Signed)
Quick Note:  Culture shows staph sensitive to the antibiotics. Shojuld be getting better. ______

## 2015-07-27 ENCOUNTER — Encounter: Payer: Self-pay | Admitting: Family Medicine

## 2015-07-27 ENCOUNTER — Ambulatory Visit (INDEPENDENT_AMBULATORY_CARE_PROVIDER_SITE_OTHER): Payer: Medicare HMO | Admitting: Family Medicine

## 2015-07-27 VITALS — BP 166/87 | HR 93 | Temp 98.6°F | Wt 199.0 lb

## 2015-07-27 DIAGNOSIS — M71121 Other infective bursitis, right elbow: Secondary | ICD-10-CM

## 2015-07-27 DIAGNOSIS — M7021 Olecranon bursitis, right elbow: Secondary | ICD-10-CM

## 2015-07-27 DIAGNOSIS — B9689 Other specified bacterial agents as the cause of diseases classified elsewhere: Secondary | ICD-10-CM

## 2015-07-27 NOTE — Assessment & Plan Note (Signed)
Patient has failed conservative treatment. Discussed case with Dr. Fredonia Highland. He will follow-up with Dr. Percell Miller on Wednesday at 8:30 AM in his office. Patient will likely require washout in the operating room. The interim patient will call if he has any issues.

## 2015-07-27 NOTE — Progress Notes (Signed)
Joshua Burton is a 64 y.o. male who presents to [facility]: [facility]  today for follow up left elbow infection.  Patient return to clinic today to follow up his right septic olecranon bursitis. He was first diagnosed in urgent care on September 10 with an aspiration and culture growing MSSA. He was placed on emperic keflex. He followed up with me on September 12. At that time his surrounding cellulitis was improving however his bursitis was not improving. He was again aspirated and cultrured. At that time culture results from the first apiration were not back fully. His antibiotics were broadned to include doxycycline. In the interim he notes that the cellulitis has completely resolved however he continues to have a painful red swollen mass at his olecranon. He denies any fevers chills nausea vomiting or diarrhea. He continues taking doxycycline.   Past Medical History  Diagnosis Date  . Hypertension   . Osteoarthritis   . Blood transfusion without reported diagnosis     during hip replacement 2012  . Hepatitis C    Past Surgical History  Procedure Laterality Date  . Hip surgery  2012    right  . Laparoscopic cholecystectomy  2008  . Fracture surgery  1975    tib/fib// right  . Leg infection  2014  . External fixation leg Right 02/25/2014    Procedure: EXTERNAL FIXATION LEG;  Surgeon: Marianna Payment, MD;  Location: [facility];  Service: Orthopedics;  Laterality: Right;  . Esophagogastroduodenoscopy N/A 09/14/2014    Procedure: ESOPHAGOGASTRODUODENOSCOPY (EGD);  Surgeon: Jerene Bears, MD;  Location[facility] ENDOSCOPY;  Service: Endoscopy;  Laterality: N/A;   Social History  Substance Use Topics  . Smoking status: Never Smoker   . Smokeless tobacco: Never Used  . Alcohol Use: 4.2 oz/week    7 Cans of beer per week   family history includes Arthritis in his brother and mother; Hypertension in his brother and father. There is no history of Colon cancer.  ROS as  above Medications: Current Outpatient Prescriptions  Medication Sig Dispense Refill  . cloNIDine (CATAPRES) 0.2 MG tablet Take 0.2 mg by mouth 3 (three) times daily.    Marland Kitchen doxycycline (VIBRA-TABS) 100 MG tablet Take 1 tablet (100 mg total) by mouth 2 (two) times daily. 20 tablet 0  . meloxicam (MOBIC) 7.5 MG tablet Take 7.5 mg by mouth daily.    . pantoprazole (PROTONIX) 40 MG tablet Take 1 tablet (40 mg total) by mouth daily. 30 tablet 5  . promethazine (PHENERGAN) 12.5 MG tablet Take 1 tablet (12.5 mg total) by mouth every 6 (six) hours as needed for nausea or vomiting. 30 tablet 0  . spironolactone (ALDACTONE) 100 MG tablet Take 100 mg by mouth daily.    . nadolol (CORGARD) 40 MG tablet Take 1 tablet (40 mg total) by mouth daily. (Patient not taking: Reported on 07/20/2015) 30 tablet 5   No current facility-administered medications for this visit.   No Known Allergies   Exam:  BP 166/87 mmHg  Pulse 93  Temp(Src) 98.6 F (37 C) (Oral)  Wt 199 lb (90.266 kg)  SpO2 95% Gen: Well NAD Skin: Right elbow no cellulitis. Swollen fluctuant erythematous olecranon bursa. Tender to touch.   Review of labs showing MSSA  No results found for this or any previous visit (from the past 24 hour(s)). No results found.   Please see individual assessment and plan sections.

## 2015-07-27 NOTE — Patient Instructions (Signed)
Thank you for coming in today. Follow up with Dr. Fredonia Highland at 66 am Wednesday.   Call if you get worse.   Address: [address], [place], [place] [address] Hours: 8:30AM-5PM Phone: [phone]   Say that you were seen and he asked to see you.

## 2015-07-29 DIAGNOSIS — M7031 Other bursitis of elbow, right elbow: Secondary | ICD-10-CM | POA: Diagnosis not present

## 2015-07-29 DIAGNOSIS — M25521 Pain in right elbow: Secondary | ICD-10-CM | POA: Diagnosis not present

## 2015-07-29 DIAGNOSIS — M7021 Olecranon bursitis, right elbow: Secondary | ICD-10-CM | POA: Diagnosis not present

## 2015-07-29 DIAGNOSIS — M25421 Effusion, right elbow: Secondary | ICD-10-CM | POA: Diagnosis not present

## 2015-08-03 DIAGNOSIS — M7021 Olecranon bursitis, right elbow: Secondary | ICD-10-CM | POA: Diagnosis not present

## 2015-08-10 DIAGNOSIS — M7021 Olecranon bursitis, right elbow: Secondary | ICD-10-CM | POA: Diagnosis not present

## 2015-09-29 DIAGNOSIS — Z0000 Encounter for general adult medical examination without abnormal findings: Secondary | ICD-10-CM | POA: Diagnosis not present

## 2015-09-29 DIAGNOSIS — M1991 Primary osteoarthritis, unspecified site: Secondary | ICD-10-CM | POA: Diagnosis not present

## 2015-09-29 DIAGNOSIS — I10 Essential (primary) hypertension: Secondary | ICD-10-CM | POA: Diagnosis not present

## 2015-09-29 DIAGNOSIS — Z6826 Body mass index (BMI) 26.0-26.9, adult: Secondary | ICD-10-CM | POA: Diagnosis not present

## 2015-09-29 DIAGNOSIS — K219 Gastro-esophageal reflux disease without esophagitis: Secondary | ICD-10-CM | POA: Diagnosis not present

## 2015-10-29 ENCOUNTER — Other Ambulatory Visit: Payer: Self-pay | Admitting: Gastroenterology

## 2015-10-29 DIAGNOSIS — K745 Biliary cirrhosis, unspecified: Secondary | ICD-10-CM

## 2015-10-29 DIAGNOSIS — B182 Chronic viral hepatitis C: Secondary | ICD-10-CM | POA: Diagnosis not present

## 2015-10-29 DIAGNOSIS — K7030 Alcoholic cirrhosis of liver without ascites: Secondary | ICD-10-CM | POA: Diagnosis not present

## 2015-10-29 DIAGNOSIS — K766 Portal hypertension: Secondary | ICD-10-CM | POA: Diagnosis not present

## 2015-10-29 DIAGNOSIS — Z8719 Personal history of other diseases of the digestive system: Secondary | ICD-10-CM

## 2015-10-30 DIAGNOSIS — I10 Essential (primary) hypertension: Secondary | ICD-10-CM | POA: Diagnosis not present

## 2015-10-30 DIAGNOSIS — Z1211 Encounter for screening for malignant neoplasm of colon: Secondary | ICD-10-CM | POA: Diagnosis not present

## 2015-10-30 DIAGNOSIS — B1920 Unspecified viral hepatitis C without hepatic coma: Secondary | ICD-10-CM | POA: Diagnosis not present
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
## 2015-11-05 ENCOUNTER — Ambulatory Visit
Admission: RE | Admit: 2015-11-05 | Discharge: 2015-11-05 | Disposition: A | Discharge status: Home / Self Care | Payer: Commercial Managed Care - HMO | Source: Ambulatory Visit | Attending: Gastroenterology | Admitting: Gastroenterology

## 2015-11-05 DIAGNOSIS — Z8719 Personal history of other diseases of the digestive system: Secondary | ICD-10-CM

## 2015-11-05 DIAGNOSIS — K7460 Unspecified cirrhosis of liver: Secondary | ICD-10-CM | POA: Diagnosis not present

## 2015-11-12 DIAGNOSIS — B1920 Unspecified viral hepatitis C without hepatic coma: Secondary | ICD-10-CM | POA: Diagnosis not present

## 2015-11-12 DIAGNOSIS — I8500 Esophageal varices without bleeding: Secondary | ICD-10-CM | POA: Diagnosis not present

## 2015-11-12 DIAGNOSIS — K3189 Other diseases of stomach and duodenum: Secondary | ICD-10-CM | POA: Diagnosis not present

## 2015-11-12 DIAGNOSIS — I10 Essential (primary) hypertension: Secondary | ICD-10-CM | POA: Diagnosis not present

## 2015-11-12 DIAGNOSIS — I8501 Esophageal varices with bleeding: Secondary | ICD-10-CM | POA: Diagnosis not present

## 2015-11-12 DIAGNOSIS — I8510 Secondary esophageal varices without bleeding: Secondary | ICD-10-CM | POA: Diagnosis not present

## 2015-11-12 DIAGNOSIS — K766 Portal hypertension: Secondary | ICD-10-CM | POA: Diagnosis not present

## 2015-11-12 DIAGNOSIS — K269 Duodenal ulcer, unspecified as acute or chronic, without hemorrhage or perforation: Secondary | ICD-10-CM | POA: Diagnosis not present

## 2015-11-20 DIAGNOSIS — Z1211 Encounter for screening for malignant neoplasm of colon: Secondary | ICD-10-CM | POA: Diagnosis not present

## 2015-11-20 DIAGNOSIS — B1920 Unspecified viral hepatitis C without hepatic coma: Secondary | ICD-10-CM | POA: Diagnosis not present

## 2015-11-25 DIAGNOSIS — K640 First degree hemorrhoids: Secondary | ICD-10-CM | POA: Diagnosis not present

## 2015-11-25 DIAGNOSIS — Z1211 Encounter for screening for malignant neoplasm of colon: Secondary | ICD-10-CM | POA: Diagnosis not present

## 2015-12-24 DIAGNOSIS — B1920 Unspecified viral hepatitis C without hepatic coma: Secondary | ICD-10-CM | POA: Diagnosis not present

## 2015-12-24 DIAGNOSIS — R0989 Other specified symptoms and signs involving the circulatory and respiratory systems: Secondary | ICD-10-CM | POA: Diagnosis not present

## 2015-12-24 DIAGNOSIS — I10 Essential (primary) hypertension: Secondary | ICD-10-CM | POA: Diagnosis not present

## 2015-12-24 DIAGNOSIS — N400 Enlarged prostate without lower urinary tract symptoms: Secondary | ICD-10-CM | POA: Diagnosis not present

## 2015-12-24 DIAGNOSIS — L989 Disorder of the skin and subcutaneous tissue, unspecified: Secondary | ICD-10-CM | POA: Diagnosis not present

## 2015-12-24 DIAGNOSIS — Z96649 Presence of unspecified artificial hip joint: Secondary | ICD-10-CM | POA: Diagnosis not present

## 2015-12-24 DIAGNOSIS — M1990 Unspecified osteoarthritis, unspecified site: Secondary | ICD-10-CM | POA: Diagnosis not present

## 2016-01-05 DIAGNOSIS — D225 Melanocytic nevi of trunk: Secondary | ICD-10-CM | POA: Diagnosis not present

## 2016-01-05 DIAGNOSIS — L309 Dermatitis, unspecified: Secondary | ICD-10-CM | POA: Diagnosis not present

## 2016-01-05 DIAGNOSIS — L570 Actinic keratosis: Secondary | ICD-10-CM | POA: Diagnosis not present

## 2016-01-05 DIAGNOSIS — X32XXXD Exposure to sunlight, subsequent encounter: Secondary | ICD-10-CM | POA: Diagnosis not present

## 2016-01-05 DIAGNOSIS — L820 Inflamed seborrheic keratosis: Secondary | ICD-10-CM | POA: Diagnosis not present

## 2016-01-11 ENCOUNTER — Other Ambulatory Visit: Payer: Self-pay | Admitting: *Deleted

## 2016-01-11 ENCOUNTER — Encounter: Payer: Self-pay | Admitting: *Deleted

## 2016-01-11 NOTE — Patient Outreach (Signed)
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 He would like more information. I will send him a letter, brochure and a [facility] magnet and have encouraged him to call me back if he would like to pursue involvement or I will call him back in 10 days.  Deloria Lair [facility] [place] [phone]

## 2016-01-20 ENCOUNTER — Other Ambulatory Visit: Payer: Self-pay | Admitting: *Deleted

## 2016-01-20 ENCOUNTER — Ambulatory Visit: Payer: Self-pay | Admitting: *Deleted

## 2016-01-20 NOTE — Patient Outreach (Signed)
Called pt to follow up after sending [facility] information. Mr. Joshua Burton reports he did get the information. He states that his problematic health issues have resolved but he would like to refer his significant other, Joshua Burton, DOB: 05/31[birth year]54, for our services. She has early Alzheimers and they have been to the ED 3 times in the last month! She is seeing a neurologist at [facility] for the last 2 years and is well established there and they don't want to change to an in network provider. He reports he is taking Joshua Burton to see Dr. Drema [place] for her first physical on 01/27/16. I will call them on Monday, March 27th to set up an initial home visit to focus on Joshua Burton's care.  Deloria Lair [facility] [place] [phone]

## 2016-02-16 DIAGNOSIS — L2089 Other atopic dermatitis: Secondary | ICD-10-CM | POA: Diagnosis not present

## 2016-02-16 DIAGNOSIS — X32XXXD Exposure to sunlight, subsequent encounter: Secondary | ICD-10-CM | POA: Diagnosis not present

## 2016-02-16 DIAGNOSIS — L570 Actinic keratosis: Secondary | ICD-10-CM | POA: Diagnosis not present

## 2016-02-29 ENCOUNTER — Inpatient Hospital Stay (HOSPITAL_COMMUNITY)
Admission: EM | Admit: 2016-02-29 | Discharge: 2016-03-03 | DRG: 378 | Disposition: A | Discharge status: Home / Self Care | Payer: Commercial Managed Care - HMO | Attending: Family Medicine | Admitting: Family Medicine

## 2016-02-29 ENCOUNTER — Encounter (HOSPITAL_COMMUNITY)
Admission: EM | Disposition: A | Discharge status: Home / Self Care | Payer: Self-pay | Source: Home / Self Care | Attending: Family Medicine

## 2016-02-29 ENCOUNTER — Encounter (HOSPITAL_COMMUNITY): Payer: Self-pay | Admitting: Emergency Medicine

## 2016-02-29 DIAGNOSIS — I952 Hypotension due to drugs: Secondary | ICD-10-CM | POA: Diagnosis not present

## 2016-02-29 DIAGNOSIS — K7469 Other cirrhosis of liver: Secondary | ICD-10-CM | POA: Diagnosis not present

## 2016-02-29 DIAGNOSIS — R51 Headache: Secondary | ICD-10-CM | POA: Diagnosis not present

## 2016-02-29 DIAGNOSIS — K7460 Unspecified cirrhosis of liver: Secondary | ICD-10-CM | POA: Diagnosis not present

## 2016-02-29 DIAGNOSIS — I8500 Esophageal varices without bleeding: Secondary | ICD-10-CM | POA: Diagnosis present

## 2016-02-29 DIAGNOSIS — K7030 Alcoholic cirrhosis of liver without ascites: Secondary | ICD-10-CM | POA: Diagnosis not present

## 2016-02-29 DIAGNOSIS — D62 Acute posthemorrhagic anemia: Secondary | ICD-10-CM | POA: Diagnosis not present

## 2016-02-29 DIAGNOSIS — K921 Melena: Secondary | ICD-10-CM | POA: Diagnosis not present

## 2016-02-29 DIAGNOSIS — Y92230 Patient room in hospital as the place of occurrence of the external cause: Secondary | ICD-10-CM | POA: Diagnosis not present

## 2016-02-29 DIAGNOSIS — R7989 Other specified abnormal findings of blood chemistry: Secondary | ICD-10-CM

## 2016-02-29 DIAGNOSIS — T465X5A Adverse effect of other antihypertensive drugs, initial encounter: Secondary | ICD-10-CM | POA: Diagnosis present

## 2016-02-29 DIAGNOSIS — K922 Gastrointestinal hemorrhage, unspecified: Secondary | ICD-10-CM | POA: Diagnosis not present

## 2016-02-29 DIAGNOSIS — R188 Other ascites: Secondary | ICD-10-CM | POA: Diagnosis not present

## 2016-02-29 DIAGNOSIS — I959 Hypotension, unspecified: Secondary | ICD-10-CM | POA: Diagnosis present

## 2016-02-29 DIAGNOSIS — R748 Abnormal levels of other serum enzymes: Secondary | ICD-10-CM | POA: Diagnosis present

## 2016-02-29 DIAGNOSIS — D649 Anemia, unspecified: Secondary | ICD-10-CM | POA: Diagnosis not present

## 2016-02-29 DIAGNOSIS — R778 Other specified abnormalities of plasma proteins: Secondary | ICD-10-CM | POA: Diagnosis present

## 2016-02-29 DIAGNOSIS — R000 Tachycardia, unspecified: Secondary | ICD-10-CM | POA: Diagnosis not present

## 2016-02-29 DIAGNOSIS — I10 Essential (primary) hypertension: Secondary | ICD-10-CM | POA: Diagnosis not present

## 2016-02-29 DIAGNOSIS — I8511 Secondary esophageal varices with bleeding: Secondary | ICD-10-CM

## 2016-02-29 DIAGNOSIS — Z8249 Family history of ischemic heart disease and other diseases of the circulatory system: Secondary | ICD-10-CM

## 2016-02-29 DIAGNOSIS — Z79899 Other long term (current) drug therapy: Secondary | ICD-10-CM | POA: Diagnosis not present

## 2016-02-29 DIAGNOSIS — K625 Hemorrhage of anus and rectum: Secondary | ICD-10-CM | POA: Diagnosis not present

## 2016-02-29 DIAGNOSIS — D696 Thrombocytopenia, unspecified: Secondary | ICD-10-CM | POA: Diagnosis present

## 2016-02-29 DIAGNOSIS — K766 Portal hypertension: Secondary | ICD-10-CM | POA: Diagnosis present

## 2016-02-29 DIAGNOSIS — B1920 Unspecified viral hepatitis C without hepatic coma: Secondary | ICD-10-CM | POA: Diagnosis present

## 2016-02-29 DIAGNOSIS — R404 Transient alteration of awareness: Secondary | ICD-10-CM | POA: Diagnosis not present

## 2016-02-29 DIAGNOSIS — R42 Dizziness and giddiness: Secondary | ICD-10-CM | POA: Diagnosis not present

## 2016-02-29 HISTORY — DX: Thrombocytopenia, unspecified: D69.6

## 2016-02-29 HISTORY — DX: Alcoholic cirrhosis of liver without ascites: K70.30

## 2016-02-29 HISTORY — PX: ESOPHAGOGASTRODUODENOSCOPY: SHX5428

## 2016-02-29 HISTORY — DX: Esophageal varices without bleeding: I85.00

## 2016-02-29 HISTORY — DX: Portal hypertension: K76.6

## 2016-02-29 HISTORY — DX: Osteomyelitis, unspecified: M86.9

## 2016-02-29 LAB — COMPREHENSIVE METABOLIC PANEL
ALBUMIN: 3.9 g/dL (ref 3.5–5.0)
ALT: 23 U/L (ref 17–63)
ANION GAP: 9 (ref 5–15)
AST: 25 U/L (ref 15–41)
Alkaline Phosphatase: 45 U/L (ref 38–126)
BUN: 63 mg/dL — AB (ref 6–20)
CALCIUM: 8.6 mg/dL — AB (ref 8.9–10.3)
CHLORIDE: 111 mmol/L (ref 101–111)
CO2: 19 mmol/L — AB (ref 22–32)
Creatinine, Ser: 0.83 mg/dL (ref 0.61–1.24)
GFR calc Af Amer: >60 mL/min (ref >60)
GFR calc non Af Amer: >60 mL/min (ref >60)
GLUCOSE: 119 mg/dL — AB (ref 65–99)
POTASSIUM: 4 mmol/L (ref 3.5–5.1)
SODIUM: 139 mmol/L (ref 135–145)
Total Bilirubin: 0.8 mg/dL (ref 0.3–1.2)
Total Protein: 6.3 g/dL — ABNORMAL LOW (ref 6.5–8.1)

## 2016-02-29 LAB — URINALYSIS, ROUTINE W REFLEX MICROSCOPIC
BILIRUBIN URINE: NEGATIVE
Glucose, UA: NEGATIVE mg/dL
Hgb urine dipstick: NEGATIVE
Ketones, ur: NEGATIVE mg/dL
LEUKOCYTES UA: NEGATIVE
NITRITE: NEGATIVE
Protein, ur: NEGATIVE mg/dL
SPECIFIC GRAVITY, URINE: 1.018 (ref 1.005–1.030)
pH: 6 (ref 5.0–8.0)

## 2016-02-29 LAB — CBC WITH DIFFERENTIAL/PLATELET
BASOS ABS: 0 10*3/uL (ref 0.0–0.1)
Basophils Relative: 0 %
EOS ABS: 0 10*3/uL (ref 0.0–0.7)
EOS PCT: 0 %
HCT: 24.1 % — ABNORMAL LOW (ref 39.0–52.0)
HEMOGLOBIN: 8 g/dL — AB (ref 13.0–17.0)
LYMPHS ABS: 1.3 10*3/uL (ref 0.7–4.0)
LYMPHS PCT: 11 %
MCH: 28.7 pg (ref 26.0–34.0)
MCHC: 33.2 g/dL (ref 30.0–36.0)
MCV: 86.4 fL (ref 78.0–100.0)
Monocytes Absolute: 1.1 10*3/uL — ABNORMAL HIGH (ref 0.1–1.0)
Monocytes Relative: 10 %
NEUTROS PCT: 79 %
Neutro Abs: 8.8 10*3/uL — ABNORMAL HIGH (ref 1.7–7.7)
PLATELETS: 201 10*3/uL (ref 150–400)
RBC: 2.79 MIL/uL — AB (ref 4.22–5.81)
RDW: 16.9 % — ABNORMAL HIGH (ref 11.5–15.5)
WBC: 11.3 10*3/uL — AB (ref 4.0–10.5)

## 2016-02-29 LAB — PROTIME-INR
INR: 1.21 (ref 0.00–1.49)
PROTHROMBIN TIME: 15.4 s — AB (ref 11.6–15.2)

## 2016-02-29 LAB — PREPARE RBC (CROSSMATCH)

## 2016-02-29 LAB — MRSA PCR SCREENING: MRSA by PCR: NEGATIVE

## 2016-02-29 LAB — TROPONIN I
TROPONIN I: 0.07 ng/mL — AB (ref <0.031)
Troponin I: 0.05 ng/mL — ABNORMAL HIGH (ref <0.031)

## 2016-02-29 LAB — POC OCCULT BLOOD, ED: Fecal Occult Bld: POSITIVE — AB

## 2016-02-29 LAB — ETHANOL: Alcohol, Ethyl (B): <5 mg/dL (ref <5)

## 2016-02-29 LAB — LIPASE, BLOOD: LIPASE: 26 U/L (ref 11–51)

## 2016-02-29 SURGERY — EGD (ESOPHAGOGASTRODUODENOSCOPY)
Anesthesia: Moderate Sedation

## 2016-02-29 MED ORDER — SODIUM CHLORIDE 0.9 % IV SOLN: 10.0000 mL/h | Freq: Once | INTRAVENOUS | Status: DC

## 2016-02-29 MED ORDER — SODIUM CHLORIDE 0.9 % IV SOLN
Freq: Once | INTRAVENOUS | Status: AC
  Administered 2016-02-29: 22:00:00 via INTRAVENOUS

## 2016-02-29 MED ORDER — OCTREOTIDE LOAD VIA INFUSION
50.0000 ug | Freq: Once | INTRAVENOUS | Status: AC
  Administered 2016-02-29: 50 ug via INTRAVENOUS
  Filled 2016-02-29: qty 25

## 2016-02-29 MED ORDER — SPIRONOLACTONE 25 MG PO TABS: 100.0000 mg | ORAL_TABLET | Freq: Every day | ORAL | Status: DC

## 2016-02-29 MED ORDER — MIDAZOLAM HCL 5 MG/ML IJ SOLN
INTRAMUSCULAR | Status: AC
  Filled 2016-02-29: qty 3

## 2016-02-29 MED ORDER — OCTREOTIDE ACETATE 500 MCG/ML IJ SOLN
50.0000 ug/h | INTRAMUSCULAR | Status: DC
  Administered 2016-02-29 – 2016-03-01 (×3): 50 ug/h via INTRAVENOUS
  Filled 2016-02-29 (×3): qty 1

## 2016-02-29 MED ORDER — SODIUM CHLORIDE 0.9 % IV SOLN
80.0000 mg | Freq: Once | INTRAVENOUS | Status: AC
  Administered 2016-02-29: 80 mg via INTRAVENOUS
  Filled 2016-02-29: qty 80

## 2016-02-29 MED ORDER — FENTANYL CITRATE (PF) 100 MCG/2ML IJ SOLN
INTRAMUSCULAR | Status: DC | PRN
  Administered 2016-02-29 (×4): 25 ug via INTRAVENOUS

## 2016-02-29 MED ORDER — ONDANSETRON HCL 4 MG PO TABS: 4.0000 mg | ORAL_TABLET | Freq: Four times a day (QID) | ORAL | Status: DC | PRN

## 2016-02-29 MED ORDER — CLONIDINE HCL 0.1 MG PO TABS
0.2000 mg | ORAL_TABLET | Freq: Three times a day (TID) | ORAL | Status: DC
  Administered 2016-02-29 – 2016-03-01 (×2): 0.2 mg via ORAL
  Filled 2016-02-29 (×2): qty 2

## 2016-02-29 MED ORDER — SODIUM CHLORIDE 0.9 % IV BOLUS (SEPSIS)
1000.0000 mL | Freq: Once | INTRAVENOUS | Status: AC
  Administered 2016-02-29: 1000 mL via INTRAVENOUS

## 2016-02-29 MED ORDER — OCTREOTIDE LOAD VIA INFUSION
50.0000 ug | Freq: Once | INTRAVENOUS | Status: DC
  Filled 2016-02-29: qty 25

## 2016-02-29 MED ORDER — MORPHINE SULFATE (PF) 2 MG/ML IV SOLN
2.0000 mg | INTRAVENOUS | Status: AC | PRN
  Administered 2016-02-29 (×2): 2 mg via INTRAVENOUS
  Filled 2016-02-29 (×2): qty 1

## 2016-02-29 MED ORDER — SODIUM CHLORIDE 0.9 % IV SOLN
8.0000 mg/h | INTRAVENOUS | Status: DC
  Administered 2016-02-29: 8 mg/h via INTRAVENOUS
  Filled 2016-02-29 (×4): qty 80

## 2016-02-29 MED ORDER — SODIUM CHLORIDE 0.9% FLUSH
3.0000 mL | Freq: Two times a day (BID) | INTRAVENOUS | Status: DC
  Administered 2016-02-29 – 2016-03-02 (×2): 3 mL via INTRAVENOUS

## 2016-02-29 MED ORDER — NADOLOL 40 MG PO TABS
40.0000 mg | ORAL_TABLET | Freq: Every day | ORAL | Status: DC
  Administered 2016-03-01: 40 mg via ORAL
  Filled 2016-02-29 (×2): qty 1

## 2016-02-29 MED ORDER — MIDAZOLAM HCL 10 MG/2ML IJ SOLN
INTRAMUSCULAR | Status: DC | PRN
  Administered 2016-02-29: 1 mg via INTRAVENOUS
  Administered 2016-02-29 (×4): 2 mg via INTRAVENOUS

## 2016-02-29 MED ORDER — CEFTRIAXONE SODIUM 2 G IJ SOLR
2.0000 g | Freq: Once | INTRAMUSCULAR | Status: AC
  Administered 2016-02-29: 2 g via INTRAVENOUS
  Filled 2016-02-29: qty 2

## 2016-02-29 MED ORDER — SODIUM CHLORIDE 0.9 % IV SOLN
INTRAVENOUS | Status: AC
  Administered 2016-02-29: 22:00:00 via INTRAVENOUS

## 2016-02-29 MED ORDER — SODIUM CHLORIDE 0.9 % IV SOLN
50.0000 ug/h | INTRAVENOUS | Status: DC
  Filled 2016-02-29: qty 1

## 2016-02-29 MED ORDER — FENTANYL CITRATE (PF) 100 MCG/2ML IJ SOLN
INTRAMUSCULAR | Status: AC
  Filled 2016-02-29: qty 2

## 2016-02-29 MED ORDER — ONDANSETRON HCL 4 MG/2ML IJ SOLN: 4.0000 mg | Freq: Four times a day (QID) | INTRAMUSCULAR | Status: DC | PRN

## 2016-02-29 MED ORDER — SODIUM CHLORIDE 0.9 % IV SOLN
INTRAVENOUS | Status: DC
  Administered 2016-03-01 – 2016-03-02 (×2): via INTRAVENOUS

## 2016-02-29 NOTE — ED Provider Notes (Signed)
CSN: [id number]     Arrival date & time 02/29/16  1529 History   First MD Initiated Contact with Patient 02/29/16 1551     Chief Complaint  Patient presents with  . GI Bleeding     HPI Pt was seen at 1555. Per EMS, pt's family and pt report, c/o gradual onset and persistence of multiple intermittent episodes of passing "dark tarry stools" since last night. Pt has passed approximately 4-5 of stools today PTA. Has been associated with nausea and lightheadedness when moving body positions and ambulating. EMS states pt was orthostatic on scene: laying BP 136/80-P110; sitting BP 90/60-P130. EMS gave IV zofran with good effect. Pt endorses hx of esophageal varices. Denies abd pain, no vomiting, no red blood in stools, no CP/palpitations, no SOB/cough, no back pain.     Past Medical History  Diagnosis Date  . Hypertension   . Osteoarthritis   . Blood transfusion without reported diagnosis     during hip replacement 2012  . Hepatitis C   . Alcoholic cirrhosis ([place])   . Esophageal varices ([place])   . Portal hypertension ([place])   . Thrombocytopenia ([place])   . Osteomyelitis [facility])    Past Surgical History  Procedure Laterality Date  . Hip surgery  2012    right  . Laparoscopic cholecystectomy  2008  . Fracture surgery  1975    tib/fib// right  . Leg infection  2014  . External fixation leg Right 02/25/2014    Procedure: EXTERNAL FIXATION LEG;  Surgeon: Marianna Payment, MD;  Location: [place];  Service: Orthopedics;  Laterality: Right;  . Esophagogastroduodenoscopy N/A 09/14/2014    Procedure: ESOPHAGOGASTRODUODENOSCOPY (EGD);  Surgeon: Jerene Bears, MD;  Location: [facility] ENDOSCOPY;  Service: Endoscopy;  Laterality: N/A;   Family History  Problem Relation Age of Onset  . Colon cancer Neg Hx   . Arthritis Mother   . Hypertension Father   . Arthritis Brother   . Hypertension Brother    Social History  Substance Use Topics  . Smoking status: Never Smoker   . Smokeless tobacco: Never Used  .  Alcohol Use: Yes     Comment: Daily. 1-2 beers a day. Last drink: Saturday    Review of Systems ROS: Statement: All systems negative except as marked or noted in the HPI; Constitutional: Negative for fever and chills. ; ; Eyes: Negative for eye pain, redness and discharge. ; ; ENMT: Negative for ear pain, hoarseness, nasal congestion, sinus pressure and sore throat. ; ; Cardiovascular: Negative for chest pain, palpitations, diaphoresis, dyspnea and peripheral edema. ; ; Respiratory: Negative for cough, wheezing and stridor. ; ; Gastrointestinal: +nausea, "black stools." Negative for vomiting, abdominal pain, blood in stool, hematemesis, jaundice and rectal bleeding. . ; ; Genitourinary: Negative for dysuria, flank pain and hematuria. ; ; Musculoskeletal: Negative for back pain and neck pain. Negative for swelling and trauma.; ; Skin: Negative for pruritus, rash, abrasions, blisters, bruising and skin lesion.; ; Neuro: +lightheadedness. Negative for headache and neck stiffness. Negative for weakness, altered level of consciousness , altered mental status, extremity weakness, paresthesias, involuntary movement, seizure and syncope.      Allergies  Review of patient's allergies indicates no known allergies.  Home Medications   Prior to Admission medications   Medication Sig Start Date End Date Taking? Authorizing Provider  cloNIDine (CATAPRES) 0.2 MG tablet Take 0.2 mg by mouth 3 (three) times daily. 01/03/14   Jearld Fenton, NP  doxycycline (VIBRA-TABS) 100 MG tablet Take  1 tablet (100 mg total) by mouth 2 (two) times daily. 07/20/15   Gregor Hams, MD  meloxicam (MOBIC) 7.5 MG tablet Take 7.5 mg by mouth daily.    Historical Provider, MD  nadolol (CORGARD) 40 MG tablet Take 1 tablet (40 mg total) by mouth daily. Patient not taking: Reported on 07/20/2015 09/16/14   Ripudeep Krystal Eaton, MD  pantoprazole (PROTONIX) 40 MG tablet Take 1 tablet (40 mg total) by mouth daily. 09/16/14   Ripudeep Krystal Eaton, MD   promethazine (PHENERGAN) 12.5 MG tablet Take 1 tablet (12.5 mg total) by mouth every 6 (six) hours as needed for nausea or vomiting. 09/16/14   Ripudeep Krystal Eaton, MD  spironolactone (ALDACTONE) 100 MG tablet Take 100 mg by mouth daily.    Historical Provider, MD   BP 134/83 mmHg  Pulse 132  Temp(Src) 98.2 F (36.8 C) (Oral)  Resp 17  Ht 5\' 11"  (1.803 m)  Wt 200 lb (90.719 kg)  BMI 27.91 kg/m2  SpO2 100%   16:23 Orthostatic Vital Signs AH  Orthostatic Lying  - BP- Lying: 134/83 mmHg ; Pulse- Lying: 121  Orthostatic Sitting - BP- Sitting: 106/65 mmHg ; Pulse- Sitting: 136  Orthostatic Standing at 0 minutes - BP- Standing at 0 minutes: 132/105 mmHg ; Pulse- Standing at 0 minutes: 155      Physical Exam  1600: Physical examination:  Nursing notes reviewed; Vital signs and O2 SAT reviewed;  Constitutional: Well developed, Well nourished, Well hydrated, In no acute distress; Head:  Normocephalic, atraumatic; Eyes: EOMI, PERRL, No scleral icterus; ENMT: Mouth and pharynx normal, Mucous membranes moist; Neck: Supple, Full range of motion, No lymphadenopathy; Cardiovascular: Tachycardic rate and rhythm, No gallop; Respiratory: Breath sounds clear & equal bilaterally, No wheezes.  Speaking full sentences with ease, Normal respiratory effort/excursion; Chest: Nontender, Movement normal; Abdomen: Soft, Nontender, Nondistended, Normal bowel sounds. Rectal exam performed w/permission of pt and ED RN chaperone present.  Anal tone normal.  Non-tender, black stool in rectal vault, heme positive.  No fissures, no external hemorrhoids, no palp masses.; Genitourinary: No CVA tenderness; Extremities: Pulses normal, No tenderness, No edema, No calf edema or asymmetry.; Neuro: AA&Ox3, Major CN grossly intact.  Speech clear. No gross focal motor or sensory deficits in extremities.; Skin: Color pale, Warm, Dry.   ED Course  Procedures (including critical care time) Labs Review  Imaging Review  I have personally  reviewed and evaluated these images and lab results as part of my medical decision-making.   EKG Interpretation   Date/Time:  Monday February 29 2016 15:59:17 EDT Ventricular Rate:  123 PR Interval:  138 QRS Duration: 91 QT Interval:  337 QTC Calculation: 482 R Axis:   67 Text Interpretation:  Sinus tachycardia Nonspecific ST and T wave  abnormality Borderline prolonged QT interval Artifact When compared with  ECG of 09/13/2014 No significant change was found Confirmed by [facility]  MD,  Rayonna Heldman [phone]) on 02/29/2016 4:18:59 PM    EKG Interpretation  Date/Time:  Monday February 29 2016 17:46:29 EDT Ventricular Rate:  121 PR Interval:  142 QRS Duration: 86 QT Interval:  333 QTC Calculation: 472 R Axis:   53 Text Interpretation:  Sinus tachycardia Abnormal R-wave progression, early transition Nonspecific ST and T wave abnormality Since last tracing of earlier today No significant change was found Confirmed by [facility]  MD, Nunzio Cory [phone]) on 02/29/2016 6:05:54 PM         MDM  MDM Reviewed: previous chart, nursing note and vitals Reviewed previous:  labs and ECG Interpretation: labs and ECG Total time providing critical care: 30-74 minutes. This excludes time spent performing separately reportable procedures and services. Consults: gastrointestinal and admitting MD    CRITICAL CARE Performed by: Alfonzo Feller Total critical care time: 35 minutes Critical care time was exclusive of separately billable procedures and treating other patients. Critical care was necessary to treat or prevent imminent or life-threatening deterioration. Critical care was time spent personally by me on the following activities: development of treatment plan with patient and/or surrogate as well as nursing, discussions with consultants, evaluation of patient's response to treatment, examination of patient, obtaining history from patient or surrogate, ordering and performing treatments and interventions,  ordering and review of laboratory studies, ordering and review of radiographic studies, pulse oximetry and re-evaluation of patient's condition.    Results for orders placed or performed during the hospital encounter of 02/29/16  Troponin I  Result Value Ref Range   Troponin I 0.07 (H) <0.031 ng/mL  CBC with Differential  Result Value Ref Range   WBC 11.3 (H) 4.0 - 10.5 K/uL   RBC 2.79 (L) 4.22 - 5.81 MIL/uL   Hemoglobin 8.0 (L) 13.0 - 17.0 g/dL   HCT 24.1 (L) 39.0 - 52.0 %   MCV 86.4 78.0 - 100.0 fL   MCH 28.7 26.0 - 34.0 pg   MCHC 33.2 30.0 - 36.0 g/dL   RDW 16.9 (H) 11.5 - 15.5 %   Platelets 201 150 - 400 K/uL   Neutrophils Relative % 79 %   Neutro Abs 8.8 (H) 1.7 - 7.7 K/uL   Lymphocytes Relative 11 %   Lymphs Abs 1.3 0.7 - 4.0 K/uL   Monocytes Relative 10 %   Monocytes Absolute 1.1 (H) 0.1 - 1.0 K/uL   Eosinophils Relative 0 %   Eosinophils Absolute 0.0 0.0 - 0.7 K/uL   Basophils Relative 0 %   Basophils Absolute 0.0 0.0 - 0.1 K/uL  Protime-INR  Result Value Ref Range   Prothrombin Time 15.4 (H) 11.6 - 15.2 seconds   INR 1.21 0.00 - 1.49  Comprehensive metabolic panel  Result Value Ref Range   Sodium 139 135 - 145 mmol/L   Potassium 4.0 3.5 - 5.1 mmol/L   Chloride 111 101 - 111 mmol/L   CO2 19 (L) 22 - 32 mmol/L   Glucose, Bld 119 (H) 65 - 99 mg/dL   BUN 63 (H) 6 - 20 mg/dL   Creatinine, Ser 0.83 0.61 - 1.24 mg/dL   Calcium 8.6 (L) 8.9 - 10.3 mg/dL   Total Protein 6.3 (L) 6.5 - 8.1 g/dL   Albumin 3.9 3.5 - 5.0 g/dL   AST 25 15 - 41 U/L   ALT 23 17 - 63 U/L   Alkaline Phosphatase 45 38 - 126 U/L   Total Bilirubin 0.8 0.3 - 1.2 mg/dL   GFR calc non Af Amer >60 >60 mL/min   GFR calc Af Amer >60 >60 mL/min   Anion gap 9 5 - 15  Ethanol  Result Value Ref Range   Alcohol, Ethyl (B) <5 <5 mg/dL  Urinalysis, Routine w reflex microscopic  Result Value Ref Range   Color, Urine YELLOW YELLOW   APPearance CLEAR CLEAR   Specific Gravity, Urine 1.018 1.005 - 1.030    pH 6.0 5.0 - 8.0   Glucose, UA NEGATIVE NEGATIVE mg/dL   Hgb urine dipstick NEGATIVE NEGATIVE   Bilirubin Urine NEGATIVE NEGATIVE   Ketones, ur NEGATIVE NEGATIVE mg/dL   Protein, ur NEGATIVE  NEGATIVE mg/dL   Nitrite NEGATIVE NEGATIVE   Leukocytes, UA NEGATIVE NEGATIVE  Lipase, blood  Result Value Ref Range   Lipase 26 11 - 51 U/L  POC occult blood, ED  Result Value Ref Range   Fecal Occult Bld POSITIVE (A) NEGATIVE  Type and screen  Result Value Ref Range   ABO/RH(D) B POS    Antibody Screen NEG    Sample Expiration 03/03/2016     1700:  T/C to [place], case discussed, including:  HPI, pertinent PM/SHx, VS/PE, dx testing, ED course and treatment:  Agreeable to consult. IV protonix bolus and gtt ordered, IVF ordered for tachycardia.  1730:  T/C from [place], case discussed, including:  HPI, pertinent PM/SHx, VS/PE, dx testing, ED course and treatment:  Requests to call [facility] MD to consult.   1815:  2nd EKG without change from first. Pt denies CP. T/C to [facility] Dr. Amedeo Plenty, case discussed, including:  HPI, pertinent PM/SHx, VS/PE, dx testing, ED course and treatment:  Agreeable to consult, requests to start IV octreotide, transfuse 1 unit PRBC's, keep NPO, he will likely perform EGD tonight. Dx and testing, as well as d/w GI MD, d/w pt and family.  Questions answered.  Verb understanding, agreeable to admit.  T/C to [facility]. Eulas Post, case discussed, including:  HPI, pertinent PM/SHx, VS/PE, dx testing, ED course and treatment:  Agreeable to admit, requests to write temporary orders, obtain stepdown bed to team WLAdmits.    Francine Graven, DO 03/02/16 [phone]

## 2016-02-29 NOTE — ED Notes (Signed)
Procedure finished back from endo  Pt alert and orientated

## 2016-02-29 NOTE — Op Note (Signed)
[facility] Patient Name: Joshua Burton Procedure Date: 02/29/2016 MRN: [id number] Attending MD: Missy Sabins , MD Date of Birth: [birth year] CSN:  Age: 65 Admit Type: Outpatient Procedure:                Upper GI endoscopy Indications:              Melena Providers:                Elyse Jarvis. Amedeo Plenty, MD, Laverta Baltimore, RN, Alfonso Patten, Technician Referring MD:              Medicines:                Fentanyl 100 micrograms IV, Midazolam 7 mg IV Complications:            No immediate complications. Estimated Blood Loss:     Estimated blood loss: none. Procedure:                Pre-Anesthesia Assessment:                           - Prior to the procedure, a History and Physical                            was performed, and patient medications and                            allergies were reviewed. The patient's tolerance of                            previous anesthesia was also reviewed. The risks                            and benefits of the procedure and the sedation                            options and risks were discussed with the patient.                            All questions were answered, and informed consent                            was obtained. Prior Anticoagulants: The patient has                            taken no previous anticoagulant or antiplatelet                            agents. ASA Grade Assessment: III - A patient with                            severe systemic disease. After reviewing the risks  and benefits, the patient was deemed in                            satisfactory condition to undergo the procedure.                           After obtaining informed consent, the endoscope was                            passed under direct vision. Throughout the                            procedure, the patient's blood pressure, pulse, and                            oxygen saturations were  monitored continuously. The                            Endoscope was introduced through the mouth, and                            advanced to the second part of duodenum. The upper                            GI endoscopy was accomplished without difficulty.                            The patient tolerated the procedure well. Scope In: Scope Out: Findings:      Grade I varices were found in the lower third of the esophagus.      Hematin (altered blood/coffee-ground-like material) was found in the       entire examined stomach.      The exam of the stomach was otherwise normal.      There is no endoscopic evidence of erythema or inflammatory changes       suggestive of gastritis, inflammation, mucosal abnormalities or       ulceration in the entire examined stomach.      The examined duodenum was normal. Impression:               - Grade I esophageal varices.                           - Hematin (altered blood/coffee-ground-like                            material) in the entire stomach.                           - Normal examined duodenum.                           - No specimens collected. Moderate Sedation:      Moderate (conscious) sedation was administered by the endoscopy nurse       and supervised by the endoscopist. The following parameters were       monitored: oxygen saturation, heart  rate, blood pressure, respiratory       rate, EKG, adequacy of pulmonary ventilation, and response to care. Recommendation:           - Return patient to ICU for ongoing care.                           - Clear liquid diet.                           - Continue present medications. Procedure Code(s):        --- Professional ---                           [phone], Esophagogastroduodenoscopy, flexible,                            transoral; diagnostic, including collection of                            specimen(s) by brushing or washing, when performed                            (separate  procedure) Diagnosis Code(s):        --- Professional ---                           I85.00, Esophageal varices without bleeding                           K92.2, Gastrointestinal hemorrhage, unspecified                           K92.1, Melena (includes Hematochezia) CPT copyright 2016 American Medical Association. All rights reserved. The codes documented in this report are preliminary and upon coder review may  be revised to meet current compliance requirements. Teena Irani, MD Missy Sabins, MD 02/29/2016 8:32:47 PM This report has been signed electronically. Number of Addenda: 0

## 2016-02-29 NOTE — ED Notes (Signed)
Called 2w for report and heads on transport. Nurse asked for 10 mins and call back  Room 1231 assignment given via phone call from floor during procedure.

## 2016-02-29 NOTE — H&P (Signed)
[facility] History and Physical  KALUM WALLAR [id number] DOB: 10-Jul-1951 DOA: 02/29/2016  PCP: Gerrit Heck, MD  Specialists: Dr. Orvis Brill, [facility] hepatology  Chief Complaint: Melena  HPI: Joshua Burton is a 65 y.o. gentleman with a history of cirrhosis secondary to hepatitis C, esophageal varices (previous banding x1, last surveillance EGD approximately 2 months ago through [facility]), and HTN who feels that he was in his baseline state of health until yesterday.  He had abdominal pain during the day and by last night, he had developed melena.  He has had 4-5 black tarry stools.  He has had light-headedness and dizziness with standing.  No nausea or vomiting.  Hemoglobin today is 8; his baseline is around 9.  However, he is orthostatic and certainly high risk given his history of varices.  Hospitalist asked to admit for further evaluation and treatment.  Dr. Amedeo Plenty with [facility] was consulted from the ED.  Review of Systems: 12 systems reviewed and negative except as stated in the HPI.  Past Medical History  Diagnosis Date  . Hypertension   . Osteoarthritis   . Blood transfusion without reported diagnosis     during hip replacement 2012  . Hepatitis C   . Alcoholic cirrhosis ([place])   . Esophageal varices ([place])   . Portal hypertension ([place])   . Thrombocytopenia ([place])   . Osteomyelitis [facility])    Past Surgical History  Procedure Laterality Date  . Hip surgery  2012    right  . Laparoscopic cholecystectomy  2008  . Fracture surgery  1975    tib/fib// right  . Leg infection  2014  . External fixation leg Right 02/25/2014    Procedure: EXTERNAL FIXATION LEG;  Surgeon: Marianna Payment, MD;  Location: [place];  Service: Orthopedics;  Laterality: Right;  . Esophagogastroduodenoscopy N/A 09/14/2014    Procedure: ESOPHAGOGASTRODUODENOSCOPY (EGD);  Surgeon: Jerene Bears, MD;  Location[facility] ENDOSCOPY;  Service: Endoscopy;  Laterality: N/A;   Social History:  Social History    Social History Narrative   Regular exercise-yes   Caffeine Use-no  No tobacco.  Rare EtOH (beer).  No illicit drug use.  He has a significant other, who is present at bedside.  No Known Allergies  Family History  Problem Relation Age of Onset  . Colon cancer Neg Hx   . Arthritis Mother   . Hypertension Father   . Arthritis Brother   . Hypertension Brother    Prior to Admission medications   Medication Sig Start Date End Date Taking? Authorizing Provider  cloNIDine (CATAPRES) 0.2 MG tablet Take 0.2 mg by mouth 3 (three) times daily. 01/03/14  Yes Jearld Fenton, NP  fluocinonide cream (LIDEX) [id number] % Apply 1 application topically 2 (two) times daily as needed. *Not to face, groin, or axilla.* 01/05/16  Yes Historical Provider, MD  meloxicam (MOBIC) 15 MG tablet Take 15 mg by mouth daily as needed for pain.  12/07/15  Yes Historical Provider, MD  spironolactone (ALDACTONE) 100 MG tablet Take 100 mg by mouth daily.   Yes Historical Provider, MD  nadolol (CORGARD) 40 MG tablet Take 1 tablet (40 mg total) by mouth daily. Patient not taking: Reported on 07/20/2015 09/16/14   Ripudeep Krystal Eaton, MD  pantoprazole (PROTONIX) 40 MG tablet Take 1 tablet (40 mg total) by mouth daily. Patient not taking: Reported on 02/29/2016 09/16/14   Ripudeep Krystal Eaton, MD   Physical Exam: Filed Vitals:   02/29/16 1950 02/29/16 1955 02/29/16 2000 02/29/16  2005  BP: 153/88 161/94 150/93 144/90  Pulse: 119 119 115 118  Temp:      TempSrc:      Resp: 19 17 14 8   Height:      Weight:      SpO2: 100% 100% 100% 100%     General:  Awake and alert.  Oriented to person, place, time and situation.  NAD.  Head: [place]/AT  Eyes: PERRL bilaterally, conjunctiva are pink.  EOMI.  ENT: Mucous membranes are moist.  No nasal drainage.  Neck is supple.  Cardiovascular: Mildly tachycardic but regular.  No significant pitting edema.  Respiratory: CTA bilaterally.  GI: Abdomen is slightly protuberant but soft and  compressible.  Diffuse tenderness but no guarding.  Bowel sounds are present.  Skin: Warm and dry.  Musculoskeletal: Moves all four extremities spontaneously.  ACE wrap to right ankle.  Psychiatric: Normal affect.  Neurologic: No focal deficits.  Labs on Admission:  Basic Metabolic Panel:  Recent Labs Lab 02/29/16 1611  NA 139  K 4.0  CL 111  CO2 19*  GLUCOSE 119*  BUN 63*  CREATININE 0.83  CALCIUM 8.6*   Liver Function Tests:  Recent Labs Lab 02/29/16 1611  AST 25  ALT 23  ALKPHOS 45  BILITOT 0.8  PROT 6.3*  ALBUMIN 3.9    Recent Labs Lab 02/29/16 1611  LIPASE 26   CBC:  Recent Labs Lab 02/29/16 1611  WBC 11.3*  NEUTROABS 8.8*  HGB 8.0*  HCT 24.1*  MCV 86.4  PLT 201   Cardiac Enzymes:  Recent Labs Lab 02/29/16 1611  TROPONINI 0.07*   EKG: Independently reviewed. Sinus tachycardia.  I do not see ST depressions greater than 10mm.  Assessment/Plan Principal Problem:   Acute blood loss anemia Active Problems:   Upper GI bleed   Cirrhosis (HCC)   Esophageal varices (HCC)   Acute upper GI bleed   Admit to the stepdown unit  Symptomatic anemia secondary to acute blood loss, presumed variceal bleed in a patient with a history of cirrhosis --Transfuse two units of PRBCs now --NPO --GI may scope tonight per ED physician; evaluation pending --IV protonix infusions --IV octreotide infusion  History of cirrhosis --Continue home medications as blood pressure will allow  Elevated troponin of unclear clinical significance --Will order repeat q6h x 2 to follow trend  Code Status: FULL Family Communication: Significant other at bedside  Disposition Plan: Expect he will be here at least two midnights  Time spent: 45 minutes  [employer] [facility]  02/29/2016, 8:07 PM

## 2016-02-29 NOTE — ED Notes (Signed)
Bed: RESA Expected date:  Expected time:  Means of arrival:  Comments: ENDO IN ROOM

## 2016-02-29 NOTE — ED Notes (Addendum)
Verbalized report from endo \ 100 fen tyle  Given  9 of versed given  Pre procedure start 07:56pm ( hr 125, sp02 99 [place], bp 153/88, rr 19, pain score 5 out 10 alert   Post procedure end 08:38pm ( hr115, spo2 99 [place], rr 14, c02 22, pt asleep -   Post procedure vital check 1 - hr 113, spo2 100 [place], bp 125/65, awake   New bolus bag given 1000n ns 09:00pm

## 2016-02-29 NOTE — ED Notes (Signed)
Bed: RESA Expected date:  Expected time:  Means of arrival:  Comments: No bed.

## 2016-02-29 NOTE — ED Notes (Signed)
Bed: WA07 Expected date:  Expected time:  Means of arrival:  Comments: EMS 

## 2016-02-29 NOTE — ED Notes (Signed)
Patient is from home and transported via [facility]. Pt is complaining of experiencing black, tarry stools 4-5 times today and once last night. Pt has a history of esophageal varices. Also,complains of nausea, feeling increased abd distention, and being dizzy, lightheaded when ambulating to the restroom. Pt was given ZOFRAN 4mg  via IV by EMS enroute. Also, orthostatics were performed: Laying: BP 136/80, HR 110 Resp 18; Sitting: BP 96/60, HR 130 Resp: 18.

## 2016-02-29 NOTE — ED Notes (Signed)
Pt cannot use restroom at this time, aware urine specimen is needed.  

## 2016-02-29 NOTE — ED Notes (Signed)
Patient signed Informed Consent For Blood and/or Blood Products Transfusion. Witnessed by Fuller Plan, RN
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
## 2016-02-29 NOTE — ED Notes (Signed)
Jewelry removed by Endo and given to wife

## 2016-03-01 ENCOUNTER — Encounter (HOSPITAL_COMMUNITY): Payer: Self-pay | Admitting: Gastroenterology

## 2016-03-01 LAB — BASIC METABOLIC PANEL
Anion gap: 8 (ref 5–15)
BUN: 43 mg/dL — AB (ref 6–20)
CALCIUM: 7.6 mg/dL — AB (ref 8.9–10.3)
CHLORIDE: 115 mmol/L — AB (ref 101–111)
CO2: 19 mmol/L — AB (ref 22–32)
CREATININE: 0.9 mg/dL (ref 0.61–1.24)
GFR calc Af Amer: >60 mL/min (ref >60)
GFR calc non Af Amer: >60 mL/min (ref >60)
GLUCOSE: 120 mg/dL — AB (ref 65–99)
Potassium: 3.9 mmol/L (ref 3.5–5.1)
Sodium: 142 mmol/L (ref 135–145)

## 2016-03-01 LAB — CBC
HEMATOCRIT: 22.9 % — AB (ref 39.0–52.0)
HEMOGLOBIN: 7.7 g/dL — AB (ref 13.0–17.0)
MCH: 29.4 pg (ref 26.0–34.0)
MCHC: 33.6 g/dL (ref 30.0–36.0)
MCV: 87.4 fL (ref 78.0–100.0)
Platelets: 109 10*3/uL — ABNORMAL LOW (ref 150–400)
RBC: 2.62 MIL/uL — AB (ref 4.22–5.81)
RDW: 16.8 % — AB (ref 11.5–15.5)
WBC: 6.3 10*3/uL (ref 4.0–10.5)

## 2016-03-01 LAB — CORTISOL: Cortisol, Plasma: 3.7 ug/dL

## 2016-03-01 LAB — HEMOGLOBIN AND HEMATOCRIT, BLOOD
HEMATOCRIT: 24.9 % — AB (ref 39.0–52.0)
Hemoglobin: 8.4 g/dL — ABNORMAL LOW (ref 13.0–17.0)

## 2016-03-01 LAB — TROPONIN I: Troponin I: 0.03 ng/mL (ref <0.031)

## 2016-03-01 MED ORDER — PANTOPRAZOLE SODIUM 40 MG PO TBEC
40.0000 mg | DELAYED_RELEASE_TABLET | Freq: Two times a day (BID) | ORAL | Status: DC
  Administered 2016-03-02 – 2016-03-03 (×3): 40 mg via ORAL
  Filled 2016-03-01 (×3): qty 1

## 2016-03-01 MED ORDER — MORPHINE SULFATE (PF) 2 MG/ML IV SOLN
2.0000 mg | INTRAVENOUS | Status: DC | PRN
Start: 2016-03-01 — End: 2016-03-02
  Administered 2016-03-01 – 2016-03-02 (×4): 2 mg via INTRAVENOUS
  Filled 2016-03-01 (×4): qty 1

## 2016-03-01 MED ORDER — PANTOPRAZOLE SODIUM 40 MG IV SOLR
40.0000 mg | Freq: Two times a day (BID) | INTRAVENOUS | Status: DC
  Administered 2016-03-01: 40 mg via INTRAVENOUS
  Filled 2016-03-01: qty 40

## 2016-03-01 MED ORDER — TRAMADOL HCL 50 MG PO TABS
50.0000 mg | ORAL_TABLET | Freq: Four times a day (QID) | ORAL | Status: DC | PRN
  Administered 2016-03-01 – 2016-03-03 (×5): 50 mg via ORAL
  Filled 2016-03-01 (×5): qty 1

## 2016-03-01 MED ORDER — SODIUM CHLORIDE 0.9 % IV BOLUS (SEPSIS)
500.0000 mL | Freq: Once | INTRAVENOUS | Status: AC
  Administered 2016-03-01: 500 mL via INTRAVENOUS

## 2016-03-01 MED ORDER — ACETAMINOPHEN 325 MG PO TABS
650.0000 mg | ORAL_TABLET | Freq: Four times a day (QID) | ORAL | Status: DC | PRN
  Administered 2016-03-01 (×2): 650 mg via ORAL
  Filled 2016-03-01 (×2): qty 2

## 2016-03-01 MED ORDER — SODIUM CHLORIDE 0.9 % IV BOLUS (SEPSIS)
250.0000 mL | Freq: Once | INTRAVENOUS | Status: AC
  Administered 2016-03-01: 250 mL via INTRAVENOUS

## 2016-03-01 NOTE — Care Management Note (Signed)
Case Management Note  Patient Details  Name: DHILAN SZALKOWSKI MRN: [id number] Date of Birth: Mar 12, 1951  Subjective/Objective:             Gi bleed       Action/Plan:Date:  March 01, 2016 Chart reviewed for concurrent status and case management needs. Will continue to follow patient for changes and needs: Velva Harman, BSN, RN, [place]   [phone]   Expected Discharge Date:   (unknown)               Expected Discharge Plan:  Home/Self Care  In-House Referral:  NA  Discharge planning Services  CM Consult  Post Acute Care Choice:  NA Choice offered to:  NA  DME Arranged:    DME Agency:     HH Arranged:    [place] Agency:     Status of Service:  Completed, signed off  Medicare Important Message Given:    Date Medicare IM Given:    Medicare IM give by:    Date Additional Medicare IM Given:    Additional Medicare Important Message give by:     If discussed at [place] of Stay Meetings, dates discussed:    Additional Comments:  Leeroy Cha, RN 03/01/2016, 9:46 AM

## 2016-03-01 NOTE — Progress Notes (Signed)
PROGRESS NOTE    Joshua Burton  [id number] DOB: 1950-11-21 DOA: 02/29/2016 PCP: Gerrit Heck, MD Outpatient Specialists:Dr. Jeanann Lewandowsky hepatology Brief Narrative: Joshua Burton is a 65 y.o. gentleman with a history of cirrhosis secondary to hepatitis C, esophageal varices (previous banding x1, last surveillance EGD approximately 2 months ago through [facility]), presented to ED due to melena and  4-5 black tarry stools.Hemoglobin was 8, was orthostatic. -Given 2 Units PRBC on admission, had EGD last pm per Dr.Hayes-grade 1 varices, no active bleeding noted  Assessment & Plan: Upper Gi Bleed/Melena -appears to have improved -EGD 4/24 with Grade 1 varices -change PPI to Q12, stop Octreotide gtt if ok with GI -start clears -cut down IVF  Symptomatic anemia secondary to acute blood loss -see above, s/p 2units PRBCs now -monitor Hb  History of cirrhosis -Continue nadolol with parameters  Hypertension -hold clonidine  Elevated troponin of unclear clinical significance --repeat normalized, no chest pain  DVT proph: SCDs  Code Status: FULL Family Communication: Wife at bedside  Disposition Plan: Tx to tele  Consultants:   Howie Ill  Procedures: EGD 4/24  Subjective: Feels better, no further melena, feels a little bloated  Objective: Filed Vitals:   03/01/16 0700 03/01/16 0756 03/01/16 0759 03/01/16 0900  BP: 96/54   140/78  Pulse: 85 87  91  Temp:   97.8 F (36.6 C)   TempSrc:   Axillary   Resp: 18 18  15   Height:      Weight:      SpO2: 99% 100%  98%    Intake/Output Summary (Last 24 hours) at 03/01/16 1120 Last data filed at 03/01/16 0600  Gross per 24 hour  Intake 2167.49 ml  Output    550 ml  Net 1617.49 ml   Filed Weights   02/29/16 1549 02/29/16 1936  Weight: 90.719 kg (200 lb) 90.719 kg (200 lb)    Examination:  General exam: Appears calm and comfortable , no distress Respiratory system: Clear to auscultation. Respiratory effort  normal. Cardiovascular system: S1 & S2 heard, RRR. No JVD, murmurs, rubs, gallops or clicks. No pedal edema. Gastrointestinal system: Abdomen is midly distended, soft and nontender. No organomegaly or masses felt. Normal bowel sounds heard. Central nervous system: Alert and oriented. No focal neurological deficits. Extremities: Symmetric 5 x 5 power. Skin: No rashes, lesions or ulcers Psychiatry: Judgement and insight appear normal. Mood & affect appropriate.     Data Reviewed: I have personally reviewed following labs and imaging studies  CBC:  Recent Labs Lab 02/29/16 1611 03/01/16 0806  WBC 11.3*  --   NEUTROABS 8.8*  --   HGB 8.0* 8.4*  HCT 24.1* 24.9*  MCV 86.4  --   PLT 201  --    Basic Metabolic Panel:  Recent Labs Lab 02/29/16 1611 03/01/16 0806  NA 139 142  K 4.0 3.9  CL 111 115*  CO2 19* 19*  GLUCOSE 119* 120*  BUN 63* 43*  CREATININE 0.83 0.90  CALCIUM 8.6* 7.6*   GFR: Estimated Creatinine Clearance: 96.8 mL/min (by C-G formula based on Cr of 0.9). Liver Function Tests:  Recent Labs Lab 02/29/16 1611  AST 25  ALT 23  ALKPHOS 45  BILITOT 0.8  PROT 6.3*  ALBUMIN 3.9    Recent Labs Lab 02/29/16 1611  LIPASE 26   No results for input(s): AMMONIA in the last 168 hours. Coagulation Profile:  Recent Labs Lab 02/29/16 1611  INR 1.21   Cardiac Enzymes:  Recent  Labs Lab 02/29/16 1611 02/29/16 2120 03/01/16 0806  TROPONINI 0.07* 0.05* 0.03   BNP (last 3 results) No results for input(s): PROBNP in the last 8760 hours. HbA1C: No results for input(s): HGBA1C in the last 72 hours. CBG: No results for input(s): GLUCAP in the last 168 hours. Lipid Profile: No results for input(s): CHOL, HDL, LDLCALC, TRIG, CHOLHDL, LDLDIRECT in the last 72 hours. Thyroid Function Tests: No results for input(s): TSH, T4TOTAL, FREET4, T3FREE, THYROIDAB in the last 72 hours. Anemia Panel: No results for input(s): VITAMINB12, FOLATE, FERRITIN, TIBC, IRON,  RETICCTPCT in the last 72 hours. Urine analysis:    Component Value Date/Time   COLORURINE YELLOW 02/29/2016 1607   APPEARANCEUR CLEAR 02/29/2016 1607   LABSPEC 1.018 02/29/2016 1607   PHURINE 6.0 02/29/2016 1607   GLUCOSEU NEGATIVE 02/29/2016 1607   HGBUR NEGATIVE 02/29/2016 1607   BILIRUBINUR NEGATIVE 02/29/2016 1607   KETONESUR NEGATIVE 02/29/2016 1607   PROTEINUR NEGATIVE 02/29/2016 1607   UROBILINOGEN 1.0 08/10/2008 2128   NITRITE NEGATIVE 02/29/2016 1607   LEUKOCYTESUR NEGATIVE 02/29/2016 1607   Sepsis Labs: @LABRCNTIP (procalcitonin:4,lacticidven:4)  ) Recent Results (from the past 240 hour(s))  MRSA PCR Screening     Status: None   Collection Time: 02/29/16  9:59 PM  Result Value Ref Range Status   MRSA by PCR NEGATIVE NEGATIVE Final    Comment:        The GeneXpert MRSA Assay (FDA approved for NASAL specimens only), is one component of a comprehensive MRSA colonization surveillance program. It is not intended to diagnose MRSA infection nor to guide or monitor treatment for MRSA infections.          Radiology Studies: No results found.      Scheduled Meds: . sodium chloride  10 mL/hr Intravenous Once  . cloNIDine  0.2 mg Oral TID  . nadolol  40 mg Oral Daily  . pantoprazole (PROTONIX) IV  40 mg Intravenous Q12H  . sodium chloride flush  3 mL Intravenous Q12H   Continuous Infusions: . sodium chloride    . octreotide  (SANDOSTATIN)    IV infusion 50 mcg/hr (03/01/16 0125)     LOS: 1 day    Time spent: 64min    Domenic Polite, MD [facility] Pager [phone]  If 7PM-7AM, please contact night-coverage [email or web address] Password [facility] 03/01/2016, 11:20 AM

## 2016-03-01 NOTE — Progress Notes (Signed)
Subjective: No further melena. Has headache. Is hungry.  Objective: Vital signs in last 24 hours: Temp:  [97.6 F (36.4 C)-99.1 F (37.3 C)] 97.6 F (36.4 C) (04/25 1529) Pulse Rate:  [66-132] 66 (04/25 1500) Resp:  [8-39] 17 (04/25 1500) BP: (70-162)/(41-104) 78/41 mmHg (04/25 1500) SpO2:  [97 %-100 %] 100 % (04/25 1500) Weight:  [90.719 kg (200 lb)] 90.719 kg (200 lb) (04/24 1936) Weight change:  Last BM Date: 02/28/16  PE: GEN:  NAD ABD:  Non-tender  Lab Results: CBC    Component Value Date/Time   WBC 11.3* 02/29/2016 1611   RBC 2.79* 02/29/2016 1611   HGB 8.4* 03/01/2016 0806   HCT 24.9* 03/01/2016 0806   PLT 201 02/29/2016 1611   MCV 86.4 02/29/2016 1611   MCH 28.7 02/29/2016 1611   MCHC 33.2 02/29/2016 1611   RDW 16.9* 02/29/2016 1611   LYMPHSABS 1.3 02/29/2016 1611   MONOABS 1.1* 02/29/2016 1611   EOSABS 0.0 02/29/2016 1611   BASOSABS 0.0 02/29/2016 1611   CMP     Component Value Date/Time   NA 142 03/01/2016 0806   K 3.9 03/01/2016 0806   CL 115* 03/01/2016 0806   CO2 19* 03/01/2016 0806   GLUCOSE 120* 03/01/2016 0806   BUN 43* 03/01/2016 0806   CREATININE 0.90 03/01/2016 0806   CALCIUM 7.6* 03/01/2016 0806   PROT 6.3* 02/29/2016 1611   ALBUMIN 3.9 02/29/2016 1611   AST 25 02/29/2016 1611   ALT 23 02/29/2016 1611   ALKPHOS 45 02/29/2016 1611   BILITOT 0.8 02/29/2016 1611   GFRNONAA >60 03/01/2016 0806   GFRAA >60 03/01/2016 0806    Assessment:  1.  Melena.  Old blood (hematin) seen in stomach, but no active source of bleeding identified.  No clinical evidence of ongoing active GI tract bleeding. 2.  Acute blood loss anemia.  Plan:  1.  Oral PPI. 2.  Start diet. 3.  Stop octretide. 4.  [facility] will follow.   Landry Dyke 03/01/2016, 3:32 PM   Pager [phone] If no answer or after 5 PM call [phone]

## 2016-03-02 DIAGNOSIS — D62 Acute posthemorrhagic anemia: Secondary | ICD-10-CM

## 2016-03-02 LAB — BASIC METABOLIC PANEL
ANION GAP: 7 (ref 5–15)
BUN: 26 mg/dL — AB (ref 6–20)
CALCIUM: 7.6 mg/dL — AB (ref 8.9–10.3)
CO2: 19 mmol/L — ABNORMAL LOW (ref 22–32)
Chloride: 114 mmol/L — ABNORMAL HIGH (ref 101–111)
Creatinine, Ser: 0.84 mg/dL (ref 0.61–1.24)
GFR calc Af Amer: >60 mL/min (ref >60)
GLUCOSE: 103 mg/dL — AB (ref 65–99)
POTASSIUM: 3.8 mmol/L (ref 3.5–5.1)
SODIUM: 140 mmol/L (ref 135–145)

## 2016-03-02 LAB — CBC
HCT: 25.1 % — ABNORMAL LOW (ref 39.0–52.0)
Hemoglobin: 8.4 g/dL — ABNORMAL LOW (ref 13.0–17.0)
MCH: 29.9 pg (ref 26.0–34.0)
MCHC: 33.5 g/dL (ref 30.0–36.0)
MCV: 89.3 fL (ref 78.0–100.0)
PLATELETS: 125 10*3/uL — AB (ref 150–400)
RBC: 2.81 MIL/uL — AB (ref 4.22–5.81)
RDW: 16.8 % — AB (ref 11.5–15.5)
WBC: 8 10*3/uL (ref 4.0–10.5)

## 2016-03-02 LAB — URINE CULTURE: Culture: 1000 — AB

## 2016-03-02 MED ORDER — SPIRONOLACTONE 25 MG PO TABS
25.0000 mg | ORAL_TABLET | Freq: Every day | ORAL | Status: DC
  Administered 2016-03-02 – 2016-03-03 (×2): 25 mg via ORAL
  Filled 2016-03-02 (×2): qty 1

## 2016-03-02 MED ORDER — NADOLOL 20 MG PO TABS
10.0000 mg | ORAL_TABLET | Freq: Every day | ORAL | Status: DC
  Administered 2016-03-02 – 2016-03-03 (×2): 10 mg via ORAL
  Filled 2016-03-02 (×2): qty 1

## 2016-03-02 NOTE — Progress Notes (Signed)
Subjective: Still with headache. No abdominal pain or blood in stool. Is swelling in hands and abdomen.  Objective: Vital signs in last 24 hours: Temp:  [97.4 F (36.3 C)-98.1 F (36.7 C)] 98.1 F (36.7 C) (04/26 0800) Pulse Rate:  [65-91] 84 (04/26 0729) Resp:  [11-39] 15 (04/26 0729) BP: (70-140)/(41-78) 124/78 mmHg (04/26 0729) SpO2:  [93 %-100 %] 100 % (04/26 0729) Weight change:  Last BM Date: 02/28/16  PE: GEN:  NAD EXT:  Scattered ecchymoses, hand bilateral swelling  Lab Results: CBC    Component Value Date/Time   WBC 8.0 03/02/2016 0332   RBC 2.81* 03/02/2016 0332   HGB 8.4* 03/02/2016 0332   HCT 25.1* 03/02/2016 0332   PLT 125* 03/02/2016 0332   MCV 89.3 03/02/2016 0332   MCH 29.9 03/02/2016 0332   MCHC 33.5 03/02/2016 0332   RDW 16.8* 03/02/2016 0332   LYMPHSABS 1.3 02/29/2016 1611   MONOABS 1.1* 02/29/2016 1611   EOSABS 0.0 02/29/2016 1611   BASOSABS 0.0 02/29/2016 1611   CMP     Component Value Date/Time   NA 140 03/02/2016 0332   K 3.8 03/02/2016 0332   CL 114* 03/02/2016 0332   CO2 19* 03/02/2016 0332   GLUCOSE 103* 03/02/2016 0332   BUN 26* 03/02/2016 0332   CREATININE 0.84 03/02/2016 0332   CALCIUM 7.6* 03/02/2016 0332   PROT 6.3* 02/29/2016 1611   ALBUMIN 3.9 02/29/2016 1611   AST 25 02/29/2016 1611   ALT 23 02/29/2016 1611   ALKPHOS 45 02/29/2016 1611   BILITOT 0.8 02/29/2016 1611   GFRNONAA >60 03/02/2016 0332   GFRAA >60 03/02/2016 0332   Assessment:  1. Melena. Old blood (hematin) seen in stomach, but no active source of bleeding identified. No clinical evidence of ongoing active GI tract bleeding. 2. Acute blood loss anemia.  Hgb stable. 3.  Cirrhosis, MELD 9, Child's A/B. 4.  Hepatitis C, eradicated.  Plan:  1.  Advance diet to regular low sodium. 2.  Low-dose spironolactone. 3.  Pantoprazole 40 mg po bid x 6 weeks, then 40 mg po qd until further notice. 4.  If no further bleeding or other significant troubles, could  consider discharge home tomorrow from GI perspective. 5.  [facility] will follow.   Landry Dyke 03/02/2016, 8:31 AM   Pager [phone] If no answer or after 5 PM call [phone]

## 2016-03-02 NOTE — Progress Notes (Addendum)
PROGRESS NOTE    Joshua Burton  [id number] DOB: [birth year] DOA: 02/29/2016 PCP: Gerrit Heck, MD  Outpatient Specialists: Dr. Orvis Brill, [facility] hepatology  Brief Narrative:   65 y.o. ? with a history of cirrhosis secondary to hepatitis C, genotype 1A-Meld Na=9 points   has been treated with sofosbuvir/daclatasvir  24 weeks under care Dr. Marty Heck [facility] gastroenterology Last EGD 11/12/15 = grade 1 varices/gastropathy - One non-bleeding duodenal ulcer with no stigmata of  bleeding. - Normal duodenal bulb. -on chronic diuretics at baseline-Aldactone 100  presented to ED [facility] 4.24.17 melena/ 4-5 stools. Hemoglobin was 8, was orthostatic. -Given 2 Units PRBC on admission, hd EGD last pm per Dr.Hayes-grade 1 varices, no active bleeding noted  Assessment & Plan: Upper Gi Bleed/Melena -EGD 4/24 with Grade 1 varices -change PPI to Q12, stop Octreotide gtt 03/01/16 -Graduated from clear diet to regular low salt diet for 26 2017 -Saline lock IV 03/02/2016  Symptomatic anemia secondary to acute blood loss -see above, s/p 2units PRBCs now  -Repeat labs for 27 2017 hemoglobin ranging between 7 and 8  History of cirrhosis -Continue nadolol with parameters  Hypertension with episodic hypotension after restarting beta blocker and clonidine -Discontinued clonidine this admission -Cut back nadolol 40--> 10 mg daily Cautious restart Aldactone at lower dose of 25 mg (home dose 100 mg)  Elevated troponin of unclear clinical significance --repeat normalized, no chest pain  DVT proph: SCDs  Code Status: FULL Family Communication: Updated patient alone Disposition Plan: Tx to [place] bed, may shower Expected discharge 03/03/2016 if hemodynamically stabilized  Consultants:   Howie Ill  Procedures:  EGD 4/24  Subjective:   Severe headaches overnight better with tramadol, morphine Nursing reports was hypotensive most of the day after restarting other blood pressure  medications Hands, feet revealed swollen No dark stool no tarry stool No vomiting No chest pain No blurred vision No double vision No unilateral weakness Having breakfast at the bedside. Objective: Filed Vitals:   03/02/16 0000 03/02/16 0200 03/02/16 0400 03/02/16 0600  BP: 121/76 122/72 120/78 130/75  Pulse: 70 70 73 72  Temp: 97.4 F (36.3 C)  98 F (36.7 C)   TempSrc: Axillary  Oral   Resp: 21 17 12 23   Height:      Weight:      SpO2: 94% 93% 93% 95%    Intake/Output Summary (Last 24 hours) at 03/02/16 0729 Last data filed at 03/02/16 0600  Gross per 24 hour  Intake 3653.33 ml  Output   2250 ml  Net 1403.33 ml   Filed Weights   02/29/16 1549 02/29/16 1936  Weight: 90.719 kg (200 lb) 90.719 kg (200 lb)    Examination:  General exam: Appears calm and comfortable , no distress Respiratory system: Clear to auscultation. Respiratory effort normal. Cardiovascular system: S1 & S2 heard, RRR. No JVD, murmurs, rubs, gallops or clicks. Trace pedal edema Gastrointestinal system: Abdomen is midly distended-No fluid shift, tympanitic throughout No pain No hepato-splenomegaly Central nervous system: Alert and oriented. No focal neurological deficits. Extremities: Symmetric 5 x 5 power. Skin: No rashes, lesions or ulcers, hands appear swollen  Psychiatry: Judgement and insight appear normal. Mood & affect appropriate.     Data Reviewed: I have personally reviewed following labs and imaging studies  CBC:  Recent Labs Lab 02/29/16 1611 03/01/16 0806 03/01/16 1629 03/02/16 0332  WBC 11.3*  --  6.3 8.0  NEUTROABS 8.8*  --   --   --   HGB 8.0* 8.4* 7.7*  8.4*  HCT 24.1* 24.9* 22.9* 25.1*  MCV 86.4  --  87.4 89.3  PLT 201  --  109* 0000000*   Basic Metabolic Panel:  Recent Labs Lab 02/29/16 1611 03/01/16 0806 03/02/16 0332  NA 139 142 140  K 4.0 3.9 3.8  CL 111 115* 114*  CO2 19* 19* 19*  GLUCOSE 119* 120* 103*  BUN 63* 43* 26*  CREATININE 0.83 0.90 0.84   CALCIUM 8.6* 7.6* 7.6*   GFR: Estimated Creatinine Clearance: 103.8 mL/min (by C-G formula based on Cr of 0.84). Liver Function Tests:  Recent Labs Lab 02/29/16 1611  AST 25  ALT 23  ALKPHOS 45  BILITOT 0.8  PROT 6.3*  ALBUMIN 3.9    Recent Labs Lab 02/29/16 1611  LIPASE 26   No results for input(s): AMMONIA in the last 168 hours. Coagulation Profile:  Recent Labs Lab 02/29/16 1611  INR 1.21   Cardiac Enzymes:  Recent Labs Lab 02/29/16 1611 02/29/16 2120 03/01/16 0806  TROPONINI 0.07* 0.05* 0.03   BNP (last 3 results) No results for input(s): PROBNP in the last 8760 hours. HbA1C: No results for input(s): HGBA1C in the last 72 hours. CBG: No results for input(s): GLUCAP in the last 168 hours. Lipid Profile: No results for input(s): CHOL, HDL, LDLCALC, TRIG, CHOLHDL, LDLDIRECT in the last 72 hours. Thyroid Function Tests: No results for input(s): TSH, T4TOTAL, FREET4, T3FREE, THYROIDAB in the last 72 hours. Anemia Panel: No results for input(s): VITAMINB12, FOLATE, FERRITIN, TIBC, IRON, RETICCTPCT in the last 72 hours. Urine analysis:    Component Value Date/Time   COLORURINE YELLOW 02/29/2016 1607   APPEARANCEUR CLEAR 02/29/2016 1607   LABSPEC 1.018 02/29/2016 1607   PHURINE 6.0 02/29/2016 1607   GLUCOSEU NEGATIVE 02/29/2016 1607   HGBUR NEGATIVE 02/29/2016 1607   BILIRUBINUR NEGATIVE 02/29/2016 1607   KETONESUR NEGATIVE 02/29/2016 1607   PROTEINUR NEGATIVE 02/29/2016 1607   UROBILINOGEN 1.0 08/10/2008 2128   NITRITE NEGATIVE 02/29/2016 1607   LEUKOCYTESUR NEGATIVE 02/29/2016 1607    Recent Results (from the past 240 hour(s))  Urine culture     Status: None (Preliminary result)   Collection Time: 02/29/16  4:07 PM  Result Value Ref Range Status   Specimen Description URINE, CLEAN CATCH  Final   Special Requests NONE  Final   Culture   Final    NO GROWTH < 24 HOURS Performed at [facility]    Report Status PENDING  Incomplete   MRSA PCR Screening     Status: None   Collection Time: 02/29/16  9:59 PM  Result Value Ref Range Status   MRSA by PCR NEGATIVE NEGATIVE Final    Comment:        The GeneXpert MRSA Assay (FDA approved for NASAL specimens only), is one component of a comprehensive MRSA colonization surveillance program. It is not intended to diagnose MRSA infection nor to guide or monitor treatment for MRSA infections.      Radiology Studies: No results found.  Scheduled Meds: . nadolol  10 mg Oral Daily  . pantoprazole  40 mg Oral BID AC  . sodium chloride flush  3 mL Intravenous Q12H  . spironolactone  25 mg Oral Daily   Continuous Infusions:     LOS: 2 days    Time spent: 31min   Verneita Griffes, MD [facility] ([phone]  If 7PM-7AM, please contact night-coverage [email or web address] Password [facility] 03/02/2016, 7:29 AM

## 2016-03-02 NOTE — Progress Notes (Signed)
Report given to Joelene Millin, RN and all questions answered. Pt updated on room change and will notifiy appropriate family members. All belongings including phone and phone charger sent with patient.

## 2016-03-03 LAB — COMPREHENSIVE METABOLIC PANEL
ALK PHOS: 51 U/L (ref 38–126)
ALT: 21 U/L (ref 17–63)
AST: 33 U/L (ref 15–41)
Albumin: 3.4 g/dL — ABNORMAL LOW (ref 3.5–5.0)
Anion gap: 8 (ref 5–15)
BUN: 16 mg/dL (ref 6–20)
CALCIUM: 8.2 mg/dL — AB (ref 8.9–10.3)
CO2: 21 mmol/L — AB (ref 22–32)
CREATININE: 0.8 mg/dL (ref 0.61–1.24)
Chloride: 110 mmol/L (ref 101–111)
Glucose, Bld: 103 mg/dL — ABNORMAL HIGH (ref 65–99)
Potassium: 3.9 mmol/L (ref 3.5–5.1)
Sodium: 139 mmol/L (ref 135–145)
Total Bilirubin: 0.6 mg/dL (ref 0.3–1.2)
Total Protein: 5.9 g/dL — ABNORMAL LOW (ref 6.5–8.1)

## 2016-03-03 LAB — PROTIME-INR
INR: 1.14 (ref 0.00–1.49)
Prothrombin Time: 14.8 seconds (ref 11.6–15.2)

## 2016-03-03 LAB — CBC WITH DIFFERENTIAL/PLATELET
Basophils Absolute: 0.1 10*3/uL (ref 0.0–0.1)
Basophils Relative: 1 %
Eosinophils Absolute: 0.7 10*3/uL (ref 0.0–0.7)
Eosinophils Relative: 9 %
HEMATOCRIT: 25.8 % — AB (ref 39.0–52.0)
Hemoglobin: 8.4 g/dL — ABNORMAL LOW (ref 13.0–17.0)
LYMPHS PCT: 22 %
Lymphs Abs: 1.7 10*3/uL (ref 0.7–4.0)
MCH: 28.8 pg (ref 26.0–34.0)
MCHC: 32.6 g/dL (ref 30.0–36.0)
MCV: 88.4 fL (ref 78.0–100.0)
MONO ABS: 0.8 10*3/uL (ref 0.1–1.0)
MONOS PCT: 10 %
Neutro Abs: 4.7 10*3/uL (ref 1.7–7.7)
Neutrophils Relative %: 58 %
Platelets: 126 10*3/uL — ABNORMAL LOW (ref 150–400)
RBC: 2.92 MIL/uL — ABNORMAL LOW (ref 4.22–5.81)
RDW: 16.5 % — AB (ref 11.5–15.5)
WBC: 8 10*3/uL (ref 4.0–10.5)

## 2016-03-03 MED ORDER — NADOLOL 20 MG PO TABS: 10.0000 mg | ORAL_TABLET | Freq: Every day | ORAL | Status: DC

## 2016-03-03 MED ORDER — SPIRONOLACTONE 25 MG PO TABS: 25.0000 mg | ORAL_TABLET | Freq: Every day | ORAL | Status: DC

## 2016-03-03 MED ORDER — TRAMADOL HCL 50 MG PO TABS: 50.0000 mg | ORAL_TABLET | Freq: Four times a day (QID) | ORAL | Status: DC | PRN

## 2016-03-03 MED ORDER — PANTOPRAZOLE SODIUM 40 MG PO TBEC: 40.0000 mg | DELAYED_RELEASE_TABLET | Freq: Two times a day (BID) | ORAL | Status: DC

## 2016-03-03 NOTE — Care Management Note (Signed)
Case Management Note  Patient Details  Name: [name] MRN: [id number] Date of Birth: 1951-03-23  Subjective/Objective:      Acute blood loss anemia              Action/Plan: Discharge Planning: AVS reviewed:   PCP : [name]  MD NCM spoke to pt at bedside. No NCM needs identified. States he can afford his medication.    Expected Discharge Date:  03/03/2016           Expected Discharge Plan:  Home/Self Care  In-House Referral:  NA  Discharge planning Services  CM Consult  Post Acute Care Choice:  NA Choice offered to:  NA  DME Arranged:  N/A DME Agency:  NA  HH Arranged:  NA HH Agency:  NA  Status of Service:  Completed, signed off  Medicare Important Message Given:  Yes Date Medicare IM Given:    Medicare IM give by:    Date Additional Medicare IM Given:    Additional Medicare Important Message give by:     If discussed at [place] of Stay Meetings, dates discussed:    Additional Comments:  Erenest Rasher, RN 03/03/2016, 10:12 AM

## 2016-03-03 NOTE — Discharge Summary (Signed)
Physician Discharge Summary  Joshua Burton [id number] DOB: [birth year] DOA: 02/29/2016  PCP: Gerrit Heck, MD  Admit date: 02/29/2016 Discharge date: 03/03/2016  Time spent: 40 minutes  Recommendations for Outpatient Follow-up:  1. Recommend outpatient titration of pain meds/tramadol started this hospitalization as was on mobic prior to admission for osteoarthritis of the hip--I have cautioned him with regards to the use of Mobic and cautioned him not to use anything stronger than Naprosyn if needed for pain 2.  we have also doubled his home dose of pantoprazole to 40 twice a day 3. please note dosage change of not all of which may need to be uptitrated back to 40 mg daily from 10 mg and he was discharged on. 4. Recommend outpatient CBC, complete metabolic panel as per PCP in about 3-5 days as dosages have changed of his Aldactone 5. Recommend PCP office appointment in about 2 weeks 6. Recommend either [facility] [place] or equal GI doctor his see the patient within a month or 2 7. Recommend soft diet 8. Recommend outpatient orthopedics reevaluation of his left hip-has been putting off surgery  Discharge Diagnoses:  Principal Problem:   Acute blood loss anemia Active Problems:   Thrombocytopenia (HCC)   Upper GI bleed   Cirrhosis ([place])   Portal hypertension (HCC)   Esophageal varices (HCC)   Acute upper GI bleed   Discharge Condition: Fair   Diet recommendation: Heart healthy low-salt t healhty low salt  Filed Weights   02/29/16 1549 02/29/16 1936 03/02/16 1148  Weight: 90.719 kg (200 lb) 90.719 kg (200 lb) 91.6 kg (201 lb 15.1 oz)    History of present illness:  65 y.o. ? with a history of cirrhosis secondary to hepatitis C, genotype 1A-Meld Na=9 points  has been treated with sofosbuvir/daclatasvir 24 weeks under care Dr. Marty Heck [facility] gastroenterology Last EGD 11/12/15 = grade 1 varices/gastropathy - One non-bleeding duodenal ulcer with no  stigmata of  bleeding. - Normal duodenal bulb. -on chronic diuretics at baseline-Aldactone 100  presented to ED [facility] 4.24.17 melena/ 4-5 stools. Hemoglobin was 8, was orthostatic. -Given 2 Units PRBC on admission, hd EGD last pm per Dr.Hayes-grade 1 varices, no active bleeding noted  Hospital Course:   Upper Gi Bleed/Melena -EGD 4/24 with Grade 1 varices -change PPI to Q12, stop Octreotide gtt 03/01/16 -Graduated from clear diet to regular low salt diet for 26 2017 -Saline lock IV 03/02/2016  Symptomatic anemia secondary to acute blood loss -see above, s/p 2units PRBCs now  -Repeat labs for 27 2017 hemoglobin ranging between 7 and 8  History of cirrhosis -Continue nadolol with parameters as below -might need screening for varices as per [facility] Dr. Patsy [place] [facility] Dr. Amedeo Plenty  Hypertension with episodic hypotension after restarting beta blocker and clonidine -Discontinued clonidine this admission -Cut back nadolol 40--> 10 mg daily Cautious restart Aldactone at lower dose of 25 mg (home dose 100 mg)  Elevated troponin of unclear clinical significance --repeat normalized, no chest pain  Consultants:   [facility]  Procedures:  EGD 4/24  Discharge Exam: Filed Vitals:   03/02/16 2101 03/03/16 0510  BP: 156/80 111/63  Pulse: 76 73  Temp: 97.6 F (36.4 C) 98.1 F (36.7 C)  Resp: 18 16    General exam: Appears calm and comfortable , no distress Respiratory system: Clear to auscultation. Respiratory effort normal. Cardiovascular system: S1 & S2 heard, RRR. No JVD, murmurs, rubs, gallops or clicks. Trace pedal edema Gastrointestinal system: Abdomen is  midly distended-No fluid shift, tympanitic throughout No pain No hepato-splenomegaly Central nervous system: Alert and oriented. No focal neurological deficits  Discharge Instructions   Discharge Instructions    Diet - low sodium heart healthy    Complete by:  As directed      Discharge instructions    Complete by:  As  directed   Try to cut back meloxicam-this may have been one of the reasons why you might have had some stomach bleeding I have discontinued clonidine. Have adjusted downward you dose of Aldactone. get labs in 3-6 days at either PCP or GI office Recommend you follow with PCP ~ 2 weeks     Increase activity slowly    Complete by:  As directed           Current Discharge Medication List    START taking these medications   Details  traMADol (ULTRAM) 50 MG tablet Take 1 tablet (50 mg total) by mouth every 6 (six) hours as needed for severe pain. Qty: 30 tablet, Refills: 0      CONTINUE these medications which have CHANGED   Details  nadolol (CORGARD) 20 MG tablet Take 0.5 tablets (10 mg total) by mouth daily. Qty: 30 tablet, Refills: 0    pantoprazole (PROTONIX) 40 MG tablet Take 1 tablet (40 mg total) by mouth 2 (two) times daily. Qty: 60 tablet, Refills: 5    spironolactone (ALDACTONE) 25 MG tablet Take 1 tablet (25 mg total) by mouth daily. Qty: 30 tablet, Refills: 0      CONTINUE these medications which have NOT CHANGED   Details  fluocinonide cream (LIDEX) [id number] % Apply 1 application topically 2 (two) times daily as needed. *Not to face, groin, or axilla.* Refills: 3    meloxicam (MOBIC) 15 MG tablet Take 15 mg by mouth daily as needed for pain.  Refills: 2      STOP taking these medications     cloNIDine (CATAPRES) 0.2 MG tablet        No Known Allergies    The results of significant diagnostics from this hospitalization (including imaging, microbiology, ancillary and laboratory) are listed below for reference.    Significant Diagnostic Studies: No results found.  Microbiology: Recent Results (from the past 240 hour(s))  Urine culture     Status: Abnormal   Collection Time: 02/29/16  4:07 PM  Result Value Ref Range Status   Specimen Description URINE, CLEAN CATCH  Final   Special Requests NONE  Final   Culture (A)  Final    1,000 COLONIES/mL  INSIGNIFICANT GROWTH Performed at [facility]    Report Status 03/02/2016 FINAL  Final  MRSA PCR Screening     Status: None   Collection Time: 02/29/16  9:59 PM  Result Value Ref Range Status   MRSA by PCR NEGATIVE NEGATIVE Final    Comment:        The GeneXpert MRSA Assay (FDA approved for NASAL specimens only), is one component of a comprehensive MRSA colonization surveillance program. It is not intended to diagnose MRSA infection nor to guide or monitor treatment for MRSA infections.      Labs: Basic Metabolic Panel:  Recent Labs Lab 02/29/16 1611 03/01/16 0806 03/02/16 0332 03/03/16 0347  NA 139 142 140 139  K 4.0 3.9 3.8 3.9  CL 111 115* 114* 110  CO2 19* 19* 19* 21*  GLUCOSE 119* 120* 103* 103*  BUN 63* 43* 26* 16  CREATININE 0.83 0.90 0.84 0.80  CALCIUM  8.6* 7.6* 7.6* 8.2*   Liver Function Tests:  Recent Labs Lab 02/29/16 1611 03/03/16 0347  AST 25 33  ALT 23 21  ALKPHOS 45 51  BILITOT 0.8 PENDING  PROT 6.3* 5.9*  ALBUMIN 3.9 3.4*    Recent Labs Lab 02/29/16 1611  LIPASE 26   No results for input(s): AMMONIA in the last 168 hours. CBC:  Recent Labs Lab 02/29/16 1611 03/01/16 0806 03/01/16 1629 03/02/16 0332 03/03/16 0347  WBC 11.3*  --  6.3 8.0 8.0  NEUTROABS 8.8*  --   --   --  4.7  HGB 8.0* 8.4* 7.7* 8.4* 8.4*  HCT 24.1* 24.9* 22.9* 25.1* 25.8*  MCV 86.4  --  87.4 89.3 88.4  PLT 201  --  109* 125* 126*   Cardiac Enzymes:  Recent Labs Lab 02/29/16 1611 02/29/16 2120 03/01/16 0806  TROPONINI 0.07* 0.05* 0.03   BNP: BNP (last 3 results) No results for input(s): BNP in the last 8760 hours.  ProBNP (last 3 results) No results for input(s): PROBNP in the last 8760 hours.  CBG: No results for input(s): GLUCAP in the last 168 hours.     SignedNita Sells MD   [facility] 03/03/2016, 8:42 AM

## 2016-03-03 NOTE — Progress Notes (Signed)
Pt discharged home in stable condition. Discharge instructions and scripts given. Pt verbalized understanding 

## 2016-03-03 NOTE — Care Management Important Message (Signed)
Important Message  Patient Details  Name: Joshua Burton MRN: [id number] Date of Birth: 1950-11-28   Medicare Important Message Given:  Yes    Erenest Rasher, RN 03/03/2016, 10:07 AM

## 2016-03-03 NOTE — Progress Notes (Signed)
Subjective: No blood in stool. No abdominal pain. Ready to go home.  Objective: Vital signs in last 24 hours: Temp:  [97.5 F (36.4 C)-98.2 F (36.8 C)] 98.1 F (36.7 C) (04/27 0510) Pulse Rate:  [71-90] 73 (04/27 0510) Resp:  [16-18] 16 (04/27 0510) BP: (111-156)/(63-82) 111/63 mmHg (04/27 0510) SpO2:  [100 %] 100 % (04/27 0510) Weight:  [91.6 kg (201 lb 15.1 oz)] 91.6 kg (201 lb 15.1 oz) (04/26 1148) Weight change:  Last BM Date: 03/01/16  PE: GEN:  NAD  Lab Results: CBC    Component Value Date/Time   WBC 8.0 03/03/2016 0347   RBC 2.92* 03/03/2016 0347   HGB 8.4* 03/03/2016 0347   HCT 25.8* 03/03/2016 0347   PLT 126* 03/03/2016 0347   MCV 88.4 03/03/2016 0347   MCH 28.8 03/03/2016 0347   MCHC 32.6 03/03/2016 0347   RDW 16.5* 03/03/2016 0347   LYMPHSABS 1.7 03/03/2016 0347   MONOABS 0.8 03/03/2016 0347   EOSABS 0.7 03/03/2016 0347   BASOSABS 0.1 03/03/2016 0347   CMP     Component Value Date/Time   NA 139 03/03/2016 0347   K 3.9 03/03/2016 0347   CL 110 03/03/2016 0347   CO2 21* 03/03/2016 0347   GLUCOSE 103* 03/03/2016 0347   BUN 16 03/03/2016 0347   CREATININE 0.80 03/03/2016 0347   CALCIUM 8.2* 03/03/2016 0347   PROT 5.9* 03/03/2016 0347   ALBUMIN 3.4* 03/03/2016 0347   AST 33 03/03/2016 0347   ALT 21 03/03/2016 0347   ALKPHOS 51 03/03/2016 0347   BILITOT PENDING 03/03/2016 0347   GFRNONAA >60 03/03/2016 0347   GFRAA >60 03/03/2016 0347   Assessment:  1. Melena. Old blood (hematin) seen in stomach, but no active source of bleeding identified. No clinical evidence of ongoing active GI tract bleeding. 2. Acute blood loss anemia. Hgb stable. 3. Cirrhosis, MELD 9, Child's A/B. 4. Hepatitis C, eradicated.  Plan:  1.  Pantoprazole 40 mg po bid x 6 weeks, then 40 mg po qd thereafter until further notice. 2.  Low sodium diet. 3.  Low-dose spironolactone. 4.  OK to discharge home today; he will follow-up with [facility] [phone]) as well as  Dr. Patsy [place]. 5.  Will sign-off; please call with questions; thank you for the consultation.   Landry Dyke 03/03/2016, 8:48 AM   Pager [phone] If no answer or after 5 PM call [phone]

## 2016-03-04 LAB — TYPE AND SCREEN
ABO/RH(D): B POS
Antibody Screen: NEGATIVE
DONOR AG TYPE: NEGATIVE
DONOR AG TYPE: NEGATIVE
DONOR AG TYPE: NEGATIVE
DONOR AG TYPE: NEGATIVE
UNIT DIVISION: 0
Unit division: 0
Unit division: 0
Unit division: 0

## 2016-03-18 DIAGNOSIS — K922 Gastrointestinal hemorrhage, unspecified: Secondary | ICD-10-CM | POA: Diagnosis not present

## 2016-03-18 DIAGNOSIS — Z79899 Other long term (current) drug therapy: Secondary | ICD-10-CM | POA: Diagnosis not present

## 2016-03-18 DIAGNOSIS — D500 Iron deficiency anemia secondary to blood loss (chronic): Secondary | ICD-10-CM | POA: Diagnosis not present

## 2016-03-21 DIAGNOSIS — D500 Iron deficiency anemia secondary to blood loss (chronic): Secondary | ICD-10-CM | POA: Diagnosis not present

## 2016-03-21 DIAGNOSIS — E871 Hypo-osmolality and hyponatremia: Secondary | ICD-10-CM | POA: Diagnosis not present

## 2016-04-08 DIAGNOSIS — D649 Anemia, unspecified: Secondary | ICD-10-CM | POA: Diagnosis not present

## 2016-04-11 DIAGNOSIS — D649 Anemia, unspecified: Secondary | ICD-10-CM | POA: Diagnosis not present

## 2016-04-11 DIAGNOSIS — K7460 Unspecified cirrhosis of liver: Secondary | ICD-10-CM | POA: Diagnosis not present

## 2016-04-11 DIAGNOSIS — K922 Gastrointestinal hemorrhage, unspecified: Secondary | ICD-10-CM | POA: Diagnosis not present

## 2016-04-11 DIAGNOSIS — Z1211 Encounter for screening for malignant neoplasm of colon: Secondary | ICD-10-CM | POA: Diagnosis not present

## 2016-04-11 DIAGNOSIS — B1920 Unspecified viral hepatitis C without hepatic coma: Secondary | ICD-10-CM | POA: Diagnosis not present

## 2016-09-16 ENCOUNTER — Encounter (HOSPITAL_COMMUNITY): Payer: Self-pay | Admitting: Emergency Medicine

## 2016-09-16 ENCOUNTER — Emergency Department (HOSPITAL_COMMUNITY)
Admission: EM | Admit: 2016-09-16 | Discharge: 2016-09-16 | Disposition: A | Discharge status: Home / Self Care | Payer: Commercial Managed Care - HMO | Attending: Emergency Medicine | Admitting: Emergency Medicine

## 2016-09-16 ENCOUNTER — Emergency Department (HOSPITAL_COMMUNITY): Payer: Commercial Managed Care - HMO

## 2016-09-16 DIAGNOSIS — S79911A Unspecified injury of right hip, initial encounter: Secondary | ICD-10-CM | POA: Diagnosis present

## 2016-09-16 DIAGNOSIS — I10 Essential (primary) hypertension: Secondary | ICD-10-CM | POA: Diagnosis not present

## 2016-09-16 DIAGNOSIS — Z791 Long term (current) use of non-steroidal anti-inflammatories (NSAID): Secondary | ICD-10-CM | POA: Insufficient documentation

## 2016-09-16 DIAGNOSIS — Z79899 Other long term (current) drug therapy: Secondary | ICD-10-CM | POA: Diagnosis not present

## 2016-09-16 DIAGNOSIS — Z01818 Encounter for other preprocedural examination: Secondary | ICD-10-CM | POA: Diagnosis not present

## 2016-09-16 DIAGNOSIS — Y939 Activity, unspecified: Secondary | ICD-10-CM | POA: Insufficient documentation

## 2016-09-16 DIAGNOSIS — Y999 Unspecified external cause status: Secondary | ICD-10-CM | POA: Diagnosis not present

## 2016-09-16 DIAGNOSIS — Y92410 Unspecified street and highway as the place of occurrence of the external cause: Secondary | ICD-10-CM | POA: Diagnosis not present

## 2016-09-16 DIAGNOSIS — S72102A Unspecified trochanteric fracture of left femur, initial encounter for closed fracture: Secondary | ICD-10-CM

## 2016-09-16 DIAGNOSIS — S72114A Nondisplaced fracture of greater trochanter of right femur, initial encounter for closed fracture: Secondary | ICD-10-CM | POA: Diagnosis not present

## 2016-09-16 DIAGNOSIS — S7224XA Nondisplaced subtrochanteric fracture of right femur, initial encounter for closed fracture: Secondary | ICD-10-CM | POA: Diagnosis not present

## 2016-09-16 LAB — CBC WITH DIFFERENTIAL/PLATELET
BASOS ABS: 0.1 10*3/uL (ref 0.0–0.1)
BASOS PCT: 1 %
Eosinophils Absolute: 0.6 10*3/uL (ref 0.0–0.7)
Eosinophils Relative: 8 %
HEMATOCRIT: 34.8 % — AB (ref 39.0–52.0)
Hemoglobin: 11.8 g/dL — ABNORMAL LOW (ref 13.0–17.0)
Lymphocytes Relative: 25 %
Lymphs Abs: 1.8 10*3/uL (ref 0.7–4.0)
MCH: 27.8 pg (ref 26.0–34.0)
MCHC: 33.9 g/dL (ref 30.0–36.0)
MCV: 82.1 fL (ref 78.0–100.0)
MONO ABS: 0.6 10*3/uL (ref 0.1–1.0)
Monocytes Relative: 9 %
NEUTROS ABS: 4.2 10*3/uL (ref 1.7–7.7)
NEUTROS PCT: 57 %
Platelets: 145 10*3/uL — ABNORMAL LOW (ref 150–400)
RBC: 4.24 MIL/uL (ref 4.22–5.81)
RDW: 16.8 % — AB (ref 11.5–15.5)
WBC: 7.3 10*3/uL (ref 4.0–10.5)

## 2016-09-16 LAB — BASIC METABOLIC PANEL
ANION GAP: 10 (ref 5–15)
BUN: 12 mg/dL (ref 6–20)
CALCIUM: 9.1 mg/dL (ref 8.9–10.3)
CO2: 21 mmol/L — AB (ref 22–32)
Chloride: 100 mmol/L — ABNORMAL LOW (ref 101–111)
Creatinine, Ser: 0.81 mg/dL (ref 0.61–1.24)
Glucose, Bld: 96 mg/dL (ref 65–99)
Potassium: 4 mmol/L (ref 3.5–5.1)
SODIUM: 131 mmol/L — AB (ref 135–145)

## 2016-09-16 LAB — URINALYSIS, ROUTINE W REFLEX MICROSCOPIC
BILIRUBIN URINE: NEGATIVE
Glucose, UA: NEGATIVE mg/dL
HGB URINE DIPSTICK: NEGATIVE
KETONES UR: NEGATIVE mg/dL
Leukocytes, UA: NEGATIVE
Nitrite: NEGATIVE
PROTEIN: NEGATIVE mg/dL
Specific Gravity, Urine: 1.007 (ref 1.005–1.030)
pH: 6.5 (ref 5.0–8.0)

## 2016-09-16 LAB — TYPE AND SCREEN
ABO/RH(D): B POS
Antibody Screen: NEGATIVE

## 2016-09-16 LAB — PROTIME-INR
INR: 1.01
Prothrombin Time: 13.3 seconds (ref 11.4–15.2)

## 2016-09-16 MED ORDER — OXYCODONE-ACETAMINOPHEN 5-325 MG PO TABS
1.0000 | ORAL_TABLET | ORAL | 0 refills | Status: DC | PRN
Start: 2016-09-16 — End: 2017-06-22

## 2016-09-16 MED ORDER — HYDROMORPHONE HCL 1 MG/ML IJ SOLN
1.0000 mg | Freq: Once | INTRAMUSCULAR | Status: AC
  Administered 2016-09-16: 1 mg via INTRAVENOUS
  Filled 2016-09-16: qty 1

## 2016-09-16 MED ORDER — FENTANYL CITRATE (PF) 100 MCG/2ML IJ SOLN
100.0000 ug | INTRAMUSCULAR | Status: DC | PRN
  Administered 2016-09-16 (×2): 100 ug via INTRAVENOUS
  Filled 2016-09-16 (×2): qty 2

## 2016-09-16 MED ORDER — DIAZEPAM 5 MG/ML IJ SOLN
5.0000 mg | Freq: Once | INTRAMUSCULAR | Status: AC
  Administered 2016-09-16: 5 mg via INTRAVENOUS
  Filled 2016-09-16: qty 2
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
## 2016-09-16 NOTE — ED Notes (Signed)
Pt states she would like something for pain. Primary nurse notified

## 2016-09-16 NOTE — Discharge Instructions (Signed)
You have a stable femur fracture. Please see the Orthopedist in 1 week. Weight bearing with crutches or walker allowed. Take THE LOWEST PAIN MEDS you can tolerate, so that you dont fall whilst walking.

## 2016-09-16 NOTE — ED Notes (Signed)
Bed: [id number] Expected date:  Expected time:  Means of arrival:  Comments:  hip injury
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
## 2016-09-16 NOTE — ED Notes (Signed)
Patient aware that a urine sample is needed. Urinal is at the bedside.  

## 2016-09-16 NOTE — ED Notes (Signed)
Pt has urinal at bedside but states he unable to give a urine sample.

## 2016-09-22 ENCOUNTER — Encounter (INDEPENDENT_AMBULATORY_CARE_PROVIDER_SITE_OTHER): Payer: Self-pay | Admitting: Orthopaedic Surgery

## 2016-09-22 ENCOUNTER — Ambulatory Visit (INDEPENDENT_AMBULATORY_CARE_PROVIDER_SITE_OTHER): Payer: Commercial Managed Care - HMO

## 2016-09-22 ENCOUNTER — Ambulatory Visit (INDEPENDENT_AMBULATORY_CARE_PROVIDER_SITE_OTHER): Payer: Commercial Managed Care - HMO | Admitting: Orthopaedic Surgery

## 2016-09-22 DIAGNOSIS — M25551 Pain in right hip: Secondary | ICD-10-CM

## 2016-09-22 DIAGNOSIS — S72114A Nondisplaced fracture of greater trochanter of right femur, initial encounter for closed fracture: Secondary | ICD-10-CM | POA: Diagnosis not present
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
## 2016-10-21 ENCOUNTER — Ambulatory Visit (INDEPENDENT_AMBULATORY_CARE_PROVIDER_SITE_OTHER): Payer: Self-pay

## 2016-10-21 ENCOUNTER — Ambulatory Visit (INDEPENDENT_AMBULATORY_CARE_PROVIDER_SITE_OTHER): Payer: Commercial Managed Care - HMO | Admitting: Orthopaedic Surgery

## 2016-10-21 ENCOUNTER — Encounter (INDEPENDENT_AMBULATORY_CARE_PROVIDER_SITE_OTHER): Payer: Self-pay | Admitting: Orthopaedic Surgery

## 2016-10-21 ENCOUNTER — Ambulatory Visit (INDEPENDENT_AMBULATORY_CARE_PROVIDER_SITE_OTHER): Payer: Commercial Managed Care - HMO

## 2016-10-21 DIAGNOSIS — S72114A Nondisplaced fracture of greater trochanter of right femur, initial encounter for closed fracture: Secondary | ICD-10-CM

## 2016-10-21 NOTE — Progress Notes (Signed)
Patient is 4-5 weeks s/p nondisplaced greater trochanter hip fx around right THA.  He does have some popping in his right knee and thigh soreness.  Ambulating with cane.  No reproducible pain in the thigh with ROM and palpation.  Xrays show a stable fracture and prosthesis.  Continue to increase activity as tolerated.  Fracture is stable.  F/u prn.

## 2016-10-23 DIAGNOSIS — L03115 Cellulitis of right lower limb: Secondary | ICD-10-CM | POA: Diagnosis not present

## 2016-11-10 ENCOUNTER — Other Ambulatory Visit: Payer: Self-pay | Admitting: Gastroenterology

## 2016-11-10 DIAGNOSIS — Z1211 Encounter for screening for malignant neoplasm of colon: Secondary | ICD-10-CM | POA: Diagnosis not present

## 2016-11-10 DIAGNOSIS — K7460 Unspecified cirrhosis of liver: Secondary | ICD-10-CM | POA: Diagnosis not present

## 2016-11-10 DIAGNOSIS — K922 Gastrointestinal hemorrhage, unspecified: Secondary | ICD-10-CM | POA: Diagnosis not present

## 2016-11-10 DIAGNOSIS — B1920 Unspecified viral hepatitis C without hepatic coma: Secondary | ICD-10-CM | POA: Diagnosis not present

## 2016-11-18 ENCOUNTER — Other Ambulatory Visit: Payer: Commercial Managed Care - HMO

## 2016-11-23 ENCOUNTER — Other Ambulatory Visit: Payer: Commercial Managed Care - HMO

## 2016-11-29 ENCOUNTER — Other Ambulatory Visit: Payer: Commercial Managed Care - HMO

## 2016-12-01 DIAGNOSIS — H18413 Arcus senilis, bilateral: Secondary | ICD-10-CM | POA: Diagnosis not present

## 2016-12-02 ENCOUNTER — Ambulatory Visit
Admission: RE | Admit: 2016-12-02 | Discharge: 2016-12-02 | Disposition: A | Discharge status: Home / Self Care | Payer: Commercial Managed Care - HMO | Source: Ambulatory Visit | Attending: Gastroenterology | Admitting: Gastroenterology

## 2016-12-02 DIAGNOSIS — K7460 Unspecified cirrhosis of liver: Secondary | ICD-10-CM | POA: Diagnosis not present

## 2017-01-10 DIAGNOSIS — L570 Actinic keratosis: Secondary | ICD-10-CM | POA: Diagnosis not present

## 2017-01-10 DIAGNOSIS — X32XXXD Exposure to sunlight, subsequent encounter: Secondary | ICD-10-CM | POA: Diagnosis not present

## 2017-02-01 DIAGNOSIS — H2513 Age-related nuclear cataract, bilateral: Secondary | ICD-10-CM | POA: Diagnosis not present

## 2017-02-01 DIAGNOSIS — H18413 Arcus senilis, bilateral: Secondary | ICD-10-CM | POA: Diagnosis not present

## 2017-02-01 DIAGNOSIS — H2512 Age-related nuclear cataract, left eye: Secondary | ICD-10-CM | POA: Diagnosis not present

## 2017-02-28 DIAGNOSIS — H2512 Age-related nuclear cataract, left eye: Secondary | ICD-10-CM | POA: Diagnosis not present

## 2017-03-01 DIAGNOSIS — H2511 Age-related nuclear cataract, right eye: Secondary | ICD-10-CM | POA: Diagnosis not present

## 2017-03-07 DIAGNOSIS — H2511 Age-related nuclear cataract, right eye: Secondary | ICD-10-CM | POA: Diagnosis not present

## 2017-04-20 DIAGNOSIS — Z803 Family history of malignant neoplasm of breast: Secondary | ICD-10-CM | POA: Diagnosis not present

## 2017-04-20 DIAGNOSIS — Z862 Personal history of diseases of the blood and blood-forming organs and certain disorders involving the immune mechanism: Secondary | ICD-10-CM | POA: Diagnosis not present

## 2017-04-20 DIAGNOSIS — Z79899 Other long term (current) drug therapy: Secondary | ICD-10-CM | POA: Diagnosis not present

## 2017-04-20 DIAGNOSIS — I10 Essential (primary) hypertension: Secondary | ICD-10-CM | POA: Diagnosis not present

## 2017-04-20 DIAGNOSIS — Z8619 Personal history of other infectious and parasitic diseases: Secondary | ICD-10-CM | POA: Diagnosis not present

## 2017-04-20 DIAGNOSIS — N62 Hypertrophy of breast: Secondary | ICD-10-CM | POA: Diagnosis not present

## 2017-04-21 ENCOUNTER — Other Ambulatory Visit: Payer: Self-pay | Admitting: Family Medicine

## 2017-04-21 DIAGNOSIS — Z803 Family history of malignant neoplasm of breast: Secondary | ICD-10-CM

## 2017-04-21 DIAGNOSIS — N62 Hypertrophy of breast: Secondary | ICD-10-CM

## 2017-04-26 ENCOUNTER — Ambulatory Visit
Admission: RE | Admit: 2017-04-26 | Discharge: 2017-04-26 | Disposition: A | Discharge status: Home / Self Care | Payer: Commercial Managed Care - HMO | Source: Ambulatory Visit | Attending: Family Medicine | Admitting: Family Medicine

## 2017-04-26 ENCOUNTER — Other Ambulatory Visit: Payer: Commercial Managed Care - HMO

## 2017-04-26 DIAGNOSIS — Z803 Family history of malignant neoplasm of breast: Secondary | ICD-10-CM

## 2017-04-26 DIAGNOSIS — R928 Other abnormal and inconclusive findings on diagnostic imaging of breast: Secondary | ICD-10-CM | POA: Diagnosis not present

## 2017-04-26 DIAGNOSIS — N62 Hypertrophy of breast: Secondary | ICD-10-CM

## 2017-05-22 ENCOUNTER — Other Ambulatory Visit: Payer: Self-pay | Admitting: Gastroenterology

## 2017-05-22 DIAGNOSIS — K7469 Other cirrhosis of liver: Secondary | ICD-10-CM

## 2017-05-29 DIAGNOSIS — Z79899 Other long term (current) drug therapy: Secondary | ICD-10-CM | POA: Diagnosis not present

## 2017-05-29 DIAGNOSIS — R51 Headache: Secondary | ICD-10-CM | POA: Diagnosis not present

## 2017-05-29 DIAGNOSIS — I10 Essential (primary) hypertension: Secondary | ICD-10-CM | POA: Insufficient documentation

## 2017-05-30 ENCOUNTER — Emergency Department (HOSPITAL_COMMUNITY): Payer: Medicare HMO

## 2017-05-30 ENCOUNTER — Other Ambulatory Visit: Payer: Commercial Managed Care - HMO

## 2017-05-30 ENCOUNTER — Encounter (HOSPITAL_COMMUNITY): Payer: Self-pay | Admitting: Emergency Medicine

## 2017-05-30 ENCOUNTER — Emergency Department (HOSPITAL_COMMUNITY)
Admission: EM | Admit: 2017-05-30 | Discharge: 2017-05-30 | Disposition: A | Discharge status: Home / Self Care | Payer: Medicare HMO | Attending: Emergency Medicine | Admitting: Emergency Medicine

## 2017-05-30 DIAGNOSIS — R51 Headache: Secondary | ICD-10-CM | POA: Diagnosis not present

## 2017-05-30 DIAGNOSIS — I10 Essential (primary) hypertension: Secondary | ICD-10-CM

## 2017-05-30 LAB — I-STAT CHEM 8, ED
BUN: 7 mg/dL (ref 6–20)
CALCIUM ION: 0.98 mmol/L — AB (ref 1.15–1.40)
CREATININE: 0.8 mg/dL (ref 0.61–1.24)
Chloride: 102 mmol/L (ref 101–111)
Glucose, Bld: 97 mg/dL (ref 65–99)
HEMATOCRIT: 44 % (ref 39.0–52.0)
Hemoglobin: 15 g/dL (ref 13.0–17.0)
Potassium: 3.6 mmol/L (ref 3.5–5.1)
Sodium: 138 mmol/L (ref 135–145)
TCO2: 24 mmol/L (ref 0–100)

## 2017-05-30 LAB — CBC WITH DIFFERENTIAL/PLATELET
Basophils Absolute: 0.1 10*3/uL (ref 0.0–0.1)
Basophils Relative: 1 %
Eosinophils Absolute: 0.6 10*3/uL (ref 0.0–0.7)
Eosinophils Relative: 9 %
HEMATOCRIT: 41.4 % (ref 39.0–52.0)
HEMOGLOBIN: 14.3 g/dL (ref 13.0–17.0)
LYMPHS ABS: 1.7 10*3/uL (ref 0.7–4.0)
Lymphocytes Relative: 25 %
MCH: 32.1 pg (ref 26.0–34.0)
MCHC: 34.5 g/dL (ref 30.0–36.0)
MCV: 93 fL (ref 78.0–100.0)
MONOS PCT: 14 %
Monocytes Absolute: 1 10*3/uL (ref 0.1–1.0)
NEUTROS ABS: 3.6 10*3/uL (ref 1.7–7.7)
NEUTROS PCT: 51 %
Platelets: 132 10*3/uL — ABNORMAL LOW (ref 150–400)
RBC: 4.45 MIL/uL (ref 4.22–5.81)
RDW: 15 % (ref 11.5–15.5)
WBC: 7 10*3/uL (ref 4.0–10.5)

## 2017-05-30 LAB — URINALYSIS, ROUTINE W REFLEX MICROSCOPIC
Bacteria, UA: NONE SEEN
Bilirubin Urine: NEGATIVE
Glucose, UA: NEGATIVE mg/dL
HGB URINE DIPSTICK: NEGATIVE
KETONES UR: NEGATIVE mg/dL
LEUKOCYTES UA: NEGATIVE
Nitrite: NEGATIVE
PROTEIN: NEGATIVE mg/dL
Specific Gravity, Urine: 1.002 — ABNORMAL LOW (ref 1.005–1.030)
Squamous Epithelial / LPF: NONE SEEN
pH: 7 (ref 5.0–8.0)

## 2017-05-30 LAB — RAPID URINE DRUG SCREEN, HOSP PERFORMED
AMPHETAMINES: NOT DETECTED
Barbiturates: NOT DETECTED
Benzodiazepines: NOT DETECTED
Cocaine: NOT DETECTED
OPIATES: NOT DETECTED
Tetrahydrocannabinol: NOT DETECTED

## 2017-05-30 LAB — I-STAT TROPONIN, ED: Troponin i, poc: 0 ng/mL (ref 0.00–0.08)

## 2017-05-30 MED ORDER — METOCLOPRAMIDE HCL 5 MG/ML IJ SOLN
10.0000 mg | Freq: Once | INTRAMUSCULAR | Status: AC
  Administered 2017-05-30: 10 mg via INTRAVENOUS
  Filled 2017-05-30: qty 2

## 2017-05-30 MED ORDER — CLONIDINE HCL 0.1 MG PO TABS
0.1000 mg | ORAL_TABLET | Freq: Once | ORAL | Status: AC
  Administered 2017-05-30: 0.1 mg via ORAL
  Filled 2017-05-30: qty 1

## 2017-05-30 MED ORDER — DIPHENHYDRAMINE HCL 50 MG/ML IJ SOLN
12.5000 mg | Freq: Once | INTRAMUSCULAR | Status: AC
  Administered 2017-05-30: 12.5 mg via INTRAVENOUS
  Filled 2017-05-30: qty 1

## 2017-05-30 MED ORDER — CLONIDINE HCL 0.1 MG PO TABS: 0.1000 mg | ORAL_TABLET | Freq: Two times a day (BID) | ORAL | 0 refills | Status: DC

## 2017-05-30 NOTE — ED Notes (Signed)
Pt complains of a headache that started two days ago, he said he had cataract surgery recently and was told to be seen if he developed a headache Pt does complain of blurred vision but denies nausea
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
## 2017-05-30 NOTE — ED Notes (Signed)
Pt transported to CT ?

## 2017-05-30 NOTE — ED Triage Notes (Signed)
Pt reports that he began having a headache on 05/27/17 and has not been relieved since then. Pt states blood pressure at home 228/189. Pt reports prior hx of HTN and is controlled with medications. Pt reports taking medications as directed and has not had any improvement.

## 2017-06-02 ENCOUNTER — Ambulatory Visit
Admission: RE | Admit: 2017-06-02 | Discharge: 2017-06-02 | Disposition: A | Discharge status: Home / Self Care | Payer: Medicare HMO | Source: Ambulatory Visit | Attending: Gastroenterology | Admitting: Gastroenterology

## 2017-06-02 DIAGNOSIS — R899 Unspecified abnormal finding in specimens from other organs, systems and tissues: Secondary | ICD-10-CM | POA: Diagnosis not present

## 2017-06-02 DIAGNOSIS — I10 Essential (primary) hypertension: Secondary | ICD-10-CM | POA: Diagnosis not present

## 2017-06-02 DIAGNOSIS — K7469 Other cirrhosis of liver: Secondary | ICD-10-CM

## 2017-06-02 DIAGNOSIS — N62 Hypertrophy of breast: Secondary | ICD-10-CM | POA: Diagnosis not present

## 2017-06-02 DIAGNOSIS — K7460 Unspecified cirrhosis of liver: Secondary | ICD-10-CM | POA: Diagnosis not present

## 2017-06-17 DIAGNOSIS — I10 Essential (primary) hypertension: Secondary | ICD-10-CM | POA: Diagnosis not present

## 2017-06-22 ENCOUNTER — Encounter: Payer: Self-pay | Admitting: Endocrinology

## 2017-06-22 ENCOUNTER — Ambulatory Visit (INDEPENDENT_AMBULATORY_CARE_PROVIDER_SITE_OTHER): Payer: Medicare HMO | Admitting: Endocrinology

## 2017-06-22 ENCOUNTER — Telehealth: Payer: Self-pay | Admitting: Endocrinology

## 2017-06-22 ENCOUNTER — Other Ambulatory Visit: Payer: Self-pay

## 2017-06-22 DIAGNOSIS — N62 Hypertrophy of breast: Secondary | ICD-10-CM

## 2017-06-22 LAB — FOLLICLE STIMULATING HORMONE: FSH: 26 m[IU]/mL — ABNORMAL HIGH (ref 1.4–18.1)

## 2017-06-22 LAB — LUTEINIZING HORMONE: LH: 12.83 m[IU]/mL — ABNORMAL HIGH (ref 1.50–9.30)

## 2017-06-22 MED ORDER — TAMOXIFEN CITRATE 10 MG PO TABS: 10.0000 mg | ORAL_TABLET | Freq: Every day | ORAL | 5 refills | Status: DC

## 2017-06-22 NOTE — Telephone Encounter (Signed)
OK.  I have sent a prescription to your pharmacy.  Please come back for a follow-up appointment in 2-3 months.

## 2017-06-22 NOTE — Patient Instructions (Signed)
blood tests are requested for you today.  We'll let you know about the results.   Even if the blood tests are normal, there is a pill I can prescribe for the breast swelling if you want to.

## 2017-06-22 NOTE — Progress Notes (Addendum)
Subjective:    Patient ID: Joshua Burton, male    DOB: 03/18/1951, 66 y.o.   MRN: [id number]  HPI Pt is referred by DR Drema Dallas, for hypogonadism.  He has h/o hep-c.  He took aldactone x 3 years. He developed moderate pain at the breast areas, and assoc pain.  The aldactone was changed to lasix 2 mos ago, and sxs improved.  Pt was the product of a normal pregnancy and delivery.  he had puberty at the normal age.  He has 3 biological children. He denies any h/o infertility.  He has never had kidney disease, cancer, cystic fibrosis, ulcerative colitis, or BPH. He has never taken cimetidine, calcium channel blockers, growth hormone, risperidone, hCG, androgens, 5-alpha-reductase inhibitors, cancer chemotherapy, estrogens, or ketoconazole.   Past Medical History:  Diagnosis Date  . Alcoholic cirrhosis ([place])   . Blood transfusion without reported diagnosis    during hip replacement 2012  . Esophageal varices ([place])   . Hepatitis C   . Hypertension   . Osteoarthritis   . Osteomyelitis ([place])   . Portal hypertension ([place])   . Thrombocytopenia ([place])     Past Surgical History:  Procedure Laterality Date  . ESOPHAGOGASTRODUODENOSCOPY N/A 09/14/2014   Procedure: ESOPHAGOGASTRODUODENOSCOPY (EGD);  Surgeon: Jerene Bears, MD;  [facility] END[facility]COPY;  Service: Endoscopy;  Laterality: N/A;  . ESOPHAGOGASTRODUODENOSCOPY N/A 02/29/2016   Procedure: ESOPHAGOGASTRODUODENOSCOPY (EGD);  Surgeon: Teena Irani, MD;  Location: [facility] ENDOSCOPY;  Service: Endoscopy;  Laterality: N/A;  . EXTERNAL FIXATION LEG Right 02/25/2014   Procedure: EXTERNAL FIXATION LEG;  Surgeon: Marianna Payment, MD;  Location: [facility];  Service: Orthopedics;  Laterality: Right;  . [place]   tib/fib// right  . HIP SURGERY  2012   right  . LAPAROSCOPIC CHOLECYSTECTOMY  2008  . leg infection  2014    Social History   Social History  . Marital status: Divorced    Spouse name: N/A  . Number of children: N/A  . Years of  education: 81   Occupational History  . 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 Smoking status: Never Smoker  . Smokeless tobacco: Never Used  . Alcohol use Yes     Comment: Daily. 1-2 beers a day. Last drink: Saturday  . Drug use: No  . Sexual activity: Not on file   Other Topics Concern  . Not on file   Social History Narrative   Regular exercise-yes   Caffeine Use-no    Current Outpatient Prescriptions on File Prior to Visit  Medication Sig Dispense Refill  . cloNIDine (CATAPRES) 0.1 MG tablet Take 1 tablet (0.1 mg total) by mouth 2 (two) times daily. 30 tablet 0  . meloxicam (MOBIC) 15 MG tablet Take 15 mg by mouth daily.   2   No current facility-administered medications on file prior to visit.     No Known Allergies  Family History  Problem Relation Age of Onset  . Arthritis Mother   . Hypertension Father   . Arthritis Brother   . Hypertension Brother   . Colon cancer Neg Hx   . Other Neg Hx        hypogonadism    BP (!) 152/98   Pulse 84   Wt 218 lb 12.8 oz (99.2 kg)   SpO2 96%   BMI 30.09 kg/m    Review of Systems denies depression, numbness, decreased urinary stream, muscle weakness, fever, headache,  easy bruising, sob, rash, blurry vision, rhinorrhea, and chest pain.  He has decreased libido and ED sxs. He has gained 15 lbs x 18 mos    Objective:   Physical Exam VS: see vs page GEN: no distress HEAD: head: no deformity eyes: no periorbital swelling, no proptosis external nose and ears are normal mouth: no lesion seen NECK: supple, thyroid is not enlarged CHEST WALL: no deformity LUNGS: clear to auscultation BREASTS:  slight bilat gynecomastia and pseudogynecomastia CV: reg rate and rhythm, no murmur ABD: abdomen is soft, nontender.  no hepatosplenomegaly.  not distended.  Self-reducing ventral hernia GENITALIA:  Normal male, except testicles are small and soft  MUSCULOSKELETAL: muscle bulk and  strength are grossly normal.  no obvious joint swelling.  gait is normal and steady EXTEMITIES: no edema PULSES: no carotid bruit NEURO:  cn 2-12 grossly intact.   readily moves all 4's.  sensation is intact to touch on all 4's SKIN:  Normal texture and temperature.  No rash or suspicious lesion is visible.  Normal hair distribution NODES:  None palpable at the neck PSYCH: alert, well-oriented.  Does not appear anxious nor depressed.  I have reviewed outside records, and summarized: Pt was noted to have gynecomastia, and referred here.  He was noted to have h/o hep c.  He has cirrhosis due to this and alcohol intake  Mammography: gynecomastia  outside test results are reviewed: E2=25 LH=26 Testosterone=342 HCG=0.76     Assessment & Plan:  Gynecomastia, new to me.  Liver disease and aldactone are common causes. Elevated gonadotropins:  Possibly due to recovery from hypogonadism.  We'll follow.    Patient Instructions  blood tests are requested for you today.  We'll let you know about the results.   Even if the blood tests are normal, there is a pill I can prescribe for the breast swelling if you want to.

## 2017-06-22 NOTE — Telephone Encounter (Signed)
Patient called in reference to wanting Rx for medication for "swelling in breast". Patient would like this sent to  [id number] - [place], [place] AT [address] [phone] (Phone) [phone] (Fax)   Please call patient and advise.

## 2017-06-23 ENCOUNTER — Telehealth: Payer: Self-pay

## 2017-06-23 MED ORDER — TAMOXIFEN CITRATE 10 MG PO TABS: 10.0000 mg | ORAL_TABLET | Freq: Every day | ORAL | 5 refills | Status: DC

## 2017-06-23 NOTE — Telephone Encounter (Signed)
Called notified patient of medication submitted, and made appointment in 2-3 months. No other questions.

## 2017-06-24 LAB — TESTOSTERONE,FREE AND TOTAL
Testosterone, Free: 8.1 pg/mL (ref 6.6–18.1)
Testosterone: 402 ng/dL (ref 264–916)

## 2017-07-03 DIAGNOSIS — I10 Essential (primary) hypertension: Secondary | ICD-10-CM | POA: Diagnosis not present

## 2017-07-03 DIAGNOSIS — Z79899 Other long term (current) drug therapy: Secondary | ICD-10-CM | POA: Diagnosis not present

## 2017-07-03 DIAGNOSIS — N62 Hypertrophy of breast: Secondary | ICD-10-CM | POA: Diagnosis not present

## 2017-07-05 DIAGNOSIS — L570 Actinic keratosis: Secondary | ICD-10-CM | POA: Diagnosis not present

## 2017-07-05 DIAGNOSIS — C44319 Basal cell carcinoma of skin of other parts of face: Secondary | ICD-10-CM | POA: Diagnosis not present

## 2017-07-05 DIAGNOSIS — X32XXXD Exposure to sunlight, subsequent encounter: Secondary | ICD-10-CM | POA: Diagnosis not present

## 2017-07-18 DIAGNOSIS — F418 Other specified anxiety disorders: Secondary | ICD-10-CM | POA: Diagnosis not present

## 2017-07-18 DIAGNOSIS — R718 Other abnormality of red blood cells: Secondary | ICD-10-CM | POA: Diagnosis not present

## 2017-07-18 DIAGNOSIS — I10 Essential (primary) hypertension: Secondary | ICD-10-CM | POA: Diagnosis not present

## 2017-09-13 ENCOUNTER — Ambulatory Visit: Payer: Medicare HMO | Admitting: Endocrinology

## 2017-09-13 ENCOUNTER — Encounter: Payer: Self-pay | Admitting: Endocrinology

## 2017-09-13 VITALS — BP 182/102 | HR 96 | Wt 222.2 lb

## 2017-09-13 DIAGNOSIS — N62 Hypertrophy of breast: Secondary | ICD-10-CM

## 2017-09-13 LAB — CBC WITH DIFFERENTIAL/PLATELET
Basophils Absolute: 0.1 K/uL (ref 0.0–0.1)
Basophils Relative: 1.8 % (ref 0.0–3.0)
Eosinophils Absolute: 0.3 K/uL (ref 0.0–0.7)
Eosinophils Relative: 6.1 % — ABNORMAL HIGH (ref 0.0–5.0)
HCT: 45.4 % (ref 39.0–52.0)
Hemoglobin: 15.2 g/dL (ref 13.0–17.0)
Lymphocytes Relative: 26 % (ref 12.0–46.0)
Lymphs Abs: 1.3 K/uL (ref 0.7–4.0)
MCHC: 33.6 g/dL (ref 30.0–36.0)
MCV: 98.4 fl (ref 78.0–100.0)
Monocytes Absolute: 0.6 K/uL (ref 0.1–1.0)
Monocytes Relative: 11.6 % (ref 3.0–12.0)
Neutro Abs: 2.7 K/uL (ref 1.4–7.7)
Neutrophils Relative %: 54.5 % (ref 43.0–77.0)
Platelets: 145 K/uL — ABNORMAL LOW (ref 150.0–400.0)
RBC: 4.61 Mil/uL (ref 4.22–5.81)
RDW: 14.3 % (ref 11.5–15.5)
WBC: 5 K/uL (ref 4.0–10.5)

## 2017-09-13 LAB — LUTEINIZING HORMONE: LH: 24.6 m[IU]/mL — ABNORMAL HIGH (ref 1.50–9.30)

## 2017-09-13 LAB — FOLLICLE STIMULATING HORMONE: FSH: 32.9 m[IU]/mL — AB (ref 1.4–18.1)

## 2017-09-13 LAB — TSH: TSH: 2.29 u[IU]/mL (ref 0.35–4.50)

## 2017-09-13 NOTE — Progress Notes (Signed)
Subjective:    Patient ID: Joshua Burton, male    DOB: [birth year], 66 y.o.   MRN: [id number]  HPI Pt returns for f/u of hypogonadism and gynecomastia; he has h/o hep-C; he took aldactone x 3 years (2015-2018); she has 3 biological children; he was rx'ed tamoxifen; he also has elevated FSH and LH, prob due to aldactone; mammography showed benign gynecomastia).  Since on tamoxifen, gynecomastia is less now.  Past Medical History:  Diagnosis Date  . Alcoholic cirrhosis ([place])   . Blood transfusion without reported diagnosis    during hip replacement 2012  . Esophageal varices ([place])   . Hepatitis C   . Hypertension   . Osteoarthritis   . Osteomyelitis ([place])   . Portal hypertension ([place])   . Thrombocytopenia ([place])     Past Surgical History:  Procedure Laterality Date  . [place]   tib/fib// right  . HIP SURGERY  2012   right  . LAPAROSCOPIC CHOLECYSTECTOMY  2008  . leg infection  2014    Social History   Socioeconomic History  . Marital status: Divorced    Spouse name: Not on file  . Number of children: Not on file  . Years of education: 33  . Highest education level: Not on file  Social Needs  . Financial resource strain: Not on file  . Food insecurity - worry: Not on file  . Food insecurity - inability: Not on file  . Transportation needs - medical: Not on file  . Transportation needs - non-medical: Not on file  Occupational History  . Occupation: Retired    Fish farm manager: [employer]    Comment: Agricultural engineer  Tobacco Use  . Smoking status: Never Smoker  . Smokeless tobacco: Never Used  Substance and Sexual Activity  . Alcohol use: Yes    Comment: Daily. 1-2 beers a day. Last drink: Saturday  . Drug use: No  . Sexual activity: Not on file  Other Topics Concern  . Not on file  Social History Narrative   Regular exercise-yes   Caffeine Use-no    Current Outpatient Medications on File Prior to Visit  Medication Sig Dispense Refill  .  cloNIDine (CATAPRES) 0.1 MG tablet Take 1 tablet (0.1 mg total) by mouth 2 (two) times daily. 30 tablet 0  . furosemide (LASIX) 20 MG tablet Take 20 mg by mouth.    Marland Kitchen lisinopril (PRINIVIL,ZESTRIL) 5 MG tablet Take 5 mg by mouth 2 (two) times daily.    . meloxicam (MOBIC) 15 MG tablet Take 15 mg by mouth daily.   2  . tamoxifen (NOLVADEX) 10 MG tablet Take 1 tablet (10 mg total) by mouth daily. 30 tablet 5   No current facility-administered medications on file prior to visit.     No Known Allergies  Family History  Problem Relation Age of Onset  . Arthritis Mother   . Hypertension Father   . Arthritis Brother   . Hypertension Brother   . Colon cancer Neg Hx   . Other Neg Hx        hypogonadism    BP (!) 182/102 (BP Location: Right Arm, Patient Position: Sitting, Cuff Size: Normal)   Pulse 96   Wt 222 lb 3.2 oz (100.8 kg)   SpO2 97%   BMI 30.56 kg/m    Review of Systems Denies decreased urinary stream.  Denies sob.     Objective:   Physical Exam VITAL SIGNS:  See vs page GENERAL: no  distress BREASTS:  slight bilat pseudogynecomastia, but no palpable gynecomastia      Assessment & Plan:  Gynecomastia: better on rx. Hypogonadism: tamoxifen will help this, also.   HTN is noted today  Patient Instructions  Please see Dr Drema Dallas as scheduled tomorrow, about your blood pressure.   blood tests are requested for you today.  We'll let you know about the results. Please continue the same medication.  Losing weight helps, also. Please come back for a follow-up appointment in 1 year.

## 2017-09-13 NOTE — Patient Instructions (Addendum)
Please see Dr Drema Dallas as scheduled tomorrow, about your blood pressure.   blood tests are requested for you today.  We'll let you know about the results. Please continue the same medication.  Losing weight helps, also. Please come back for a follow-up appointment in 1 year.

## 2017-09-14 LAB — TESTOSTERONE,FREE AND TOTAL
Testosterone, Free: 11.2 pg/mL (ref 6.6–18.1)
Testosterone: 421 ng/dL (ref 264–916)

## 2017-10-04 DIAGNOSIS — Z85828 Personal history of other malignant neoplasm of skin: Secondary | ICD-10-CM | POA: Diagnosis not present

## 2017-10-04 DIAGNOSIS — Z08 Encounter for follow-up examination after completed treatment for malignant neoplasm: Secondary | ICD-10-CM | POA: Diagnosis not present

## 2017-10-04 DIAGNOSIS — L570 Actinic keratosis: Secondary | ICD-10-CM | POA: Diagnosis not present

## 2017-10-04 DIAGNOSIS — X32XXXD Exposure to sunlight, subsequent encounter: Secondary | ICD-10-CM | POA: Diagnosis not present

## 2017-10-25 ENCOUNTER — Other Ambulatory Visit: Payer: Self-pay

## 2017-10-25 MED ORDER — TAMOXIFEN CITRATE 10 MG PO TABS: 10.0000 mg | ORAL_TABLET | Freq: Every day | ORAL | 1 refills | Status: DC

## 2017-10-27 ENCOUNTER — Other Ambulatory Visit: Payer: Self-pay

## 2017-10-27 MED ORDER — TAMOXIFEN CITRATE 10 MG PO TABS: 10.0000 mg | ORAL_TABLET | Freq: Every day | ORAL | 1 refills | Status: DC

## 2017-12-05 ENCOUNTER — Other Ambulatory Visit: Payer: Self-pay | Admitting: Gastroenterology

## 2017-12-05 DIAGNOSIS — K7469 Other cirrhosis of liver: Secondary | ICD-10-CM

## 2017-12-13 ENCOUNTER — Other Ambulatory Visit: Payer: Medicare HMO

## 2017-12-20 ENCOUNTER — Ambulatory Visit
Admission: RE | Admit: 2017-12-20 | Discharge: 2017-12-20 | Disposition: A | Discharge status: Home / Self Care | Payer: Medicare HMO | Source: Ambulatory Visit | Attending: Gastroenterology | Admitting: Gastroenterology

## 2017-12-20 DIAGNOSIS — K7469 Other cirrhosis of liver: Secondary | ICD-10-CM

## 2017-12-20 DIAGNOSIS — K7460 Unspecified cirrhosis of liver: Secondary | ICD-10-CM | POA: Diagnosis not present

## 2018-01-03 DIAGNOSIS — K7460 Unspecified cirrhosis of liver: Secondary | ICD-10-CM | POA: Diagnosis not present

## 2018-01-15 DIAGNOSIS — Z7901 Long term (current) use of anticoagulants: Secondary | ICD-10-CM | POA: Diagnosis not present

## 2018-01-15 DIAGNOSIS — Z136 Encounter for screening for cardiovascular disorders: Secondary | ICD-10-CM | POA: Diagnosis not present

## 2018-01-15 DIAGNOSIS — K7460 Unspecified cirrhosis of liver: Secondary | ICD-10-CM | POA: Diagnosis not present

## 2018-01-15 DIAGNOSIS — I10 Essential (primary) hypertension: Secondary | ICD-10-CM | POA: Diagnosis not present

## 2018-02-12 DIAGNOSIS — H35372 Puckering of macula, left eye: Secondary | ICD-10-CM | POA: Diagnosis not present

## 2018-02-12 DIAGNOSIS — H1851 Endothelial corneal dystrophy: Secondary | ICD-10-CM | POA: Diagnosis not present

## 2018-02-12 DIAGNOSIS — I10 Essential (primary) hypertension: Secondary | ICD-10-CM | POA: Diagnosis not present

## 2018-02-12 DIAGNOSIS — H26491 Other secondary cataract, right eye: Secondary | ICD-10-CM | POA: Diagnosis not present

## 2018-02-12 DIAGNOSIS — H26493 Other secondary cataract, bilateral: Secondary | ICD-10-CM | POA: Diagnosis not present

## 2018-02-26 DIAGNOSIS — H26492 Other secondary cataract, left eye: Secondary | ICD-10-CM | POA: Diagnosis not present

## 2018-03-09 DIAGNOSIS — Z8619 Personal history of other infectious and parasitic diseases: Secondary | ICD-10-CM | POA: Diagnosis not present

## 2018-03-09 DIAGNOSIS — K7460 Unspecified cirrhosis of liver: Secondary | ICD-10-CM | POA: Diagnosis not present

## 2018-03-09 DIAGNOSIS — I10 Essential (primary) hypertension: Secondary | ICD-10-CM | POA: Diagnosis not present

## 2018-03-09 DIAGNOSIS — Z8719 Personal history of other diseases of the digestive system: Secondary | ICD-10-CM | POA: Diagnosis not present

## 2018-03-09 DIAGNOSIS — Z862 Personal history of diseases of the blood and blood-forming organs and certain disorders involving the immune mechanism: Secondary | ICD-10-CM | POA: Diagnosis not present

## 2018-07-11 DIAGNOSIS — H43812 Vitreous degeneration, left eye: Secondary | ICD-10-CM | POA: Diagnosis not present

## 2018-07-11 DIAGNOSIS — H43813 Vitreous degeneration, bilateral: Secondary | ICD-10-CM | POA: Diagnosis not present

## 2018-07-11 DIAGNOSIS — Z961 Presence of intraocular lens: Secondary | ICD-10-CM | POA: Diagnosis not present

## 2018-07-11 DIAGNOSIS — H43811 Vitreous degeneration, right eye: Secondary | ICD-10-CM | POA: Diagnosis not present

## 2018-07-16 ENCOUNTER — Encounter (INDEPENDENT_AMBULATORY_CARE_PROVIDER_SITE_OTHER): Payer: Medicare HMO | Admitting: Ophthalmology

## 2018-07-16 ENCOUNTER — Other Ambulatory Visit: Payer: Self-pay

## 2018-07-16 ENCOUNTER — Encounter (HOSPITAL_COMMUNITY): Payer: Self-pay | Admitting: *Deleted

## 2018-07-16 DIAGNOSIS — H338 Other retinal detachments: Secondary | ICD-10-CM | POA: Diagnosis not present

## 2018-07-16 DIAGNOSIS — I10 Essential (primary) hypertension: Secondary | ICD-10-CM

## 2018-07-16 DIAGNOSIS — H33001 Unspecified retinal detachment with retinal break, right eye: Secondary | ICD-10-CM | POA: Diagnosis present

## 2018-07-16 DIAGNOSIS — H35033 Hypertensive retinopathy, bilateral: Secondary | ICD-10-CM

## 2018-07-16 DIAGNOSIS — H43813 Vitreous degeneration, bilateral: Secondary | ICD-10-CM | POA: Diagnosis not present

## 2018-07-16 DIAGNOSIS — H35372 Puckering of macula, left eye: Secondary | ICD-10-CM

## 2018-07-16 NOTE — H&P (Signed)
Joshua Burton is an 67 y.o. male.   Chief Complaint: floaters right eye for 9 days HPI: Noted floater OD 9 days ago.  Now losing inferior vision  Past Medical History:  Diagnosis Date  . Alcoholic cirrhosis ([place])   . Blood transfusion without reported diagnosis    during hip replacement 2012  . Esophageal varices ([place])   . Hepatitis C   . Hypertension   . Osteoarthritis   . Osteomyelitis ([place])   . Portal hypertension ([place])   . Thrombocytopenia ([place])     Past Surgical History:  Procedure Laterality Date  . ESOPHAGOGASTRODUODENOSCOPY N/A 09/14/2014   Procedure: ESOPHAGOGASTRODUODENOSCOPY (EGD);  Surgeon: Jerene Bears, MD;  Location: [facility] ENDOSCOPY;  Service: Endoscopy;  Laterality: N/A;  . ESOPHAGOGASTRODUODENOSCOPY N/A 02/29/2016   Procedure: ESOPHAGOGASTRODUODENOSCOPY (EGD);  Surgeon: Teena Irani, MD;  Location: [facility] ENDOSCOPY;  Service: Endoscopy;  Laterality: N/A;  . EXTERNAL FIXATION LEG Right 02/25/2014   Procedure: EXTERNAL FIXATION LEG;  Surgeon: Marianna Payment, MD;  Location: [place];  Service: Orthopedics;  Laterality: Right;  . [place]   tib/fib// right  . HIP SURGERY  2012   right  . LAPAROSCOPIC CHOLECYSTECTOMY  2008  . leg infection  2014    Family History  Problem Relation Age of Onset  . Arthritis Mother   . Hypertension Father   . Arthritis Brother   . Hypertension Brother   . Colon cancer Neg Hx   . Other Neg Hx        hypogonadism   Social History:  reports that he has never smoked. He has never used smokeless tobacco. He reports that he drinks alcohol. He reports that he does not use drugs.  Allergies: No Known Allergies  No medications prior to admission.    Review of systems otherwise negative  There were no vitals taken for this visit.  Physical exam: Mental status: oriented x3. Eyes: See eye exam associated with this date of surgery in media tab.  Scanned in by scanning center Ears, Nose, Throat: within normal limits Neck:  Within Normal limits General: within normal limits Chest: Within normal limits Breast: deferred Heart: Within normal limits Abdomen: Within normal limits GU: deferred Extremities: within normal limits Skin: within normal limits  Assessment/Plan Rhegmatogenous retinal detachment right eye Plan: To [facility] for Scleral buckle, laser, gas injection, possible pars plana vitrectomy right eye  Hayden Pedro 07/16/2018, 11:41 AM

## 2018-07-17 ENCOUNTER — Encounter (HOSPITAL_COMMUNITY): Payer: Self-pay | Admitting: Surgery

## 2018-07-17 ENCOUNTER — Ambulatory Visit (HOSPITAL_COMMUNITY): Payer: Medicare HMO | Admitting: Anesthesiology

## 2018-07-17 ENCOUNTER — Encounter (HOSPITAL_COMMUNITY)
Admission: RE | Disposition: A | Discharge status: Home / Self Care | Payer: Self-pay | Source: Ambulatory Visit | Attending: Ophthalmology

## 2018-07-17 ENCOUNTER — Ambulatory Visit (HOSPITAL_COMMUNITY)
Admission: RE | Admit: 2018-07-17 | Discharge: 2018-07-18 | Disposition: A | Discharge status: Home / Self Care | Payer: Medicare HMO | Source: Ambulatory Visit | Attending: Ophthalmology | Admitting: Ophthalmology

## 2018-07-17 DIAGNOSIS — I10 Essential (primary) hypertension: Secondary | ICD-10-CM | POA: Insufficient documentation

## 2018-07-17 DIAGNOSIS — Z8249 Family history of ischemic heart disease and other diseases of the circulatory system: Secondary | ICD-10-CM | POA: Insufficient documentation

## 2018-07-17 DIAGNOSIS — D696 Thrombocytopenia, unspecified: Secondary | ICD-10-CM | POA: Diagnosis not present

## 2018-07-17 DIAGNOSIS — Z9049 Acquired absence of other specified parts of digestive tract: Secondary | ICD-10-CM | POA: Insufficient documentation

## 2018-07-17 DIAGNOSIS — Z8261 Family history of arthritis: Secondary | ICD-10-CM | POA: Diagnosis not present

## 2018-07-17 DIAGNOSIS — D62 Acute posthemorrhagic anemia: Secondary | ICD-10-CM | POA: Diagnosis not present

## 2018-07-17 DIAGNOSIS — B1920 Unspecified viral hepatitis C without hepatic coma: Secondary | ICD-10-CM | POA: Insufficient documentation

## 2018-07-17 DIAGNOSIS — H338 Other retinal detachments: Secondary | ICD-10-CM | POA: Diagnosis not present

## 2018-07-17 DIAGNOSIS — M869 Osteomyelitis, unspecified: Secondary | ICD-10-CM | POA: Diagnosis not present

## 2018-07-17 DIAGNOSIS — H33021 Retinal detachment with multiple breaks, right eye: Secondary | ICD-10-CM | POA: Insufficient documentation

## 2018-07-17 DIAGNOSIS — K766 Portal hypertension: Secondary | ICD-10-CM | POA: Diagnosis not present

## 2018-07-17 DIAGNOSIS — I8510 Secondary esophageal varices without bleeding: Secondary | ICD-10-CM | POA: Diagnosis not present

## 2018-07-17 DIAGNOSIS — K7030 Alcoholic cirrhosis of liver without ascites: Secondary | ICD-10-CM | POA: Insufficient documentation

## 2018-07-17 DIAGNOSIS — M1990 Unspecified osteoarthritis, unspecified site: Secondary | ICD-10-CM | POA: Insufficient documentation

## 2018-07-17 DIAGNOSIS — H33001 Unspecified retinal detachment with retinal break, right eye: Secondary | ICD-10-CM | POA: Diagnosis present

## 2018-07-17 DIAGNOSIS — Z96641 Presence of right artificial hip joint: Secondary | ICD-10-CM | POA: Insufficient documentation

## 2018-07-17 DIAGNOSIS — K922 Gastrointestinal hemorrhage, unspecified: Secondary | ICD-10-CM | POA: Diagnosis not present

## 2018-07-17 HISTORY — PX: PHOTOCOAGULATION WITH LASER: SHX6027

## 2018-07-17 HISTORY — PX: SCLERAL BUCKLE: SHX5340

## 2018-07-17 HISTORY — DX: Personal history of other medical treatment: Z92.89

## 2018-07-17 LAB — CBC
HEMATOCRIT: 45.1 % (ref 39.0–52.0)
Hemoglobin: 15 g/dL (ref 13.0–17.0)
MCH: 33.1 pg (ref 26.0–34.0)
MCHC: 33.3 g/dL (ref 30.0–36.0)
MCV: 99.6 fL (ref 78.0–100.0)
PLATELETS: 146 10*3/uL — AB (ref 150–400)
RBC: 4.53 MIL/uL (ref 4.22–5.81)
RDW: 13.2 % (ref 11.5–15.5)
WBC: 5.2 10*3/uL (ref 4.0–10.5)

## 2018-07-17 LAB — COMPREHENSIVE METABOLIC PANEL
ALK PHOS: 66 U/L (ref 38–126)
ALT: 25 U/L (ref 0–44)
AST: 29 U/L (ref 15–41)
Albumin: 4 g/dL (ref 3.5–5.0)
Anion gap: 9 (ref 5–15)
BUN: 14 mg/dL (ref 8–23)
CO2: 22 mmol/L (ref 22–32)
Calcium: 9 mg/dL (ref 8.9–10.3)
Chloride: 107 mmol/L (ref 98–111)
Creatinine, Ser: 1 mg/dL (ref 0.61–1.24)
GFR calc Af Amer: >60 mL/min (ref >60)
GFR calc non Af Amer: >60 mL/min (ref >60)
Glucose, Bld: 123 mg/dL — ABNORMAL HIGH (ref 70–99)
Potassium: 4.3 mmol/L (ref 3.5–5.1)
SODIUM: 138 mmol/L (ref 135–145)
TOTAL PROTEIN: 6.5 g/dL (ref 6.5–8.1)
Total Bilirubin: 0.9 mg/dL (ref 0.3–1.2)

## 2018-07-17 LAB — PROTIME-INR
INR: 1.11
Prothrombin Time: 14.3 seconds (ref 11.4–15.2)

## 2018-07-17 SURGERY — SCLERAL BUCKLE
Anesthesia: General | Site: Eye | Laterality: Right

## 2018-07-17 MED ORDER — BACITRACIN-POLYMYXIN B 500-10000 UNIT/GM OP OINT
TOPICAL_OINTMENT | OPHTHALMIC | Status: DC | PRN
  Administered 2018-07-17: 1 via OPHTHALMIC

## 2018-07-17 MED ORDER — ATROPINE SULFATE 1 % OP SOLN
OPHTHALMIC | Status: DC | PRN
  Administered 2018-07-17: 1 [drp] via OPHTHALMIC

## 2018-07-17 MED ORDER — FENTANYL CITRATE (PF) 100 MCG/2ML IJ SOLN
INTRAMUSCULAR | Status: AC
  Filled 2018-07-17: qty 2

## 2018-07-17 MED ORDER — PREDNISOLONE ACETATE 1 % OP SUSP
1.0000 [drp] | Freq: Four times a day (QID) | OPHTHALMIC | Status: DC
  Filled 2018-07-17: qty 5

## 2018-07-17 MED ORDER — ACETAMINOPHEN 325 MG PO TABS: 325.0000 mg | ORAL_TABLET | ORAL | Status: DC | PRN

## 2018-07-17 MED ORDER — BUPIVACAINE HCL (PF) 0.75 % IJ SOLN
INTRAMUSCULAR | Status: DC | PRN
  Administered 2018-07-17: 10 mL

## 2018-07-17 MED ORDER — ONDANSETRON HCL 4 MG/2ML IJ SOLN: 4.0000 mg | Freq: Four times a day (QID) | INTRAMUSCULAR | Status: DC | PRN

## 2018-07-17 MED ORDER — 0.9 % SODIUM CHLORIDE (POUR BTL) OPTIME
TOPICAL | Status: DC | PRN
  Administered 2018-07-17: 1000 mL

## 2018-07-17 MED ORDER — PHENYLEPHRINE HCL 10 % OP SOLN
1.0000 [drp] | Freq: Once | OPHTHALMIC | Status: AC
  Administered 2018-07-17: 1 [drp] via OPHTHALMIC
  Filled 2018-07-17: qty 5

## 2018-07-17 MED ORDER — FENTANYL CITRATE (PF) 250 MCG/5ML IJ SOLN
INTRAMUSCULAR | Status: DC | PRN
  Administered 2018-07-17: 50 ug via INTRAVENOUS
  Administered 2018-07-17: 25 ug via INTRAVENOUS
  Administered 2018-07-17 (×2): 50 ug via INTRAVENOUS
  Administered 2018-07-17 (×3): 25 ug via INTRAVENOUS

## 2018-07-17 MED ORDER — FENTANYL CITRATE (PF) 250 MCG/5ML IJ SOLN
INTRAMUSCULAR | Status: AC
  Filled 2018-07-17: qty 5

## 2018-07-17 MED ORDER — ACETAZOLAMIDE SODIUM 500 MG IJ SOLR
500.0000 mg | Freq: Once | INTRAMUSCULAR | Status: AC
  Administered 2018-07-18: 500 mg via INTRAVENOUS
  Filled 2018-07-17: qty 500

## 2018-07-17 MED ORDER — STERILE WATER FOR IRRIGATION IR SOLN
Status: DC | PRN
  Administered 2018-07-17: 1000 mL

## 2018-07-17 MED ORDER — LACTATED RINGERS IV SOLN
INTRAVENOUS | Status: DC | PRN
  Administered 2018-07-17: 12:00:00 via INTRAVENOUS

## 2018-07-17 MED ORDER — CEFAZOLIN SODIUM-DEXTROSE 2-4 GM/100ML-% IV SOLN
2.0000 g | INTRAVENOUS | Status: AC
  Administered 2018-07-17: 2 g via INTRAVENOUS
  Filled 2018-07-17: qty 100

## 2018-07-17 MED ORDER — DEXAMETHASONE SODIUM PHOSPHATE 10 MG/ML IJ SOLN
INTRAMUSCULAR | Status: DC | PRN
  Administered 2018-07-17: 10 mg via INTRAVENOUS

## 2018-07-17 MED ORDER — MORPHINE SULFATE (PF) 2 MG/ML IV SOLN
1.0000 mg | INTRAVENOUS | Status: AC | PRN
  Administered 2018-07-17 (×2): 2 mg via INTRAVENOUS
  Filled 2018-07-17 (×2): qty 1

## 2018-07-17 MED ORDER — STERILE WATER FOR INJECTION IJ SOLN
INTRAMUSCULAR | Status: DC | PRN
  Administered 2018-07-17: 20 mL

## 2018-07-17 MED ORDER — BUPIVACAINE-EPINEPHRINE (PF) 0.25% -1:200000 IJ SOLN
INTRAMUSCULAR | Status: AC
  Filled 2018-07-17: qty 30

## 2018-07-17 MED ORDER — MIDAZOLAM HCL 2 MG/2ML IJ SOLN
INTRAMUSCULAR | Status: AC
  Filled 2018-07-17: qty 2

## 2018-07-17 MED ORDER — BSS IO SOLN
INTRAOCULAR | Status: DC | PRN
  Administered 2018-07-17: 15 mL

## 2018-07-17 MED ORDER — DEXAMETHASONE SODIUM PHOSPHATE 10 MG/ML IJ SOLN
INTRAMUSCULAR | Status: AC
  Filled 2018-07-17: qty 1

## 2018-07-17 MED ORDER — DORZOLAMIDE HCL 2 % OP SOLN
1.0000 [drp] | Freq: Three times a day (TID) | OPHTHALMIC | Status: DC
  Filled 2018-07-17: qty 10

## 2018-07-17 MED ORDER — TRIAMCINOLONE ACETONIDE 40 MG/ML IJ SUSP
INTRAMUSCULAR | Status: AC
  Filled 2018-07-17: qty 5

## 2018-07-17 MED ORDER — HYALURONIDASE HUMAN 150 UNIT/ML IJ SOLN
INTRAMUSCULAR | Status: AC
  Filled 2018-07-17: qty 1

## 2018-07-17 MED ORDER — FUROSEMIDE 20 MG PO TABS
20.0000 mg | ORAL_TABLET | Freq: Every day | ORAL | Status: DC
  Administered 2018-07-18: 20 mg via ORAL
  Filled 2018-07-17: qty 1

## 2018-07-17 MED ORDER — GATIFLOXACIN 0.5 % OP SOLN
1.0000 [drp] | Freq: Four times a day (QID) | OPHTHALMIC | Status: DC
  Filled 2018-07-17: qty 2.5

## 2018-07-17 MED ORDER — ROCURONIUM BROMIDE 10 MG/ML (PF) SYRINGE
PREFILLED_SYRINGE | INTRAVENOUS | Status: DC | PRN
  Administered 2018-07-17: 10 mg via INTRAVENOUS
  Administered 2018-07-17: 50 mg via INTRAVENOUS
  Administered 2018-07-17: 10 mg via INTRAVENOUS
  Administered 2018-07-17: 5 mg via INTRAVENOUS
  Administered 2018-07-17: 10 mg via INTRAVENOUS

## 2018-07-17 MED ORDER — HYDROCODONE-ACETAMINOPHEN 5-325 MG PO TABS
1.0000 | ORAL_TABLET | ORAL | Status: DC | PRN
  Administered 2018-07-17 – 2018-07-18 (×5): 2 via ORAL
  Filled 2018-07-17 (×5): qty 2

## 2018-07-17 MED ORDER — CEFTAZIDIME 1 G IJ SOLR
INTRAMUSCULAR | Status: AC
  Filled 2018-07-17: qty 1

## 2018-07-17 MED ORDER — BACITRACIN-POLYMYXIN B 500-10000 UNIT/GM OP OINT
TOPICAL_OINTMENT | OPHTHALMIC | Status: AC
  Filled 2018-07-17: qty 3.5

## 2018-07-17 MED ORDER — LISINOPRIL 5 MG PO TABS
5.0000 mg | ORAL_TABLET | Freq: Two times a day (BID) | ORAL | Status: DC
  Administered 2018-07-18: 5 mg via ORAL
  Filled 2018-07-17: qty 1

## 2018-07-17 MED ORDER — PHENYLEPHRINE HCL 2.5 % OP SOLN
1.0000 [drp] | OPHTHALMIC | Status: AC | PRN
  Administered 2018-07-17 (×3): 1 [drp] via OPHTHALMIC
  Filled 2018-07-17: qty 2

## 2018-07-17 MED ORDER — EPINEPHRINE PF 1 MG/ML IJ SOLN
INTRAMUSCULAR | Status: AC
  Filled 2018-07-17: qty 1

## 2018-07-17 MED ORDER — GATIFLOXACIN 0.5 % OP SOLN
1.0000 [drp] | OPHTHALMIC | Status: AC | PRN
  Administered 2018-07-17 (×3): 1 [drp] via OPHTHALMIC
  Filled 2018-07-17: qty 2.5

## 2018-07-17 MED ORDER — ONDANSETRON HCL 4 MG/2ML IJ SOLN
INTRAMUSCULAR | Status: DC | PRN
  Administered 2018-07-17: 4 mg via INTRAVENOUS

## 2018-07-17 MED ORDER — MIDAZOLAM HCL 5 MG/5ML IJ SOLN
INTRAMUSCULAR | Status: DC | PRN
  Administered 2018-07-17: 2 mg via INTRAVENOUS

## 2018-07-17 MED ORDER — PROPOFOL 10 MG/ML IV BOLUS
INTRAVENOUS | Status: DC | PRN
  Administered 2018-07-17: 170 mg via INTRAVENOUS

## 2018-07-17 MED ORDER — LATANOPROST 0.005 % OP SOLN
1.0000 [drp] | Freq: Every day | OPHTHALMIC | Status: DC
  Filled 2018-07-17: qty 2.5

## 2018-07-17 MED ORDER — SODIUM CHLORIDE 0.9 % IV SOLN
INTRAVENOUS | Status: DC
  Administered 2018-07-17: 11:00:00 via INTRAVENOUS

## 2018-07-17 MED ORDER — SODIUM HYALURONATE 10 MG/ML IO SOLN
INTRAOCULAR | Status: AC
  Filled 2018-07-17: qty 0.85

## 2018-07-17 MED ORDER — FENTANYL CITRATE (PF) 100 MCG/2ML IJ SOLN
25.0000 ug | INTRAMUSCULAR | Status: DC | PRN
  Administered 2018-07-17: 25 ug via INTRAVENOUS
  Administered 2018-07-17: 50 ug via INTRAVENOUS
  Administered 2018-07-17: 25 ug via INTRAVENOUS
  Administered 2018-07-17: 50 ug via INTRAVENOUS

## 2018-07-17 MED ORDER — ACETAZOLAMIDE SODIUM 500 MG IJ SOLR
INTRAMUSCULAR | Status: AC
  Filled 2018-07-17: qty 500

## 2018-07-17 MED ORDER — CYCLOPENTOLATE HCL 1 % OP SOLN
1.0000 [drp] | OPHTHALMIC | Status: AC | PRN
  Administered 2018-07-17 (×3): 1 [drp] via OPHTHALMIC
  Filled 2018-07-17: qty 2

## 2018-07-17 MED ORDER — TETRACAINE HCL 0.5 % OP SOLN
2.0000 [drp] | Freq: Once | OPHTHALMIC | Status: DC
  Filled 2018-07-17: qty 4

## 2018-07-17 MED ORDER — EPINEPHRINE PF 1 MG/ML IJ SOLN
INTRAOCULAR | Status: DC | PRN
  Administered 2018-07-17: 11:00:00

## 2018-07-17 MED ORDER — TAMOXIFEN CITRATE 10 MG PO TABS
10.0000 mg | ORAL_TABLET | Freq: Every day | ORAL | Status: DC
  Administered 2018-07-18: 10 mg via ORAL
  Filled 2018-07-17: qty 1

## 2018-07-17 MED ORDER — CLONIDINE HCL 0.1 MG PO TABS
0.1000 mg | ORAL_TABLET | Freq: Two times a day (BID) | ORAL | Status: DC
  Administered 2018-07-17 – 2018-07-18 (×2): 0.1 mg via ORAL
  Filled 2018-07-17 (×2): qty 1

## 2018-07-17 MED ORDER — LIDOCAINE 2% (20 MG/ML) 5 ML SYRINGE
INTRAMUSCULAR | Status: DC | PRN
  Administered 2018-07-17: 60 mg via INTRAVENOUS

## 2018-07-17 MED ORDER — ONDANSETRON HCL 4 MG/2ML IJ SOLN: 4.0000 mg | Freq: Once | INTRAMUSCULAR | Status: DC | PRN

## 2018-07-17 MED ORDER — BSS PLUS IO SOLN
INTRAOCULAR | Status: AC
  Filled 2018-07-17: qty 500

## 2018-07-17 MED ORDER — SODIUM CHLORIDE 0.45 % IV SOLN
INTRAVENOUS | Status: DC
  Administered 2018-07-17: 17:00:00 via INTRAVENOUS

## 2018-07-17 MED ORDER — BUPIVACAINE HCL (PF) 0.75 % IJ SOLN
INTRAMUSCULAR | Status: AC
  Filled 2018-07-17: qty 10

## 2018-07-17 MED ORDER — SUGAMMADEX SODIUM 200 MG/2ML IV SOLN
INTRAVENOUS | Status: DC | PRN
  Administered 2018-07-17: 200 mg via INTRAVENOUS

## 2018-07-17 MED ORDER — ROCURONIUM BROMIDE 50 MG/5ML IV SOSY
PREFILLED_SYRINGE | INTRAVENOUS | Status: AC
  Filled 2018-07-17: qty 5

## 2018-07-17 MED ORDER — DEXAMETHASONE SODIUM PHOSPHATE 10 MG/ML IJ SOLN
INTRAMUSCULAR | Status: DC | PRN
  Administered 2018-07-17: 10 mg

## 2018-07-17 MED ORDER — SODIUM CHLORIDE 0.9 % IJ SOLN
INTRAMUSCULAR | Status: AC
  Filled 2018-07-17: qty 10

## 2018-07-17 MED ORDER — BACITRACIN-POLYMYXIN B 500-10000 UNIT/GM OP OINT
1.0000 "application " | TOPICAL_OINTMENT | Freq: Three times a day (TID) | OPHTHALMIC | Status: DC
  Filled 2018-07-17: qty 3.5

## 2018-07-17 MED ORDER — BRIMONIDINE TARTRATE 0.2 % OP SOLN
1.0000 [drp] | Freq: Two times a day (BID) | OPHTHALMIC | Status: DC
  Filled 2018-07-17: qty 5

## 2018-07-17 MED ORDER — MELOXICAM 7.5 MG PO TABS
15.0000 mg | ORAL_TABLET | Freq: Every day | ORAL | Status: DC
  Administered 2018-07-18: 15 mg via ORAL
  Filled 2018-07-17: qty 2

## 2018-07-17 MED ORDER — TEMAZEPAM 15 MG PO CAPS: 15.0000 mg | ORAL_CAPSULE | Freq: Every evening | ORAL | Status: DC | PRN

## 2018-07-17 MED ORDER — ATROPINE SULFATE 1 % OP SOLN
OPHTHALMIC | Status: AC
  Filled 2018-07-17: qty 5

## 2018-07-17 MED ORDER — MAGNESIUM HYDROXIDE 400 MG/5ML PO SUSP: 15.0000 mL | Freq: Four times a day (QID) | ORAL | Status: DC | PRN

## 2018-07-17 MED ORDER — HYDROMORPHONE HCL 1 MG/ML IJ SOLN
INTRAMUSCULAR | Status: AC
  Filled 2018-07-17: qty 1

## 2018-07-17 MED ORDER — HYPROMELLOSE (GONIOSCOPIC) 2.5 % OP SOLN
OPHTHALMIC | Status: AC
  Filled 2018-07-17: qty 15

## 2018-07-17 MED ORDER — HYDROMORPHONE HCL 1 MG/ML IJ SOLN
0.5000 mg | INTRAMUSCULAR | Status: AC | PRN
  Administered 2018-07-17 (×2): 0.5 mg via INTRAVENOUS

## 2018-07-17 MED ORDER — TROPICAMIDE 1 % OP SOLN
1.0000 [drp] | OPHTHALMIC | Status: AC | PRN
  Administered 2018-07-17 (×3): 1 [drp] via OPHTHALMIC
  Filled 2018-07-17: qty 15

## 2018-07-17 MED ORDER — BSS IO SOLN
INTRAOCULAR | Status: AC
  Filled 2018-07-17: qty 15

## 2018-07-17 MED ORDER — LIDOCAINE HCL 2 % IJ SOLN
INTRAMUSCULAR | Status: AC
  Filled 2018-07-17: qty 20

## 2018-07-17 MED ORDER — PNEUMOCOCCAL VAC POLYVALENT 25 MCG/0.5ML IJ INJ: 0.5000 mL | INJECTION | INTRAMUSCULAR | Status: DC

## 2018-07-17 MED ORDER — POLYMYXIN B SULFATE 500000 UNITS IJ SOLR
INTRAMUSCULAR | Status: AC
  Filled 2018-07-17: qty 500000

## 2018-07-17 MED ORDER — SODIUM CHLORIDE 0.9 % IV SOLN
INTRAVENOUS | Status: DC | PRN
  Administered 2018-07-17: 40 ug/min via INTRAVENOUS

## 2018-07-17 MED ORDER — SODIUM HYALURONATE 10 MG/ML IO SOLN
INTRAOCULAR | Status: DC | PRN
  Administered 2018-07-17: 0.85 mL via INTRAOCULAR

## 2018-07-17 MED ORDER — STERILE WATER FOR INJECTION IJ SOLN
INTRAMUSCULAR | Status: AC
  Filled 2018-07-17: qty 20

## 2018-07-17 SURGICAL SUPPLY — 86 items
APL SRG 3 HI ABS STRL LF PLS (MISCELLANEOUS) ×6
APPLICATOR DR MATTHEWS STRL (MISCELLANEOUS) ×12 IMPLANT
BALL CTTN LRG ABS STRL LF (GAUZE/BANDAGES/DRESSINGS) ×3
BAND CIRCLING RETINAL 2.5 (Ophthalmic Related) IMPLANT
BAND RTNL 125X2.5X.6 CRC (Ophthalmic Related) ×1 IMPLANT
BLADE EYE CATARACT 19 1.4 BEAV (BLADE) ×1 IMPLANT
BLADE MVR KNIFE 19G (BLADE) IMPLANT
CANNULA ANT CHAM MAIN (OPHTHALMIC RELATED) IMPLANT
CANNULA DUAL BORE 23G (CANNULA) IMPLANT
CANNULA VLV SOFT TIP 27G (OPHTHALMIC) IMPLANT
CANNULA VLV SOFT TIP 27GA (OPHTHALMIC) ×2 IMPLANT
CORDS BIPOLAR (ELECTRODE) ×1 IMPLANT
COTTONBALL LRG STERILE PKG (GAUZE/BANDAGES/DRESSINGS) ×6 IMPLANT
COVER MAYO STAND STRL (DRAPES) IMPLANT
COVER SURGICAL LIGHT HANDLE (MISCELLANEOUS) ×2 IMPLANT
DRAPE INCISE 51X51 W/FILM STRL (DRAPES) ×2 IMPLANT
DRAPE OPHTHALMIC 77X100 STRL (CUSTOM PROCEDURE TRAY) ×2 IMPLANT
ERASER HMR WETFIELD 23G BP (MISCELLANEOUS) IMPLANT
FILTER BLUE MILLIPORE (MISCELLANEOUS) ×1 IMPLANT
FILTER STRAW FLUID ASPIR (MISCELLANEOUS) IMPLANT
FORCEPS GRIESHABER ILM 25G A (INSTRUMENTS) IMPLANT
GAS AUTO FILL CONSTEL (OPHTHALMIC) ×2
GAS AUTO FILL CONSTELLATION (OPHTHALMIC) IMPLANT
GAS OPHTHALMIC (MISCELLANEOUS) ×1 IMPLANT
GLOVE SS BIOGEL STRL SZ 6.5 (GLOVE) ×1 IMPLANT
GLOVE SS BIOGEL STRL SZ 7 (GLOVE) ×1 IMPLANT
GLOVE SUPERSENSE BIOGEL SZ 6.5 (GLOVE) ×1
GLOVE SUPERSENSE BIOGEL SZ 7 (GLOVE) ×1
GLOVE SURG 8.5 LATEX PF (GLOVE) ×2 IMPLANT
GOWN STRL REUS W/ TWL LRG LVL3 (GOWN DISPOSABLE) ×2 IMPLANT
GOWN STRL REUS W/TWL LRG LVL3 (GOWN DISPOSABLE) ×4
HANDLE PNEUMATIC FOR CONSTEL (OPHTHALMIC) IMPLANT
IMPL SILICONE (Ophthalmic Related) ×1 IMPLANT
IMPLANT SILICONE (Ophthalmic Related) ×4 IMPLANT
KIT BASIN OR (CUSTOM PROCEDURE TRAY) ×2 IMPLANT
KIT PERFLUORON PROCEDURE 5ML (MISCELLANEOUS) IMPLANT
KIT TURNOVER KIT B (KITS) ×2 IMPLANT
KNIFE CRESCENT 1.75 EDGEAHEAD (BLADE) IMPLANT
KNIFE GRIESHABER SHARP 2.5MM (MISCELLANEOUS) ×6 IMPLANT
MICROPICK 25G (MISCELLANEOUS)
NDL 18GX1X1/2 (RX/OR ONLY) (NEEDLE) ×1 IMPLANT
NDL 25GX 5/8IN NON SAFETY (NEEDLE) IMPLANT
NDL HYPO 30X.5 LL (NEEDLE) IMPLANT
NDL PRECISIONGLIDE 27X1.5 (NEEDLE) IMPLANT
NEEDLE 18GX1X1/2 (RX/OR ONLY) (NEEDLE) ×2 IMPLANT
NEEDLE 25GX 5/8IN NON SAFETY (NEEDLE) IMPLANT
NEEDLE HYPO 30X.5 LL (NEEDLE) IMPLANT
NEEDLE PRECISIONGLIDE 27X1.5 (NEEDLE) IMPLANT
NS IRRIG 1000ML POUR BTL (IV SOLUTION) ×2 IMPLANT
PACK VITRECTOMY CUSTOM (CUSTOM PROCEDURE TRAY) ×2 IMPLANT
PAD ARMBOARD 7.5X6 YLW CONV (MISCELLANEOUS) ×4 IMPLANT
PAK PIK VITRECTOMY CVS 25GA (OPHTHALMIC) IMPLANT
PAK VITRECTOMY PIK  27GA (OPHTHALMIC) ×1
PAK VITRECTOMY PIK 27GA (OPHTHALMIC) IMPLANT
PIC ILLUMINATED 25G (OPHTHALMIC)
PICK MICROPICK 25G (MISCELLANEOUS) IMPLANT
PIK ILLUMINATED 25G (OPHTHALMIC) IMPLANT
PROBE DIATHERMY DSP 27GA (MISCELLANEOUS) ×1 IMPLANT
PROBE LASER ILLUM FLEX 27GA (OPHTHALMIC) ×1 IMPLANT
PROBE LASER ILLUM FLEX CVD 25G (OPHTHALMIC) IMPLANT
REPL STRA BRUSH NDL (NEEDLE) IMPLANT
REPL STRA BRUSH NEEDLE (NEEDLE) IMPLANT
RESERVOIR BACK FLUSH (MISCELLANEOUS) IMPLANT
ROLLS DENTAL (MISCELLANEOUS) ×4 IMPLANT
SLEEVE SCLERAL TYPE 270 (Ophthalmic Related) ×1 IMPLANT
SPEAR EYE SURG WECK-CEL (MISCELLANEOUS) ×8 IMPLANT
SPONGE GROOVED SILICONE 4X12X8 (Ophthalmic Related) ×1 IMPLANT
SPONGE SURGIFOAM ABS GEL 12-7 (HEMOSTASIS) IMPLANT
STOPCOCK 4 WAY LG BORE MALE ST (IV SETS) IMPLANT
SUT CHROMIC 7 0 TG140 8 (SUTURE) ×2 IMPLANT
SUT ETHILON 9 0 TG140 8 (SUTURE) IMPLANT
SUT MERSILENE 4 0 RV 2 (SUTURE) ×4 IMPLANT
SUT SILK 2 0 (SUTURE) ×2
SUT SILK 2-0 18XBRD TIE 12 (SUTURE) ×1 IMPLANT
SUT SILK 4 0 RB 1 (SUTURE) ×2 IMPLANT
SYR 10ML LL (SYRINGE) IMPLANT
SYR 20CC LL (SYRINGE) IMPLANT
SYR 50ML LL SCALE MARK (SYRINGE) IMPLANT
SYR 5ML LL (SYRINGE) IMPLANT
SYR BULB 3OZ (MISCELLANEOUS) ×2 IMPLANT
SYR TB 1ML LUER SLIP (SYRINGE) IMPLANT
TIRE RTNL 2.5XGRV CNCV 9X (Ophthalmic Related) IMPLANT
TOWEL OR 17X24 6PK STRL BLUE (TOWEL DISPOSABLE) ×2 IMPLANT
TUBING HIGH PRESS EXTEN 6IN (TUBING) IMPLANT
WATER STERILE IRR 1000ML POUR (IV SOLUTION) ×2 IMPLANT
WIPE INSTRUMENT VISIWIPE 73X73 (MISCELLANEOUS) ×2 IMPLANT

## 2018-07-17 NOTE — Transfer of Care (Signed)
Immediate Anesthesia Transfer of Care Note  Patient: Joshua Burton  Procedure(s) Performed: SCLERAL BUCKLE RIGHT EYE WITH GAS INJECTION (Right Eye) PHOTOCOAGULATION WITH LASER (Right Eye)  Patient Location: PACU  Anesthesia Type:General  Level of Consciousness: awake, alert  and oriented  Airway & Oxygen Therapy: Patient Spontanous Breathing and Patient connected to nasal cannula oxygen  Post-op Assessment: Report given to RN and Post -op Vital signs reviewed and stable  Post vital signs: Reviewed and stable  Last Vitals:  Vitals Value Taken Time  BP 143/88 07/17/2018  2:45 PM  Temp    Pulse 101 07/17/2018  2:46 PM  Resp 16 07/17/2018  2:46 PM  SpO2 96 % 07/17/2018  2:46 PM  Vitals shown include unvalidated device data.  Last Pain:  Vitals:   07/17/18 0933  TempSrc:   PainSc: 0-No pain      Patients Stated Pain Goal: 2 (88/87/57 9728)  Complications: No apparent anesthesia complications

## 2018-07-17 NOTE — H&P (Signed)
I examined the patient today and there is no change in the medical status 

## 2018-07-17 NOTE — Anesthesia Preprocedure Evaluation (Addendum)
Anesthesia Evaluation  Patient identified by MRN, date of birth, ID band Patient awake    Reviewed: Allergy & Precautions, NPO status , Patient's Chart, lab work & pertinent test results  Airway Mallampati: II  TM Distance: >3 FB Neck ROM: Full    Dental  (+) Chipped, Poor Dentition,    Pulmonary neg pulmonary ROS,    Pulmonary exam normal breath sounds clear to auscultation       Cardiovascular hypertension, Pt. on medications Normal cardiovascular exam Rhythm:Regular Rate:Normal  ECG: NSR, rate 85   Neuro/Psych negative neurological ROS  negative psych ROS   GI/Hepatic negative GI ROS, (+) Cirrhosis       , Hepatitis -, CEsophageal varices    Endo/Other  negative endocrine ROS  Renal/GU negative Renal ROS     Musculoskeletal negative musculoskeletal ROS (+)   Abdominal   Peds  Hematology Thrombocytopenia    Anesthesia Other Findings retinal detachment right eye  Reproductive/Obstetrics                            Anesthesia Physical Anesthesia Plan  ASA: III  Anesthesia Plan: General   Post-op Pain Management:    Induction: Intravenous  PONV Risk Score and Plan: 2 and Ondansetron, Dexamethasone, Midazolam and Treatment may vary due to age or medical condition  Airway Management Planned: Oral ETT  Additional Equipment:   Intra-op Plan:   Post-operative Plan: Extubation in OR  Informed Consent: I have reviewed the patients History and Physical, chart, labs and discussed the procedure including the risks, benefits and alternatives for the proposed anesthesia with the patient or authorized representative who has indicated his/her understanding and acceptance.   Dental advisory given  Plan Discussed with: CRNA  Anesthesia Plan Comments:        Anesthesia Quick Evaluation

## 2018-07-17 NOTE — Anesthesia Postprocedure Evaluation (Signed)
Anesthesia Post Note  Patient: PREM COYKENDALL  Procedure(s) Performed: SCLERAL BUCKLE RIGHT EYE WITH GAS INJECTION (Right Eye) PHOTOCOAGULATION WITH LASER (Right Eye)     Patient location during evaluation: PACU Anesthesia Type: General Level of consciousness: awake and alert Pain management: pain level controlled Vital Signs Assessment: post-procedure vital signs reviewed and stable Respiratory status: spontaneous breathing, nonlabored ventilation, respiratory function stable and patient connected to nasal cannula oxygen Cardiovascular status: blood pressure returned to baseline and stable Postop Assessment: no apparent nausea or vomiting Anesthetic complications: no    Last Vitals:  Vitals:   07/17/18 1659 07/17/18 1953  BP: 137/81 137/88  Pulse: 91 94  Resp: 16 20  Temp: 37.2 C 36.8 C  SpO2: 99% 96%    Last Pain:  Vitals:   07/17/18 1953  TempSrc: Oral  PainSc:                  Ryan P Ellender

## 2018-07-17 NOTE — Op Note (Signed)
NAME: Joshua Burton, Joshua J. MEDICAL RECORD [id number] ACCOUNT [id number] DATE OF BIRTH:[birth year] FACILITY: [facility] LOCATION: [place], [place]  OPERATIVE REPORT  DATE OF PROCEDURE:  07/17/2018  ADMISSION DIAGNOSIS:  Rhegmatogenous retinal detachment in the right eye.  PROCEDURES:  Scleral buckle, right eye; gas fluid exchange, right eye; retinal photocoagulation, right eye.  SURGEON:  Tempie Hoist, MD  ASSISTANT:  Deatra Ina, SA  ANESTHESIA:  General.  DESCRIPTION OF PROCEDURE:  Usual prep and drape.  A 360-degree limbal peritomy isolation of 4 rectus muscles on 2-0 silk.  Scleral dissection for 360 degrees to admit a #279 intrascleral implant with 1 mm trimmed from the posterior edge.  A 508G radial  segment was fashioned and placed at 2 o'clock.  The diathermy was placed in the scleral bed.  Two sutures per quadrant for a total of 8 scleral sutures were placed in the scleral flaps.  These were 4-0 [place].  A 279 implant was placed around the  globe with a joint at 4 o'clock.  A 240 band was placed around the eye with a 270 sleeve at 4 o'clock.  Perforation site chosen at 2 o'clock.  A large amount of clear colorless subretinal fluid came forth in a controlled manner.  There was no bleeding.   The subretinal fluid was clear.  The scleral sutures were drawn securely as the fluid egressed.  C3F8 of 0.9 mL 100% was injected through the 12 o'clock pars plana into the vitreous cavity.  The scleral sutures were drawn securely.  The indirect  ophthalmoscope laser was moved into place.  Burns of 1022 were placed around the retinal periphery.  The power was between 400 and 1400 milliwatts, 1000 microns each and 0.1 seconds each.  The breaks were well supported at 2 o'clock.  The buckle was  adjusted and trimmed.  The band was adjusted and trimmed.  The scleral sutures were pulled securely and nodded.  Paracentesis x2 obtained to closing pressure of 10 mm with a [place]  tonometer.  The conjunctiva was reposited with 7-0 chromic suture.   Polymyxin and ceftazidime were rinsed around the globe for antibiotic coverage.  Decadron 10 mg was injected into the lower subconjunctival space.  Atropine solution was applied.  Marcaine was injected around the globe for postop pain.  Polysporin  ophthalmic ointment, a patch and a shield were placed.  The patient was awakened and taken to recovery in satisfactory condition.  COMPLICATIONS:  None.  DURATION:  2-1/2 hours.  LN/NUANCE  D:07/17/2018 T:07/17/2018 JOB:[id number]

## 2018-07-17 NOTE — Brief Op Note (Signed)
Brief Operative note   Preoperative diagnosis:  retinal detachment right eye Postoperative diagnosis  * No Diagnosis Codes entered *  Procedures: Scleral buckle, laser, gas injection right eye  Surgeon:  Hayden Pedro, MD...  Assistant:  Deatra Ina SA    Anesthesia: General  Specimen: none  Estimated blood loss:  1cc  Complications: none  Patient sent to PACU in good condition  Composed by Hayden Pedro MD  Dictation number: [id number]

## 2018-07-17 NOTE — Anesthesia Procedure Notes (Signed)
Procedure Name: Intubation Date/Time: 07/17/2018 11:48 AM Performed by: Marsa Aris, CRNA Pre-anesthesia Checklist: Patient identified, Emergency Drugs available, Suction available and Patient being monitored Patient Re-evaluated:Patient Re-evaluated prior to induction Oxygen Delivery Method: Circle System Utilized Preoxygenation: Pre-oxygenation with 100% oxygen Induction Type: IV induction Ventilation: Mask ventilation without difficulty Laryngoscope Size: Miller and 2 Grade View: Grade II Tube type: Oral Number of attempts: 1 Airway Equipment and Method: Stylet and Oral airway Placement Confirmation: ETT inserted through vocal cords under direct vision,  positive ETCO2 and breath sounds checked- equal and bilateral Secured at: 22 cm Tube secured with: Tape Dental Injury: Teeth and Oropharynx as per pre-operative assessment  Comments: No change in dentition from pre-procedure.

## 2018-07-18 ENCOUNTER — Encounter (HOSPITAL_COMMUNITY): Payer: Self-pay | Admitting: Ophthalmology

## 2018-07-18 DIAGNOSIS — K766 Portal hypertension: Secondary | ICD-10-CM | POA: Diagnosis not present

## 2018-07-18 DIAGNOSIS — M1990 Unspecified osteoarthritis, unspecified site: Secondary | ICD-10-CM | POA: Diagnosis not present

## 2018-07-18 DIAGNOSIS — B1920 Unspecified viral hepatitis C without hepatic coma: Secondary | ICD-10-CM | POA: Diagnosis not present

## 2018-07-18 DIAGNOSIS — H33021 Retinal detachment with multiple breaks, right eye: Secondary | ICD-10-CM | POA: Diagnosis not present

## 2018-07-18 DIAGNOSIS — M869 Osteomyelitis, unspecified: Secondary | ICD-10-CM | POA: Diagnosis not present

## 2018-07-18 DIAGNOSIS — Z96641 Presence of right artificial hip joint: Secondary | ICD-10-CM | POA: Diagnosis not present

## 2018-07-18 DIAGNOSIS — I10 Essential (primary) hypertension: Secondary | ICD-10-CM | POA: Diagnosis not present

## 2018-07-18 DIAGNOSIS — I8510 Secondary esophageal varices without bleeding: Secondary | ICD-10-CM | POA: Diagnosis not present

## 2018-07-18 DIAGNOSIS — K7030 Alcoholic cirrhosis of liver without ascites: Secondary | ICD-10-CM | POA: Diagnosis not present

## 2018-07-18 MED ORDER — HYDROCODONE-ACETAMINOPHEN 5-325 MG PO TABS: 1.0000 | ORAL_TABLET | ORAL | 0 refills | Status: DC | PRN

## 2018-07-18 MED ORDER — BACITRACIN-POLYMYXIN B 500-10000 UNIT/GM OP OINT: 1.0000 "application " | TOPICAL_OINTMENT | Freq: Three times a day (TID) | OPHTHALMIC | 0 refills | Status: DC

## 2018-07-18 MED ORDER — PREDNISOLONE ACETATE 1 % OP SUSP: 1.0000 [drp] | Freq: Four times a day (QID) | OPHTHALMIC | 0 refills | Status: DC

## 2018-07-18 MED ORDER — BRIMONIDINE TARTRATE 0.2 % OP SOLN: 1.0000 [drp] | Freq: Two times a day (BID) | OPHTHALMIC | 12 refills | Status: DC

## 2018-07-18 MED ORDER — GATIFLOXACIN 0.5 % OP SOLN: 1.0000 [drp] | Freq: Four times a day (QID) | OPHTHALMIC | Status: DC

## 2018-07-18 NOTE — Progress Notes (Signed)
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  Discharge to home.  Give post operative instructions  Joshua Burton 07/18/2018, 6:39 AM

## 2018-07-18 NOTE — Progress Notes (Signed)
Omid J Mesta to be D/C'd  per MD order. Discussed with the patient and all questions fully answered.  VSS, Skin clean, dry and intact without evidence of skin break down, no evidence of skin tears noted.  IV catheter discontinued intact. Site without signs and symptoms of complications. Dressing and pressure applied.  An After Visit Summary was printed and given to the patient. Patient received prescription.  D/c education completed with patient/family including follow up instructions, medication list, d/c activities limitations if indicated, with other d/c instructions as indicated by MD - patient able to verbalize understanding, all questions fully answered.   Patient instructed to return to ED, call 911, or call MD for any changes in condition.   Patient to be escorted via WC, and D/C home via private auto. 

## 2018-07-18 NOTE — Discharge Summary (Signed)
Discharge summary not needed on [facility] patients per medical records. 

## 2018-07-24 ENCOUNTER — Encounter (INDEPENDENT_AMBULATORY_CARE_PROVIDER_SITE_OTHER): Payer: Medicare HMO | Admitting: Ophthalmology

## 2018-07-24 DIAGNOSIS — H338 Other retinal detachments: Secondary | ICD-10-CM

## 2018-08-14 ENCOUNTER — Encounter (INDEPENDENT_AMBULATORY_CARE_PROVIDER_SITE_OTHER): Payer: Medicare HMO | Admitting: Ophthalmology

## 2018-08-14 DIAGNOSIS — H338 Other retinal detachments: Secondary | ICD-10-CM

## 2018-08-14 DIAGNOSIS — H33301 Unspecified retinal break, right eye: Secondary | ICD-10-CM

## 2018-08-28 ENCOUNTER — Encounter (INDEPENDENT_AMBULATORY_CARE_PROVIDER_SITE_OTHER): Payer: Medicare HMO | Admitting: Ophthalmology

## 2018-08-28 DIAGNOSIS — H338 Other retinal detachments: Secondary | ICD-10-CM

## 2018-09-11 DIAGNOSIS — L570 Actinic keratosis: Secondary | ICD-10-CM | POA: Diagnosis not present

## 2018-09-11 DIAGNOSIS — L218 Other seborrheic dermatitis: Secondary | ICD-10-CM | POA: Diagnosis not present

## 2018-09-11 DIAGNOSIS — D225 Melanocytic nevi of trunk: Secondary | ICD-10-CM | POA: Diagnosis not present

## 2018-09-11 DIAGNOSIS — D485 Neoplasm of uncertain behavior of skin: Secondary | ICD-10-CM | POA: Diagnosis not present

## 2018-09-11 DIAGNOSIS — C44319 Basal cell carcinoma of skin of other parts of face: Secondary | ICD-10-CM | POA: Diagnosis not present

## 2018-09-11 DIAGNOSIS — X32XXXD Exposure to sunlight, subsequent encounter: Secondary | ICD-10-CM | POA: Diagnosis not present

## 2018-09-14 ENCOUNTER — Ambulatory Visit: Payer: Medicare HMO | Admitting: Endocrinology

## 2018-09-14 ENCOUNTER — Encounter: Payer: Self-pay | Admitting: Endocrinology

## 2018-09-14 VITALS — BP 160/88 | HR 90 | Ht 71.5 in | Wt 226.6 lb

## 2018-09-14 DIAGNOSIS — N62 Hypertrophy of breast: Secondary | ICD-10-CM

## 2018-09-14 LAB — TSH: TSH: 2.88 u[IU]/mL (ref 0.35–4.50)

## 2018-09-14 LAB — LUTEINIZING HORMONE: LH: 19.2 m[IU]/mL — ABNORMAL HIGH (ref 1.50–9.30)

## 2018-09-14 MED ORDER — SILDENAFIL CITRATE 100 MG PO TABS: 50.0000 mg | ORAL_TABLET | Freq: Every day | ORAL | 11 refills | Status: AC | PRN

## 2018-09-14 NOTE — Patient Instructions (Addendum)
Your blood pressure is high today.  Please see your primary care provider soon, to have it rechecked. Changing the clonidine to another medication might help the ED symptoms, also.  blood tests are requested for you today.  We'll let you know about the results.   Losing weight helps, also.  I have sent a prescription to your pharmacy, for the ED symptoms.   Please come back for a follow-up appointment in 1 year.
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
## 2018-09-16 LAB — TESTOSTERONE,FREE AND TOTAL
TESTOSTERONE: 389 ng/dL (ref 264–916)
Testosterone, Free: 13.9 pg/mL (ref 6.6–18.1)

## 2018-09-17 ENCOUNTER — Encounter (INDEPENDENT_AMBULATORY_CARE_PROVIDER_SITE_OTHER): Payer: Medicare HMO | Admitting: Ophthalmology

## 2018-09-17 ENCOUNTER — Other Ambulatory Visit: Payer: Self-pay

## 2018-09-17 ENCOUNTER — Encounter (HOSPITAL_COMMUNITY): Payer: Self-pay | Admitting: *Deleted

## 2018-09-17 DIAGNOSIS — H338 Other retinal detachments: Secondary | ICD-10-CM

## 2018-09-17 NOTE — Progress Notes (Signed)
Spoke with pt for pre-op call. Pt denies cardiac history or diabetes.  
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
## 2018-09-18 ENCOUNTER — Encounter (HOSPITAL_COMMUNITY)
Admission: RE | Disposition: A | Discharge status: Home / Self Care | Payer: Self-pay | Source: Ambulatory Visit | Attending: Ophthalmology

## 2018-09-18 ENCOUNTER — Other Ambulatory Visit: Payer: Self-pay

## 2018-09-18 ENCOUNTER — Ambulatory Visit (HOSPITAL_COMMUNITY)
Admission: RE | Admit: 2018-09-18 | Discharge: 2018-09-19 | Disposition: A | Discharge status: Home / Self Care | Payer: Medicare HMO | Source: Ambulatory Visit | Attending: Ophthalmology | Admitting: Ophthalmology

## 2018-09-18 ENCOUNTER — Ambulatory Visit (HOSPITAL_COMMUNITY): Payer: Medicare HMO | Admitting: Certified Registered"

## 2018-09-18 ENCOUNTER — Encounter (HOSPITAL_COMMUNITY): Payer: Self-pay

## 2018-09-18 DIAGNOSIS — D649 Anemia, unspecified: Secondary | ICD-10-CM | POA: Insufficient documentation

## 2018-09-18 DIAGNOSIS — Z96641 Presence of right artificial hip joint: Secondary | ICD-10-CM | POA: Diagnosis not present

## 2018-09-18 DIAGNOSIS — I10 Essential (primary) hypertension: Secondary | ICD-10-CM | POA: Diagnosis not present

## 2018-09-18 DIAGNOSIS — K7030 Alcoholic cirrhosis of liver without ascites: Secondary | ICD-10-CM | POA: Diagnosis not present

## 2018-09-18 DIAGNOSIS — H338 Other retinal detachments: Secondary | ICD-10-CM

## 2018-09-18 DIAGNOSIS — Z8249 Family history of ischemic heart disease and other diseases of the circulatory system: Secondary | ICD-10-CM | POA: Diagnosis not present

## 2018-09-18 DIAGNOSIS — M869 Osteomyelitis, unspecified: Secondary | ICD-10-CM | POA: Diagnosis not present

## 2018-09-18 DIAGNOSIS — B1920 Unspecified viral hepatitis C without hepatic coma: Secondary | ICD-10-CM | POA: Diagnosis not present

## 2018-09-18 DIAGNOSIS — Z9049 Acquired absence of other specified parts of digestive tract: Secondary | ICD-10-CM | POA: Diagnosis not present

## 2018-09-18 DIAGNOSIS — H33011 Retinal detachment with single break, right eye: Secondary | ICD-10-CM | POA: Insufficient documentation

## 2018-09-18 DIAGNOSIS — Z9841 Cataract extraction status, right eye: Secondary | ICD-10-CM | POA: Diagnosis not present

## 2018-09-18 DIAGNOSIS — H33001 Unspecified retinal detachment with retinal break, right eye: Secondary | ICD-10-CM | POA: Diagnosis not present

## 2018-09-18 DIAGNOSIS — I8510 Secondary esophageal varices without bleeding: Secondary | ICD-10-CM | POA: Diagnosis not present

## 2018-09-18 DIAGNOSIS — M1990 Unspecified osteoarthritis, unspecified site: Secondary | ICD-10-CM | POA: Insufficient documentation

## 2018-09-18 DIAGNOSIS — Z9842 Cataract extraction status, left eye: Secondary | ICD-10-CM | POA: Insufficient documentation

## 2018-09-18 DIAGNOSIS — Z8261 Family history of arthritis: Secondary | ICD-10-CM | POA: Diagnosis not present

## 2018-09-18 DIAGNOSIS — D696 Thrombocytopenia, unspecified: Secondary | ICD-10-CM | POA: Diagnosis not present

## 2018-09-18 DIAGNOSIS — K766 Portal hypertension: Secondary | ICD-10-CM | POA: Diagnosis not present

## 2018-09-18 HISTORY — PX: PARS PLANA VITRECTOMY: SHX2166

## 2018-09-18 LAB — CBC
HCT: 46.8 % (ref 39.0–52.0)
Hemoglobin: 15.4 g/dL (ref 13.0–17.0)
MCH: 33 pg (ref 26.0–34.0)
MCHC: 32.9 g/dL (ref 30.0–36.0)
MCV: 100.2 fL — ABNORMAL HIGH (ref 80.0–100.0)
NRBC: 0 % (ref 0.0–0.2)
Platelets: 164 10*3/uL (ref 150–400)
RBC: 4.67 MIL/uL (ref 4.22–5.81)
RDW: 13.1 % (ref 11.5–15.5)
WBC: 6.6 10*3/uL (ref 4.0–10.5)

## 2018-09-18 LAB — COMPREHENSIVE METABOLIC PANEL
ALT: 27 U/L (ref 0–44)
AST: 30 U/L (ref 15–41)
Albumin: 4.2 g/dL (ref 3.5–5.0)
Alkaline Phosphatase: 62 U/L (ref 38–126)
Anion gap: 7 (ref 5–15)
BILIRUBIN TOTAL: 0.8 mg/dL (ref 0.3–1.2)
BUN: 14 mg/dL (ref 8–23)
CO2: 24 mmol/L (ref 22–32)
CREATININE: 0.95 mg/dL (ref 0.61–1.24)
Calcium: 9.4 mg/dL (ref 8.9–10.3)
Chloride: 104 mmol/L (ref 98–111)
GFR calc Af Amer: >60 mL/min (ref >60)
GFR calc non Af Amer: >60 mL/min (ref >60)
Glucose, Bld: 105 mg/dL — ABNORMAL HIGH (ref 70–99)
POTASSIUM: 4.8 mmol/L (ref 3.5–5.1)
Sodium: 135 mmol/L (ref 135–145)
TOTAL PROTEIN: 7.1 g/dL (ref 6.5–8.1)

## 2018-09-18 SURGERY — PARS PLANA VITRECTOMY WITH 25 GAUGE
Anesthesia: General | Site: Eye | Laterality: Right

## 2018-09-18 MED ORDER — STERILE WATER FOR INJECTION IJ SOLN
INTRAMUSCULAR | Status: AC
  Filled 2018-09-18: qty 20

## 2018-09-18 MED ORDER — ACETAMINOPHEN 325 MG PO TABS: 325.0000 mg | ORAL_TABLET | ORAL | Status: DC | PRN

## 2018-09-18 MED ORDER — FENTANYL CITRATE (PF) 100 MCG/2ML IJ SOLN
INTRAMUSCULAR | Status: AC
  Filled 2018-09-18: qty 2

## 2018-09-18 MED ORDER — BACITRACIN-POLYMYXIN B 500-10000 UNIT/GM OP OINT
TOPICAL_OINTMENT | OPHTHALMIC | Status: AC
  Filled 2018-09-18: qty 3.5

## 2018-09-18 MED ORDER — CYCLOPENTOLATE HCL 1 % OP SOLN
1.0000 [drp] | OPHTHALMIC | Status: AC | PRN
  Administered 2018-09-18 (×3): 1 [drp] via OPHTHALMIC
  Filled 2018-09-18: qty 2

## 2018-09-18 MED ORDER — LISINOPRIL 5 MG PO TABS
5.0000 mg | ORAL_TABLET | Freq: Two times a day (BID) | ORAL | Status: DC
  Administered 2018-09-18: 5 mg via ORAL
  Filled 2018-09-18: qty 1

## 2018-09-18 MED ORDER — TROPICAMIDE 1 % OP SOLN
1.0000 [drp] | OPHTHALMIC | Status: AC | PRN
  Administered 2018-09-18 (×3): 1 [drp] via OPHTHALMIC
  Filled 2018-09-18: qty 15

## 2018-09-18 MED ORDER — LIDOCAINE 2% (20 MG/ML) 5 ML SYRINGE
INTRAMUSCULAR | Status: AC
  Filled 2018-09-18: qty 5

## 2018-09-18 MED ORDER — OXYCODONE-ACETAMINOPHEN 5-325 MG PO TABS
ORAL_TABLET | ORAL | Status: AC
  Filled 2018-09-18: qty 2

## 2018-09-18 MED ORDER — HYDROMORPHONE HCL 1 MG/ML IJ SOLN
0.2500 mg | INTRAMUSCULAR | Status: DC | PRN
  Administered 2018-09-18 (×4): 0.5 mg via INTRAVENOUS

## 2018-09-18 MED ORDER — SUGAMMADEX SODIUM 200 MG/2ML IV SOLN
INTRAVENOUS | Status: AC
  Filled 2018-09-18: qty 2

## 2018-09-18 MED ORDER — POLYMYXIN B SULFATE 500000 UNITS IJ SOLR
INTRAMUSCULAR | Status: AC
  Filled 2018-09-18: qty 500000

## 2018-09-18 MED ORDER — ATROPINE SULFATE 1 % OP SOLN
OPHTHALMIC | Status: AC
  Filled 2018-09-18: qty 5

## 2018-09-18 MED ORDER — DEXAMETHASONE SODIUM PHOSPHATE 10 MG/ML IJ SOLN
INTRAMUSCULAR | Status: DC | PRN
  Administered 2018-09-18: 10 mg

## 2018-09-18 MED ORDER — DEXAMETHASONE SODIUM PHOSPHATE 10 MG/ML IJ SOLN
INTRAMUSCULAR | Status: DC | PRN
  Administered 2018-09-18: 10 mg via INTRAVENOUS

## 2018-09-18 MED ORDER — CLONIDINE HCL 0.2 MG PO TABS
0.2000 mg | ORAL_TABLET | Freq: Two times a day (BID) | ORAL | Status: DC
  Administered 2018-09-18: 0.2 mg via ORAL
  Filled 2018-09-18: qty 1

## 2018-09-18 MED ORDER — DEXAMETHASONE SODIUM PHOSPHATE 10 MG/ML IJ SOLN
INTRAMUSCULAR | Status: AC
  Filled 2018-09-18: qty 1

## 2018-09-18 MED ORDER — SODIUM HYALURONATE 10 MG/ML IO SOLN
INTRAOCULAR | Status: AC
  Filled 2018-09-18: qty 0.85

## 2018-09-18 MED ORDER — STERILE WATER FOR INJECTION IJ SOLN
INTRAMUSCULAR | Status: DC | PRN
  Administered 2018-09-18: 20 mL

## 2018-09-18 MED ORDER — CEFTAZIDIME 1 G IJ SOLR
INTRAMUSCULAR | Status: AC
  Filled 2018-09-18: qty 1

## 2018-09-18 MED ORDER — SODIUM HYALURONATE 10 MG/ML IO SOLN
INTRAOCULAR | Status: DC | PRN
  Administered 2018-09-18: 0.85 mL via INTRAOCULAR

## 2018-09-18 MED ORDER — MELOXICAM 7.5 MG PO TABS: 15.0000 mg | ORAL_TABLET | Freq: Every day | ORAL | Status: DC

## 2018-09-18 MED ORDER — BRIMONIDINE TARTRATE 0.2 % OP SOLN
1.0000 [drp] | Freq: Two times a day (BID) | OPHTHALMIC | Status: DC
  Filled 2018-09-18: qty 5

## 2018-09-18 MED ORDER — MIDAZOLAM HCL 2 MG/2ML IJ SOLN
INTRAMUSCULAR | Status: AC
  Filled 2018-09-18: qty 2

## 2018-09-18 MED ORDER — BACITRACIN-POLYMYXIN B 500-10000 UNIT/GM OP OINT
TOPICAL_OINTMENT | OPHTHALMIC | Status: DC | PRN
  Administered 2018-09-18: 1 via OPHTHALMIC

## 2018-09-18 MED ORDER — BACITRACIN-POLYMYXIN B 500-10000 UNIT/GM OP OINT
1.0000 "application " | TOPICAL_OINTMENT | Freq: Three times a day (TID) | OPHTHALMIC | Status: DC
  Filled 2018-09-18: qty 3.5

## 2018-09-18 MED ORDER — DORZOLAMIDE HCL 2 % OP SOLN
1.0000 [drp] | Freq: Three times a day (TID) | OPHTHALMIC | Status: DC
  Filled 2018-09-18: qty 10

## 2018-09-18 MED ORDER — TETRACAINE HCL 0.5 % OP SOLN
2.0000 [drp] | Freq: Once | OPHTHALMIC | Status: DC
  Filled 2018-09-18: qty 4

## 2018-09-18 MED ORDER — TRIAMCINOLONE ACETONIDE 0.1 % EX CREA: 1.0000 "application " | TOPICAL_CREAM | Freq: Two times a day (BID) | CUTANEOUS | Status: DC | PRN

## 2018-09-18 MED ORDER — LATANOPROST 0.005 % OP SOLN
1.0000 [drp] | Freq: Every day | OPHTHALMIC | Status: DC
  Filled 2018-09-18: qty 2.5

## 2018-09-18 MED ORDER — MAGNESIUM HYDROXIDE 400 MG/5ML PO SUSP: 15.0000 mL | Freq: Four times a day (QID) | ORAL | Status: DC | PRN

## 2018-09-18 MED ORDER — ONDANSETRON HCL 4 MG/2ML IJ SOLN
INTRAMUSCULAR | Status: DC | PRN
  Administered 2018-09-18 (×2): 4 mg via INTRAVENOUS

## 2018-09-18 MED ORDER — BSS PLUS IO SOLN
INTRAOCULAR | Status: AC
  Filled 2018-09-18: qty 500

## 2018-09-18 MED ORDER — CEFAZOLIN SODIUM-DEXTROSE 2-4 GM/100ML-% IV SOLN
2.0000 g | INTRAVENOUS | Status: AC
  Administered 2018-09-18: 2 g via INTRAVENOUS

## 2018-09-18 MED ORDER — GATIFLOXACIN 0.5 % OP SOLN
1.0000 [drp] | Freq: Four times a day (QID) | OPHTHALMIC | Status: DC
  Filled 2018-09-18: qty 2.5

## 2018-09-18 MED ORDER — BUPIVACAINE HCL (PF) 0.75 % IJ SOLN
INTRAMUSCULAR | Status: AC
  Filled 2018-09-18: qty 10

## 2018-09-18 MED ORDER — BSS IO SOLN
INTRAOCULAR | Status: AC
  Filled 2018-09-18: qty 15

## 2018-09-18 MED ORDER — BSS IO SOLN
INTRAOCULAR | Status: DC | PRN
  Administered 2018-09-18: 15 mL

## 2018-09-18 MED ORDER — SODIUM CHLORIDE (PF) 0.9 % IJ SOLN
INTRAMUSCULAR | Status: AC
  Filled 2018-09-18: qty 10

## 2018-09-18 MED ORDER — PREDNISOLONE ACETATE 1 % OP SUSP
1.0000 [drp] | Freq: Four times a day (QID) | OPHTHALMIC | Status: DC
  Filled 2018-09-18: qty 5

## 2018-09-18 MED ORDER — SODIUM CHLORIDE 0.9 % IV SOLN
INTRAVENOUS | Status: DC
  Administered 2018-09-18 (×2): via INTRAVENOUS

## 2018-09-18 MED ORDER — ROCURONIUM BROMIDE 50 MG/5ML IV SOSY
PREFILLED_SYRINGE | INTRAVENOUS | Status: DC | PRN
  Administered 2018-09-18: 40 mg via INTRAVENOUS

## 2018-09-18 MED ORDER — ATROPINE SULFATE 1 % OP SOLN
OPHTHALMIC | Status: DC | PRN
  Administered 2018-09-18: 1 [drp] via OPHTHALMIC

## 2018-09-18 MED ORDER — FENTANYL CITRATE (PF) 100 MCG/2ML IJ SOLN
25.0000 ug | INTRAMUSCULAR | Status: DC | PRN
  Administered 2018-09-18 (×3): 50 ug via INTRAVENOUS

## 2018-09-18 MED ORDER — PHENYLEPHRINE HCL 2.5 % OP SOLN
1.0000 [drp] | OPHTHALMIC | Status: AC | PRN
  Administered 2018-09-18 (×3): 1 [drp] via OPHTHALMIC
  Filled 2018-09-18: qty 2

## 2018-09-18 MED ORDER — HYDROCODONE-ACETAMINOPHEN 5-325 MG PO TABS
ORAL_TABLET | ORAL | Status: AC
  Filled 2018-09-18: qty 2

## 2018-09-18 MED ORDER — EPINEPHRINE PF 1 MG/ML IJ SOLN
INTRAMUSCULAR | Status: AC
  Filled 2018-09-18: qty 1

## 2018-09-18 MED ORDER — ACETAZOLAMIDE SODIUM 500 MG IJ SOLR
500.0000 mg | Freq: Once | INTRAMUSCULAR | Status: AC
  Administered 2018-09-19: 500 mg via INTRAVENOUS
  Filled 2018-09-18: qty 500

## 2018-09-18 MED ORDER — 0.9 % SODIUM CHLORIDE (POUR BTL) OPTIME
TOPICAL | Status: DC | PRN
  Administered 2018-09-18: 1000 mL

## 2018-09-18 MED ORDER — BUPIVACAINE HCL (PF) 0.75 % IJ SOLN
INTRAMUSCULAR | Status: DC | PRN
  Administered 2018-09-18: 10 mL

## 2018-09-18 MED ORDER — TEMAZEPAM 15 MG PO CAPS: 15.0000 mg | ORAL_CAPSULE | Freq: Every evening | ORAL | Status: DC | PRN

## 2018-09-18 MED ORDER — HYDROMORPHONE HCL 1 MG/ML IJ SOLN
INTRAMUSCULAR | Status: AC
  Filled 2018-09-18: qty 2

## 2018-09-18 MED ORDER — MIDAZOLAM HCL 2 MG/2ML IJ SOLN
INTRAMUSCULAR | Status: DC | PRN
  Administered 2018-09-18: 2 mg via INTRAVENOUS

## 2018-09-18 MED ORDER — HYDROCODONE-ACETAMINOPHEN 5-325 MG PO TABS
1.0000 | ORAL_TABLET | ORAL | Status: DC | PRN
  Administered 2018-09-18 – 2018-09-19 (×4): 2 via ORAL
  Filled 2018-09-18 (×3): qty 2

## 2018-09-18 MED ORDER — LIDOCAINE 2% (20 MG/ML) 5 ML SYRINGE
INTRAMUSCULAR | Status: DC | PRN
  Administered 2018-09-18: 100 mg via INTRAVENOUS

## 2018-09-18 MED ORDER — SUGAMMADEX SODIUM 200 MG/2ML IV SOLN
INTRAVENOUS | Status: DC | PRN
  Administered 2018-09-18: 200 mg via INTRAVENOUS

## 2018-09-18 MED ORDER — FENTANYL CITRATE (PF) 250 MCG/5ML IJ SOLN
INTRAMUSCULAR | Status: AC
  Filled 2018-09-18: qty 5

## 2018-09-18 MED ORDER — SODIUM CHLORIDE 0.45 % IV SOLN
INTRAVENOUS | Status: DC
  Administered 2018-09-18: 19:00:00 via INTRAVENOUS

## 2018-09-18 MED ORDER — EPINEPHRINE PF 1 MG/ML IJ SOLN
INTRAOCULAR | Status: DC | PRN
  Administered 2018-09-18: 15:00:00

## 2018-09-18 MED ORDER — GATIFLOXACIN 0.5 % OP SOLN
1.0000 [drp] | OPHTHALMIC | Status: AC | PRN
  Administered 2018-09-18 (×3): 1 [drp] via OPHTHALMIC
  Filled 2018-09-18: qty 2.5

## 2018-09-18 MED ORDER — ONDANSETRON HCL 4 MG/2ML IJ SOLN: 4.0000 mg | Freq: Four times a day (QID) | INTRAMUSCULAR | Status: DC | PRN

## 2018-09-18 MED ORDER — SODIUM CHLORIDE 0.9 % IV SOLN
INTRAVENOUS | Status: DC | PRN
  Administered 2018-09-18: 20 ug/min via INTRAVENOUS

## 2018-09-18 MED ORDER — MORPHINE SULFATE (PF) 2 MG/ML IV SOLN
1.0000 mg | INTRAVENOUS | Status: AC | PRN
  Administered 2018-09-18 (×2): 2 mg via INTRAVENOUS
  Filled 2018-09-18 (×2): qty 1

## 2018-09-18 MED ORDER — CEFAZOLIN SODIUM-DEXTROSE 2-4 GM/100ML-% IV SOLN
INTRAVENOUS | Status: AC
  Filled 2018-09-18: qty 100

## 2018-09-18 MED ORDER — PROPOFOL 10 MG/ML IV BOLUS
INTRAVENOUS | Status: DC | PRN
  Administered 2018-09-18: 150 mg via INTRAVENOUS

## 2018-09-18 MED ORDER — FENTANYL CITRATE (PF) 100 MCG/2ML IJ SOLN
INTRAMUSCULAR | Status: DC | PRN
  Administered 2018-09-18: 100 ug via INTRAVENOUS
  Administered 2018-09-18 (×3): 50 ug via INTRAVENOUS

## 2018-09-18 MED ORDER — ONDANSETRON HCL 4 MG/2ML IJ SOLN
INTRAMUSCULAR | Status: AC
  Filled 2018-09-18: qty 2

## 2018-09-18 SURGICAL SUPPLY — 82 items
APL SRG 3 HI ABS STRL LF PLS (MISCELLANEOUS) ×4
APPLICATOR DR MATTHEWS STRL (MISCELLANEOUS) ×4 IMPLANT
BALL CTTN LRG ABS STRL LF (GAUZE/BANDAGES/DRESSINGS) ×3
BLADE EYE CATARACT 19 1.4 BEAV (BLADE) IMPLANT
BLADE MVR KNIFE 19G (BLADE) IMPLANT
BLADE MVR KNIFE 20G (BLADE) IMPLANT
CANNULA DUAL BORE 23G (CANNULA) IMPLANT
CANNULA DUALBORE 25G (CANNULA) ×2 IMPLANT
CANNULA INFUSION 6 (MISCELLANEOUS) ×2 IMPLANT
CANNULA VLV SOFT TIP 25G (OPHTHALMIC) ×1 IMPLANT
CANNULA VLV SOFT TIP 25GA (OPHTHALMIC) ×2 IMPLANT
CORDS BIPOLAR (ELECTRODE) IMPLANT
COTTONBALL LRG STERILE PKG (GAUZE/BANDAGES/DRESSINGS) ×6 IMPLANT
COVER MAYO STAND STRL (DRAPES) IMPLANT
COVER SURGICAL LIGHT HANDLE (MISCELLANEOUS) ×1 IMPLANT
COVER WAND RF STERILE (DRAPES) ×1 IMPLANT
DRAPE INCISE 51X51 W/FILM STRL (DRAPES) IMPLANT
DRAPE OPHTHALMIC 77X100 STRL (CUSTOM PROCEDURE TRAY) ×2 IMPLANT
FILTER BLUE MILLIPORE (MISCELLANEOUS) IMPLANT
FILTER STRAW FLUID ASPIR (MISCELLANEOUS) IMPLANT
FORCEPS ECKARDT ILM 25G SERR (OPHTHALMIC RELATED) IMPLANT
FORCEPS GRIESHABER ILM 25G A (INSTRUMENTS) IMPLANT
GAS AUTO FILL CONSTEL (OPHTHALMIC) ×2
GAS AUTO FILL CONSTELLATION (OPHTHALMIC) ×1 IMPLANT
GLOVE SS BIOGEL STRL SZ 6.5 (GLOVE) ×1 IMPLANT
GLOVE SS BIOGEL STRL SZ 7 (GLOVE) ×1 IMPLANT
GLOVE SUPERSENSE BIOGEL SZ 6.5 (GLOVE) ×1
GLOVE SUPERSENSE BIOGEL SZ 7 (GLOVE) ×1
GLOVE SURG 8.5 LATEX PF (GLOVE) ×2 IMPLANT
GOWN STRL REUS W/ TWL LRG LVL3 (GOWN DISPOSABLE) ×3 IMPLANT
GOWN STRL REUS W/TWL LRG LVL3 (GOWN DISPOSABLE) ×6
HANDLE PNEUMATIC FOR CONSTEL (OPHTHALMIC) IMPLANT
KIT BASIN OR (CUSTOM PROCEDURE TRAY) ×2 IMPLANT
KIT PERFLUORON PROCEDURE 5ML (MISCELLANEOUS) ×1 IMPLANT
KNIFE CRESCENT 2.5 55 ANG (BLADE) IMPLANT
KNIFE GRIESHABER SHARP 2.5MM (MISCELLANEOUS) ×1 IMPLANT
MICROPICK 25G (MISCELLANEOUS)
NDL 18GX1X1/2 (RX/OR ONLY) (NEEDLE) ×1 IMPLANT
NDL 25GX 5/8IN NON SAFETY (NEEDLE) IMPLANT
NDL FILTER BLUNT 18X1 1/2 (NEEDLE) ×1 IMPLANT
NDL HYPO 30X.5 LL (NEEDLE) IMPLANT
NEEDLE 18GX1X1/2 (RX/OR ONLY) (NEEDLE) ×2 IMPLANT
NEEDLE 25GX 5/8IN NON SAFETY (NEEDLE) IMPLANT
NEEDLE FILTER BLUNT 18X 1/2SAF (NEEDLE) ×1
NEEDLE FILTER BLUNT 18X1 1/2 (NEEDLE) ×1 IMPLANT
NEEDLE HYPO 30X.5 LL (NEEDLE) IMPLANT
NS IRRIG 1000ML POUR BTL (IV SOLUTION) ×2 IMPLANT
PACK VITRECTOMY CUSTOM (CUSTOM PROCEDURE TRAY) ×2 IMPLANT
PAD ARMBOARD 7.5X6 YLW CONV (MISCELLANEOUS) ×4 IMPLANT
PAK PIK VITRECTOMY CVS 25GA (OPHTHALMIC) ×2 IMPLANT
PENCIL BIPOLAR 25GA STR DISP (OPHTHALMIC RELATED) IMPLANT
PIC ILLUMINATED 25G (OPHTHALMIC) ×2
PICK MICROPICK 25G (MISCELLANEOUS) IMPLANT
PIK ILLUMINATED 25G (OPHTHALMIC) ×1 IMPLANT
PROBE LASER ILLUM FLEX CVD 25G (OPHTHALMIC) ×1 IMPLANT
REPL STRA BRUSH NDL (NEEDLE) IMPLANT
REPL STRA BRUSH NEEDLE (NEEDLE) IMPLANT
RESERVOIR BACK FLUSH (MISCELLANEOUS) IMPLANT
ROLLS DENTAL (MISCELLANEOUS) ×4 IMPLANT
SCISSORS TIP ADVANCED DSP 25GA (INSTRUMENTS) IMPLANT
SCRAPER DIAMOND 25GA (OPHTHALMIC RELATED) IMPLANT
SCRAPER DIAMOND DUST MEMBRANE (MISCELLANEOUS) IMPLANT
SPONGE GROOVED SILICONE 4X12X8 (Ophthalmic Related) ×1 IMPLANT
SPONGE SURGIFOAM ABS GEL 12-7 (HEMOSTASIS) ×2 IMPLANT
STOPCOCK 4 WAY LG BORE MALE ST (IV SETS) IMPLANT
SUT CHROMIC 7 0 TG140 8 (SUTURE) ×1 IMPLANT
SUT ETHILON 10 0 CS140 6 (SUTURE) ×1 IMPLANT
SUT ETHILON 9 0 TG140 8 (SUTURE) IMPLANT
SUT MERSILENE 4 0 RV 2 (SUTURE) ×1 IMPLANT
SUT POLY NON ABSORB 10-0 8 STR (SUTURE) IMPLANT
SUT SILK 2 0 (SUTURE) ×2
SUT SILK 2-0 18XBRD TIE 12 (SUTURE) IMPLANT
SUT SILK 4 0 RB 1 (SUTURE) ×2 IMPLANT
SYR 10ML LL (SYRINGE) ×1 IMPLANT
SYR 20CC LL (SYRINGE) ×2 IMPLANT
SYR 5ML LL (SYRINGE) IMPLANT
SYR BULB 3OZ (MISCELLANEOUS) ×2 IMPLANT
SYR TB 1ML LUER SLIP (SYRINGE) ×2 IMPLANT
TOWEL OR 17X24 6PK STRL BLUE (TOWEL DISPOSABLE) ×2 IMPLANT
TUBING HIGH PRESS EXTEN 6IN (TUBING) IMPLANT
WATER STERILE IRR 1000ML POUR (IV SOLUTION) ×2 IMPLANT
WIPE INSTRUMENT VISIWIPE 73X73 (MISCELLANEOUS) IMPLANT

## 2018-09-18 NOTE — Op Note (Signed)
NAME: Joshua Burton, Joshua Burton. MEDICAL RECORD OA:[id number] ACCOUNT [id number] DATE OF BIRTH:January 29, 1951 FACILITY: [facility] LOCATION: [facility] PHYSICIAN:JOHN D. MATTHEWS, MD  OPERATIVE REPORT  DATE OF PROCEDURE:  09/18/2018  ADMISSION DIAGNOSIS:  Rhegmatogenous retinal detachment, right eye.  PROCEDURES:  Revision of scleral buckle with addition of radial element right eye, pars plana vitrectomy right eye, membrane peel right eye, repair of complex traction retinal detachment right eye, perfluoron injection and perfluoron removal, right eye.   C3F8 gas mixture injection right eye and laser right eye.  SURGEON:  Tempie Hoist, MD  ASSISTANT:  Deatra Ina.  ANESTHESIA:  General.  DESCRIPTION OF PROCEDURE:  Usual prep and drape.  The superior scleral buckle was opened.  There was conjunctival peritomy from 9 o'clock to 2 o'clock with posterior dissection down to the buckle.  The mersilene sutures were removed around the superior  rectus muscles so the buckle could be opened.  Additional posterior dissection was carried out in this region which was beneath the posterior horseshoe tear at 12 o'clock.  Once the dissection was carried out, cryopexy was performed.  A 508G radial  segment was placed beneath the existing scleral buckle at 12:00.  A 4-0 Mersilene suture was placed in the scleral flaps and it was closed.  Attention was carried to the pars plana area where vitrectomy was begun.  The 25 gauge trocars were placed at 8,  10 and 2 o'clock with an infusion at 8 o'clock.  The lighted pick was placed at 10 o'clock and the cutter was placed at 2 o'clock.  Pars plana vitrectomy was performed beginning behind the pseudophacos.  Provisc was placed on the corneal surface.  The  BIOM viewing system was moved into place.  The vitrectomy was carried posteriorly in a core fashion to remove all core vitreous.  The vitrectomy was carried into the periphery and membranes were seen on the retinal surface and in the  retinal periphery.   These membranes were stripped with the vitreous cutter and silicone suction tip.  Forceps and the lighted pick were used to grab the membranes and peel them.  Multiple horseshoe tears were found on the scleral buckle.  Each of these were surrounded with  new laser.  The super wide viewing system was moved into place and the vitrectomy was carried into the extreme periphery out to the ora serrata.  Perfluoron was injected to reattach the superior retina.  Once the superior retina was reattached, a gas was  injected on top of the Perfluoron so that there was  Perfluoron deep and gas anterior.  This allowed the entire retina to be reattached.  The endolaser was positioned in the eye.  A total of 965 burns were placed around the retinal periphery and around  the horseshoe tears.  Primarily these laser marks were placed on the scleral buckle, but some were posterior to the scleral buckle.  The gas fluid exchange was continued and the Perfluoron was removed as sterile gas was placed into the eye.  Some  posterior fluid occurred, but this was allowed to remain.  The retina was then placed on the scleral buckle with no subretinal fluid remaining.  The total laser marks were 965.  The power was 360 milliwatts, 1000 microns each and 0.15 seconds each.  The  scleral flaps were closed with 4-0 Mersilene sutures.  The sutures were knotted and the free ends removed.  C3F8 in a 12% concentration was exchanged for intravitreal gas.  All Perfluoron was removed.  The 25-gauge  trocars were removed one at a time and  then 10-0 nylon was placed on the 2 o'clock sclerae where the trocar was removed.  The wounds were tested and found to be secure.  The conjunctiva was reposited with 7-0 chromic suture.  Polymyxin and ceftazidime were rinsed around the globe for  antibiotic coverage.  Decadron 10 mg was injected into the lower subconjunctival space.  Polysporin ophthalmic ointment, a patch and a shield were  placed.  Atropine solution was applied.  The closing pressure was 10 with a Barraquer tonometer.  COMPLICATIONS:  None.  DURATION:  2 hours.  TN/NUANCE  D:09/18/2018 T:09/18/2018 JOB:[id number]

## 2018-09-18 NOTE — Anesthesia Preprocedure Evaluation (Addendum)
Anesthesia Evaluation  Patient identified by MRN, date of birth, ID band Patient awake    Reviewed: Allergy & Precautions, NPO status , Patient's Chart, lab work & pertinent test results  Airway Mallampati: I  TM Distance: >3 FB Neck ROM: Full    Dental no notable dental hx. (+) Chipped,    Pulmonary neg pulmonary ROS,    Pulmonary exam normal breath sounds clear to auscultation       Cardiovascular hypertension, negative cardio ROS Normal cardiovascular exam Rhythm:Regular Rate:Normal     Neuro/Psych negative neurological ROS  negative psych ROS   GI/Hepatic negative GI ROS, (+) Cirrhosis   Esophageal Varices    , Hepatitis -, C  Endo/Other  negative endocrine ROS  Renal/GU negative Renal ROS  negative genitourinary   Musculoskeletal  (+) Arthritis ,   Abdominal   Peds  Hematology  (+) Blood dyscrasia, anemia ,   Anesthesia Other Findings Recurrent retinal detachment  Reproductive/Obstetrics                            Anesthesia Physical Anesthesia Plan  ASA: III  Anesthesia Plan: General   Post-op Pain Management:    Induction: Intravenous  PONV Risk Score and Plan: 2 and Ondansetron, Dexamethasone and Midazolam  Airway Management Planned: Oral ETT and LMA  Additional Equipment:   Intra-op Plan:   Post-operative Plan: Extubation in OR  Informed Consent: I have reviewed the patients History and Physical, chart, labs and discussed the procedure including the risks, benefits and alternatives for the proposed anesthesia with the patient or authorized representative who has indicated his/her understanding and acceptance.   Dental advisory given  Plan Discussed with: CRNA  Anesthesia Plan Comments:         Anesthesia Quick Evaluation

## 2018-09-18 NOTE — H&P (Signed)
I examined the patient today and there is no change in the medical status 

## 2018-09-18 NOTE — Anesthesia Procedure Notes (Signed)
Procedure Name: Intubation Date/Time: 09/18/2018 2:18 PM Performed by: Genelle Bal, CRNA Pre-anesthesia Checklist: Patient identified, Emergency Drugs available, Suction available and Patient being monitored Patient Re-evaluated:Patient Re-evaluated prior to induction Oxygen Delivery Method: Circle system utilized Preoxygenation: Pre-oxygenation with 100% oxygen Induction Type: IV induction Ventilation: Mask ventilation without difficulty and Oral airway inserted - appropriate to patient size Laryngoscope Size: Sabra Heck and 2 Grade View: Grade II Tube type: Oral Tube size: 7.5 mm Number of attempts: 1 Airway Equipment and Method: Stylet and Oral airway Placement Confirmation: ETT inserted through vocal cords under direct vision,  positive ETCO2 and breath sounds checked- equal and bilateral Secured at: 24 cm Tube secured with: Tape Dental Injury: Teeth and Oropharynx as per pre-operative assessment

## 2018-09-18 NOTE — Brief Op Note (Signed)
Brief Operative note   Preoperative diagnosis:  Recurrent Retinal Detachment Postoperative diagnosis  Complex traction retinal detachment right eye  Procedures: Repair of complex traction retinal detachment with scleral buckle, pars plana vitrectomy, membrane peel, perfluoron injection and removal, laser, C3F8 injection right eye  Surgeon:  Hayden Pedro, MD...  Assistant:  Deatra Ina SA   Anesthesia: General  Specimen: none  Estimated blood loss:  1cc  Complications: none  Patient sent to PACU in good condition  Composed by Hayden Pedro MD  Dictation number: [id number]

## 2018-09-18 NOTE — Transfer of Care (Signed)
Immediate Anesthesia Transfer of Care Note  Patient: Joshua Burton  Procedure(s) Performed: PARS PLANA VITRECTOMY WITH 25 GAUGE WITH PFO, LASER, AND GAS RIGHT EYE (Right Eye)  Patient Location: PACU  Anesthesia Type:General  Level of Consciousness: awake, alert  and oriented  Airway & Oxygen Therapy: Patient Spontanous Breathing and Patient connected to face mask oxygen  Post-op Assessment: Report given to RN and Post -op Vital signs reviewed and stable  Post vital signs: Reviewed and stable  Last Vitals:  Vitals Value Taken Time  BP 181/117 09/18/2018  4:14 PM  Temp    Pulse 102 09/18/2018  4:15 PM  Resp 17 09/18/2018  4:15 PM  SpO2 99 % 09/18/2018  4:15 PM  Vitals shown include unvalidated device data.  Last Pain:  Vitals:   09/18/18 1118  TempSrc: Oral  PainSc: 0-No pain         Complications: No apparent anesthesia complications

## 2018-09-19 ENCOUNTER — Encounter (HOSPITAL_COMMUNITY): Payer: Self-pay | Admitting: Ophthalmology

## 2018-09-19 ENCOUNTER — Encounter (INDEPENDENT_AMBULATORY_CARE_PROVIDER_SITE_OTHER): Payer: Medicare HMO | Admitting: Ophthalmology

## 2018-09-19 DIAGNOSIS — H33011 Retinal detachment with single break, right eye: Secondary | ICD-10-CM | POA: Diagnosis not present

## 2018-09-19 DIAGNOSIS — I8510 Secondary esophageal varices without bleeding: Secondary | ICD-10-CM | POA: Diagnosis not present

## 2018-09-19 DIAGNOSIS — D696 Thrombocytopenia, unspecified: Secondary | ICD-10-CM | POA: Diagnosis not present

## 2018-09-19 DIAGNOSIS — M869 Osteomyelitis, unspecified: Secondary | ICD-10-CM | POA: Diagnosis not present

## 2018-09-19 DIAGNOSIS — K766 Portal hypertension: Secondary | ICD-10-CM | POA: Diagnosis not present

## 2018-09-19 DIAGNOSIS — B1920 Unspecified viral hepatitis C without hepatic coma: Secondary | ICD-10-CM | POA: Diagnosis not present

## 2018-09-19 DIAGNOSIS — H2101 Hyphema, right eye: Secondary | ICD-10-CM

## 2018-09-19 DIAGNOSIS — M1990 Unspecified osteoarthritis, unspecified site: Secondary | ICD-10-CM | POA: Diagnosis not present

## 2018-09-19 DIAGNOSIS — K7030 Alcoholic cirrhosis of liver without ascites: Secondary | ICD-10-CM | POA: Diagnosis not present

## 2018-09-19 DIAGNOSIS — I10 Essential (primary) hypertension: Secondary | ICD-10-CM | POA: Diagnosis not present

## 2018-09-19 MED ORDER — BACITRACIN-POLYMYXIN B 500-10000 UNIT/GM OP OINT: 1.0000 "application " | TOPICAL_OINTMENT | Freq: Three times a day (TID) | OPHTHALMIC | 0 refills | Status: DC

## 2018-09-19 MED ORDER — PREDNISOLONE ACETATE 1 % OP SUSP: 1.0000 [drp] | Freq: Four times a day (QID) | OPHTHALMIC | 0 refills | Status: DC

## 2018-09-19 MED ORDER — HYDROCODONE-ACETAMINOPHEN 5-325 MG PO TABS: 1.0000 | ORAL_TABLET | ORAL | 0 refills | Status: DC | PRN

## 2018-09-19 MED ORDER — LATANOPROST 0.005 % OP SOLN: 1.0000 [drp] | Freq: Every day | OPHTHALMIC | 12 refills | Status: DC

## 2018-09-19 MED ORDER — GATIFLOXACIN 0.5 % OP SOLN: 1.0000 [drp] | Freq: Four times a day (QID) | OPHTHALMIC | Status: DC

## 2018-09-19 MED ORDER — ACETAZOLAMIDE ER 500 MG PO CP12: 500.0000 mg | ORAL_CAPSULE | Freq: Two times a day (BID) | ORAL | 4 refills | Status: DC

## 2018-09-19 MED ORDER — DORZOLAMIDE HCL 2 % OP SOLN: 1.0000 [drp] | Freq: Three times a day (TID) | OPHTHALMIC | 12 refills | Status: DC

## 2018-09-19 MED ORDER — BRIMONIDINE TARTRATE 0.2 % OP SOLN: 1.0000 [drp] | Freq: Two times a day (BID) | OPHTHALMIC | 12 refills | Status: DC

## 2018-09-19 NOTE — Progress Notes (Signed)
09/19/2018, 6:28 AM  Mental Status:  Awake, Alert, Oriented  Anterior segment: Cornea  Clear    Anterior Chamber Hyphema 40%    Lens:    IOL  Intra Ocular Pressure 40 mmHg with Tonopen  Vitreous: Clear 90%gas bubble. Limited view with hyphema  Retina:  Hazy view due to hyphema  Impression: Poor view due to hyphema  Final Diagnosis: Active Problems:   Rhegmatogenous retinal detachment of right eye   Plan: start post operative eye drops.  Add Dorzolomide, Alphagan and Xalatan.  Discharge to home.  Go from Hospital to my office for ocular pressure management.  Give post operative instructions  Hayden Pedro 09/19/2018, 6:28 AM

## 2018-09-19 NOTE — Progress Notes (Signed)
Cleda Mccreedy to be D/C'd  per MD order. Discussed with the patient and all questions fully answered.  VSS, Skin clean, dry and intact without evidence of skin break down, no evidence of skin tears noted.  IV catheter discontinued intact. Site without signs and symptoms of complications. Dressing and pressure applied.  An After Visit Summary was printed and given to the patient. Patient received prescription.  D/c education completed with patient/family including follow up instructions, medication list, d/c activities limitations if indicated, with other d/c instructions as indicated by MD - patient able to verbalize understanding, all questions fully answered.   Patient instructed to return to ED, call 911, or call MD for any changes in condition.   Patient to be escorted via [place], and D/C home via private auto.

## 2018-09-19 NOTE — Discharge Summary (Signed)
Discharge summary not needed on [facility] patients per medical records. 

## 2018-09-19 NOTE — Plan of Care (Signed)

## 2018-09-20 ENCOUNTER — Encounter (INDEPENDENT_AMBULATORY_CARE_PROVIDER_SITE_OTHER): Payer: Medicare HMO | Admitting: Ophthalmology

## 2018-09-20 DIAGNOSIS — H2101 Hyphema, right eye: Secondary | ICD-10-CM

## 2018-09-20 NOTE — Anesthesia Postprocedure Evaluation (Signed)
Anesthesia Post Note  Patient: Joshua Burton  Procedure(s) Performed: Audry Riles OF COMPLEX RETINAL DETACHMENT REVISION OF SCLERAL BUCKLE PARS PLANA VITRECTOMY WITH 25 GAUGE WITH PFO, ENDOLASER MEMBRANE PEEL C3F8 GAS INJECTION CRYO AIR/FLUID EXCHANGE  RIGHT EYE (Right Eye)     Patient location during evaluation: PACU Anesthesia Type: General Level of consciousness: awake and alert Pain management: pain level controlled Vital Signs Assessment: post-procedure vital signs reviewed and stable Respiratory status: spontaneous breathing, nonlabored ventilation, respiratory function stable and patient connected to nasal cannula oxygen Cardiovascular status: blood pressure returned to baseline and stable Postop Assessment: no apparent nausea or vomiting Anesthetic complications: no    Last Vitals:  Vitals:   09/18/18 2037 09/19/18 0513  BP: (!) 159/95 (!) 152/99  Pulse: 91 86  Resp: 16 16  Temp: 36.6 C 36.5 C  SpO2: 98% 98%    Last Pain:  Vitals:   09/19/18 0755  TempSrc:   PainSc: 0-No pain                 Joshua Burton

## 2018-09-21 ENCOUNTER — Encounter (INDEPENDENT_AMBULATORY_CARE_PROVIDER_SITE_OTHER): Payer: Medicare HMO | Admitting: Ophthalmology

## 2018-09-21 DIAGNOSIS — H2101 Hyphema, right eye: Secondary | ICD-10-CM

## 2018-09-24 ENCOUNTER — Encounter (INDEPENDENT_AMBULATORY_CARE_PROVIDER_SITE_OTHER): Payer: Medicare HMO | Admitting: Ophthalmology

## 2018-09-24 DIAGNOSIS — H2101 Hyphema, right eye: Secondary | ICD-10-CM

## 2018-09-25 ENCOUNTER — Encounter (INDEPENDENT_AMBULATORY_CARE_PROVIDER_SITE_OTHER): Payer: Medicare HMO | Admitting: Ophthalmology

## 2018-09-25 DIAGNOSIS — H2101 Hyphema, right eye: Secondary | ICD-10-CM

## 2018-09-26 ENCOUNTER — Encounter (INDEPENDENT_AMBULATORY_CARE_PROVIDER_SITE_OTHER): Payer: Medicare HMO | Admitting: Ophthalmology

## 2018-09-26 DIAGNOSIS — H2101 Hyphema, right eye: Secondary | ICD-10-CM

## 2018-09-27 ENCOUNTER — Encounter (INDEPENDENT_AMBULATORY_CARE_PROVIDER_SITE_OTHER): Payer: Medicare HMO | Admitting: Ophthalmology

## 2018-09-27 DIAGNOSIS — H2101 Hyphema, right eye: Secondary | ICD-10-CM

## 2018-09-28 ENCOUNTER — Encounter (INDEPENDENT_AMBULATORY_CARE_PROVIDER_SITE_OTHER): Payer: Medicare HMO | Admitting: Ophthalmology

## 2018-09-28 DIAGNOSIS — H2101 Hyphema, right eye: Secondary | ICD-10-CM

## 2018-10-01 ENCOUNTER — Encounter (INDEPENDENT_AMBULATORY_CARE_PROVIDER_SITE_OTHER): Payer: Medicare HMO | Admitting: Ophthalmology

## 2018-10-01 ENCOUNTER — Telehealth: Payer: Self-pay

## 2018-10-01 DIAGNOSIS — N529 Male erectile dysfunction, unspecified: Secondary | ICD-10-CM | POA: Insufficient documentation

## 2018-10-01 DIAGNOSIS — H2101 Hyphema, right eye: Secondary | ICD-10-CM

## 2018-10-01 NOTE — Telephone Encounter (Signed)
Next step is ref urol.  Ok to refer?

## 2018-10-01 NOTE — Addendum Note (Signed)
Addended by: Renato Shin on: 10/01/2018 02:18 PM   Modules accepted: Orders

## 2018-10-01 NOTE — Telephone Encounter (Signed)
Pt states he was Rx'd Viagra, has taken it to see if he would have positive effects. Pt called today stating it was ineffective. Requesting an alternative. Advised I would sent message to Dr. Loanne Drilling to determine and alternative medication. Will await his response.

## 2018-10-01 NOTE — Telephone Encounter (Signed)
Spoke with pt. Agreeable to being referred to Urologist. Pt is aware that he will receive a call from the urological office re: his appt

## 2018-10-02 ENCOUNTER — Encounter (INDEPENDENT_AMBULATORY_CARE_PROVIDER_SITE_OTHER): Payer: Medicare HMO | Admitting: Ophthalmology

## 2018-10-02 DIAGNOSIS — H2101 Hyphema, right eye: Secondary | ICD-10-CM

## 2018-10-03 ENCOUNTER — Encounter (INDEPENDENT_AMBULATORY_CARE_PROVIDER_SITE_OTHER): Payer: Medicare HMO | Admitting: Ophthalmology

## 2018-10-03 DIAGNOSIS — H2101 Hyphema, right eye: Secondary | ICD-10-CM

## 2018-10-08 ENCOUNTER — Encounter (INDEPENDENT_AMBULATORY_CARE_PROVIDER_SITE_OTHER): Payer: Medicare HMO | Admitting: Ophthalmology

## 2018-10-08 DIAGNOSIS — H338 Other retinal detachments: Secondary | ICD-10-CM

## 2018-10-09 ENCOUNTER — Telehealth: Payer: Self-pay | Admitting: Endocrinology

## 2018-10-09 DIAGNOSIS — Z8619 Personal history of other infectious and parasitic diseases: Secondary | ICD-10-CM | POA: Diagnosis not present

## 2018-10-09 DIAGNOSIS — Z85828 Personal history of other malignant neoplasm of skin: Secondary | ICD-10-CM | POA: Diagnosis not present

## 2018-10-09 DIAGNOSIS — I10 Essential (primary) hypertension: Secondary | ICD-10-CM | POA: Diagnosis not present

## 2018-10-09 DIAGNOSIS — Z8719 Personal history of other diseases of the digestive system: Secondary | ICD-10-CM | POA: Diagnosis not present

## 2018-10-09 DIAGNOSIS — L570 Actinic keratosis: Secondary | ICD-10-CM | POA: Diagnosis not present

## 2018-10-09 DIAGNOSIS — N529 Male erectile dysfunction, unspecified: Secondary | ICD-10-CM | POA: Diagnosis not present

## 2018-10-09 DIAGNOSIS — K7460 Unspecified cirrhosis of liver: Secondary | ICD-10-CM | POA: Diagnosis not present

## 2018-10-09 DIAGNOSIS — X32XXXD Exposure to sunlight, subsequent encounter: Secondary | ICD-10-CM | POA: Diagnosis not present

## 2018-10-09 DIAGNOSIS — D225 Melanocytic nevi of trunk: Secondary | ICD-10-CM | POA: Diagnosis not present

## 2018-10-09 DIAGNOSIS — Z08 Encounter for follow-up examination after completed treatment for malignant neoplasm: Secondary | ICD-10-CM | POA: Diagnosis not present

## 2018-10-09 DIAGNOSIS — Z8669 Personal history of other diseases of the nervous system and sense organs: Secondary | ICD-10-CM | POA: Diagnosis not present

## 2018-10-09 LAB — HEPATIC FUNCTION PANEL
ALT: 32 (ref 10–40)
AST: 25 (ref 14–40)
Alkaline Phosphatase: 64 (ref 25–125)
Bilirubin, Total: 0.9

## 2018-10-09 LAB — CBC AND DIFFERENTIAL
HCT: 45 (ref 41–53)
HEMOGLOBIN: 15.2 (ref 13.5–17.5)
Platelets: 140 — AB (ref 150–399)
WBC: 5.2

## 2018-10-09 LAB — BASIC METABOLIC PANEL
BUN: 20 (ref 4–21)
Creatinine: 1.1 (ref 0.6–1.3)
GLUCOSE: 104
POTASSIUM: 4.3 (ref 3.4–5.3)
SODIUM: 138 (ref 137–147)

## 2018-10-09 NOTE — Telephone Encounter (Signed)
Returned pt call. Provided with contact information for [facility]. Verbalized acceptance and understanding.

## 2018-10-09 NOTE — Telephone Encounter (Signed)
Patient is requesting a call back to discuss referral sent out to Urology   Please advise

## 2018-10-11 ENCOUNTER — Encounter: Payer: Self-pay | Admitting: Endocrinology

## 2018-10-11 NOTE — Progress Notes (Signed)
[facility] @ [employer]/thx dmf

## 2018-10-15 ENCOUNTER — Encounter (INDEPENDENT_AMBULATORY_CARE_PROVIDER_SITE_OTHER): Payer: Medicare HMO | Admitting: Ophthalmology

## 2018-10-15 DIAGNOSIS — H338 Other retinal detachments: Secondary | ICD-10-CM

## 2018-10-24 ENCOUNTER — Encounter (INDEPENDENT_AMBULATORY_CARE_PROVIDER_SITE_OTHER): Payer: Medicare HMO | Admitting: Ophthalmology

## 2018-10-24 DIAGNOSIS — H338 Other retinal detachments: Secondary | ICD-10-CM

## 2018-11-22 DIAGNOSIS — N5201 Erectile dysfunction due to arterial insufficiency: Secondary | ICD-10-CM | POA: Diagnosis not present

## 2018-11-23 DIAGNOSIS — H33001 Unspecified retinal detachment with retinal break, right eye: Secondary | ICD-10-CM | POA: Diagnosis not present

## 2018-11-24 DIAGNOSIS — H33021 Retinal detachment with multiple breaks, right eye: Secondary | ICD-10-CM | POA: Diagnosis not present

## 2018-11-24 DIAGNOSIS — H33011 Retinal detachment with single break, right eye: Secondary | ICD-10-CM | POA: Diagnosis not present

## 2018-11-24 DIAGNOSIS — Z135 Encounter for screening for eye and ear disorders: Secondary | ICD-10-CM | POA: Diagnosis not present

## 2018-12-11 DIAGNOSIS — Z9889 Other specified postprocedural states: Secondary | ICD-10-CM | POA: Diagnosis not present

## 2018-12-11 DIAGNOSIS — Z961 Presence of intraocular lens: Secondary | ICD-10-CM | POA: Diagnosis not present

## 2018-12-11 DIAGNOSIS — Z48810 Encounter for surgical aftercare following surgery on the sense organs: Secondary | ICD-10-CM | POA: Diagnosis not present

## 2018-12-11 DIAGNOSIS — H33001 Unspecified retinal detachment with retinal break, right eye: Secondary | ICD-10-CM | POA: Diagnosis not present

## 2019-01-02 ENCOUNTER — Encounter (INDEPENDENT_AMBULATORY_CARE_PROVIDER_SITE_OTHER): Payer: Medicare HMO | Admitting: Ophthalmology

## 2019-01-08 DIAGNOSIS — Z48810 Encounter for surgical aftercare following surgery on the sense organs: Secondary | ICD-10-CM | POA: Diagnosis not present

## 2019-01-08 DIAGNOSIS — H33001 Unspecified retinal detachment with retinal break, right eye: Secondary | ICD-10-CM | POA: Diagnosis not present

## 2019-01-08 DIAGNOSIS — H35372 Puckering of macula, left eye: Secondary | ICD-10-CM | POA: Diagnosis not present

## 2019-01-08 DIAGNOSIS — H43822 Vitreomacular adhesion, left eye: Secondary | ICD-10-CM | POA: Diagnosis not present

## 2019-01-08 DIAGNOSIS — Z961 Presence of intraocular lens: Secondary | ICD-10-CM | POA: Diagnosis not present

## 2019-01-14 DIAGNOSIS — H33001 Unspecified retinal detachment with retinal break, right eye: Secondary | ICD-10-CM | POA: Diagnosis not present

## 2019-01-14 DIAGNOSIS — Z961 Presence of intraocular lens: Secondary | ICD-10-CM | POA: Diagnosis not present

## 2019-02-28 ENCOUNTER — Ambulatory Visit (INDEPENDENT_AMBULATORY_CARE_PROVIDER_SITE_OTHER): Payer: Medicare HMO | Admitting: Orthopaedic Surgery

## 2019-02-28 ENCOUNTER — Other Ambulatory Visit: Payer: Self-pay

## 2019-02-28 ENCOUNTER — Encounter (INDEPENDENT_AMBULATORY_CARE_PROVIDER_SITE_OTHER): Payer: Self-pay | Admitting: Orthopaedic Surgery

## 2019-02-28 ENCOUNTER — Ambulatory Visit (INDEPENDENT_AMBULATORY_CARE_PROVIDER_SITE_OTHER): Payer: Medicare HMO

## 2019-02-28 DIAGNOSIS — M25511 Pain in right shoulder: Secondary | ICD-10-CM

## 2019-02-28 DIAGNOSIS — G8929 Other chronic pain: Secondary | ICD-10-CM | POA: Insufficient documentation
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
## 2019-03-18 DIAGNOSIS — Z1159 Encounter for screening for other viral diseases: Secondary | ICD-10-CM | POA: Diagnosis not present

## 2019-03-21 DIAGNOSIS — B182 Chronic viral hepatitis C: Secondary | ICD-10-CM | POA: Diagnosis not present

## 2019-03-21 DIAGNOSIS — Z791 Long term (current) use of non-steroidal anti-inflammatories (NSAID): Secondary | ICD-10-CM | POA: Diagnosis not present

## 2019-03-21 DIAGNOSIS — K7030 Alcoholic cirrhosis of liver without ascites: Secondary | ICD-10-CM | POA: Diagnosis not present

## 2019-03-21 DIAGNOSIS — K766 Portal hypertension: Secondary | ICD-10-CM | POA: Diagnosis not present

## 2019-03-21 DIAGNOSIS — H33001 Unspecified retinal detachment with retinal break, right eye: Secondary | ICD-10-CM | POA: Diagnosis not present

## 2019-03-21 DIAGNOSIS — Z79899 Other long term (current) drug therapy: Secondary | ICD-10-CM | POA: Diagnosis not present

## 2019-03-21 DIAGNOSIS — I10 Essential (primary) hypertension: Secondary | ICD-10-CM | POA: Diagnosis not present

## 2019-03-21 DIAGNOSIS — F1011 Alcohol abuse, in remission: Secondary | ICD-10-CM | POA: Diagnosis not present

## 2019-03-21 DIAGNOSIS — H3321 Serous retinal detachment, right eye: Secondary | ICD-10-CM | POA: Diagnosis not present

## 2019-03-21 DIAGNOSIS — M1990 Unspecified osteoarthritis, unspecified site: Secondary | ICD-10-CM | POA: Diagnosis not present

## 2019-03-22 DIAGNOSIS — Z961 Presence of intraocular lens: Secondary | ICD-10-CM | POA: Diagnosis not present

## 2019-03-22 DIAGNOSIS — H33001 Unspecified retinal detachment with retinal break, right eye: Secondary | ICD-10-CM | POA: Diagnosis not present

## 2019-03-22 DIAGNOSIS — Z9889 Other specified postprocedural states: Secondary | ICD-10-CM | POA: Diagnosis not present

## 2019-03-22 DIAGNOSIS — Z48810 Encounter for surgical aftercare following surgery on the sense organs: Secondary | ICD-10-CM | POA: Diagnosis not present

## 2019-04-11 ENCOUNTER — Ambulatory Visit: Payer: Self-pay | Admitting: Orthopaedic Surgery

## 2019-04-17 ENCOUNTER — Other Ambulatory Visit: Payer: Self-pay

## 2019-04-17 ENCOUNTER — Ambulatory Visit (INDEPENDENT_AMBULATORY_CARE_PROVIDER_SITE_OTHER): Payer: Medicare HMO | Admitting: Orthopaedic Surgery

## 2019-04-17 ENCOUNTER — Encounter: Payer: Self-pay | Admitting: Orthopaedic Surgery

## 2019-04-17 DIAGNOSIS — G8929 Other chronic pain: Secondary | ICD-10-CM | POA: Diagnosis not present

## 2019-04-17 DIAGNOSIS — M25511 Pain in right shoulder: Secondary | ICD-10-CM | POA: Diagnosis not present
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
## 2019-05-03 DIAGNOSIS — H33001 Unspecified retinal detachment with retinal break, right eye: Secondary | ICD-10-CM | POA: Diagnosis not present

## 2019-05-03 DIAGNOSIS — Z9889 Other specified postprocedural states: Secondary | ICD-10-CM | POA: Diagnosis not present

## 2019-05-03 DIAGNOSIS — Z961 Presence of intraocular lens: Secondary | ICD-10-CM | POA: Diagnosis not present

## 2019-05-03 DIAGNOSIS — Z9842 Cataract extraction status, left eye: Secondary | ICD-10-CM | POA: Diagnosis not present

## 2019-05-03 DIAGNOSIS — Z9841 Cataract extraction status, right eye: Secondary | ICD-10-CM | POA: Diagnosis not present

## 2019-05-03 DIAGNOSIS — Z48810 Encounter for surgical aftercare following surgery on the sense organs: Secondary | ICD-10-CM | POA: Diagnosis not present

## 2019-05-03 DIAGNOSIS — H35372 Puckering of macula, left eye: Secondary | ICD-10-CM | POA: Diagnosis not present

## 2019-05-03 DIAGNOSIS — I10 Essential (primary) hypertension: Secondary | ICD-10-CM | POA: Diagnosis not present

## 2019-05-03 DIAGNOSIS — H35033 Hypertensive retinopathy, bilateral: Secondary | ICD-10-CM | POA: Diagnosis not present

## 2019-06-28 DIAGNOSIS — Z9841 Cataract extraction status, right eye: Secondary | ICD-10-CM | POA: Diagnosis not present

## 2019-06-28 DIAGNOSIS — H35372 Puckering of macula, left eye: Secondary | ICD-10-CM | POA: Diagnosis not present

## 2019-06-28 DIAGNOSIS — H33001 Unspecified retinal detachment with retinal break, right eye: Secondary | ICD-10-CM | POA: Diagnosis not present

## 2019-06-28 DIAGNOSIS — Z961 Presence of intraocular lens: Secondary | ICD-10-CM | POA: Diagnosis not present

## 2019-06-28 DIAGNOSIS — Z9842 Cataract extraction status, left eye: Secondary | ICD-10-CM | POA: Diagnosis not present

## 2019-06-28 DIAGNOSIS — H35033 Hypertensive retinopathy, bilateral: Secondary | ICD-10-CM | POA: Diagnosis not present

## 2019-06-28 DIAGNOSIS — Z48810 Encounter for surgical aftercare following surgery on the sense organs: Secondary | ICD-10-CM | POA: Diagnosis not present

## 2019-06-28 DIAGNOSIS — Z9889 Other specified postprocedural states: Secondary | ICD-10-CM | POA: Diagnosis not present

## 2019-06-28 DIAGNOSIS — I10 Essential (primary) hypertension: Secondary | ICD-10-CM | POA: Diagnosis not present

## 2019-09-17 ENCOUNTER — Ambulatory Visit: Payer: Medicare HMO | Admitting: Endocrinology

## 2019-09-30 DIAGNOSIS — Z9841 Cataract extraction status, right eye: Secondary | ICD-10-CM | POA: Diagnosis not present

## 2019-09-30 DIAGNOSIS — H35373 Puckering of macula, bilateral: Secondary | ICD-10-CM | POA: Diagnosis not present

## 2019-09-30 DIAGNOSIS — I10 Essential (primary) hypertension: Secondary | ICD-10-CM | POA: Diagnosis not present

## 2019-09-30 DIAGNOSIS — Z961 Presence of intraocular lens: Secondary | ICD-10-CM | POA: Diagnosis not present

## 2019-09-30 DIAGNOSIS — H33001 Unspecified retinal detachment with retinal break, right eye: Secondary | ICD-10-CM | POA: Diagnosis not present

## 2019-09-30 DIAGNOSIS — H35033 Hypertensive retinopathy, bilateral: Secondary | ICD-10-CM | POA: Diagnosis not present

## 2019-09-30 DIAGNOSIS — Z9842 Cataract extraction status, left eye: Secondary | ICD-10-CM | POA: Diagnosis not present

## 2019-10-07 DIAGNOSIS — I10 Essential (primary) hypertension: Secondary | ICD-10-CM | POA: Diagnosis not present

## 2019-10-07 DIAGNOSIS — M1990 Unspecified osteoarthritis, unspecified site: Secondary | ICD-10-CM | POA: Diagnosis not present

## 2019-10-07 DIAGNOSIS — N400 Enlarged prostate without lower urinary tract symptoms: Secondary | ICD-10-CM | POA: Diagnosis not present

## 2019-11-29 DIAGNOSIS — X32XXXD Exposure to sunlight, subsequent encounter: Secondary | ICD-10-CM | POA: Diagnosis not present

## 2019-11-29 DIAGNOSIS — L570 Actinic keratosis: Secondary | ICD-10-CM | POA: Diagnosis not present

## 2019-11-29 DIAGNOSIS — C4441 Basal cell carcinoma of skin of scalp and neck: Secondary | ICD-10-CM | POA: Diagnosis not present

## 2019-11-29 DIAGNOSIS — L821 Other seborrheic keratosis: Secondary | ICD-10-CM | POA: Diagnosis not present

## 2019-11-29 DIAGNOSIS — D225 Melanocytic nevi of trunk: Secondary | ICD-10-CM | POA: Diagnosis not present

## 2019-11-29 DIAGNOSIS — L820 Inflamed seborrheic keratosis: Secondary | ICD-10-CM | POA: Diagnosis not present

## 2019-12-17 DIAGNOSIS — M1990 Unspecified osteoarthritis, unspecified site: Secondary | ICD-10-CM | POA: Diagnosis not present

## 2019-12-17 DIAGNOSIS — I10 Essential (primary) hypertension: Secondary | ICD-10-CM | POA: Diagnosis not present

## 2019-12-17 DIAGNOSIS — N400 Enlarged prostate without lower urinary tract symptoms: Secondary | ICD-10-CM | POA: Diagnosis not present

## 2019-12-27 DIAGNOSIS — L570 Actinic keratosis: Secondary | ICD-10-CM | POA: Diagnosis not present

## 2019-12-27 DIAGNOSIS — X32XXXD Exposure to sunlight, subsequent encounter: Secondary | ICD-10-CM | POA: Diagnosis not present

## 2020-02-19 DIAGNOSIS — H43392 Other vitreous opacities, left eye: Secondary | ICD-10-CM | POA: Diagnosis not present

## 2020-03-02 ENCOUNTER — Encounter (INDEPENDENT_AMBULATORY_CARE_PROVIDER_SITE_OTHER): Payer: Self-pay | Admitting: Ophthalmology

## 2020-03-02 ENCOUNTER — Ambulatory Visit (INDEPENDENT_AMBULATORY_CARE_PROVIDER_SITE_OTHER): Payer: Medicare HMO | Admitting: Ophthalmology

## 2020-03-02 ENCOUNTER — Other Ambulatory Visit (HOSPITAL_COMMUNITY)
Admission: RE | Admit: 2020-03-02 | Discharge: 2020-03-02 | Disposition: A | Discharge status: Home / Self Care | Payer: Medicare HMO | Source: Ambulatory Visit | Attending: Ophthalmology | Admitting: Ophthalmology

## 2020-03-02 DIAGNOSIS — Z20822 Contact with and (suspected) exposure to covid-19: Secondary | ICD-10-CM | POA: Diagnosis not present

## 2020-03-02 DIAGNOSIS — Z961 Presence of intraocular lens: Secondary | ICD-10-CM

## 2020-03-02 DIAGNOSIS — H35373 Puckering of macula, bilateral: Secondary | ICD-10-CM

## 2020-03-02 DIAGNOSIS — H3581 Retinal edema: Secondary | ICD-10-CM

## 2020-03-02 DIAGNOSIS — H3322 Serous retinal detachment, left eye: Secondary | ICD-10-CM | POA: Diagnosis not present

## 2020-03-02 DIAGNOSIS — I10 Essential (primary) hypertension: Secondary | ICD-10-CM | POA: Diagnosis not present

## 2020-03-02 DIAGNOSIS — Z01812 Encounter for preprocedural laboratory examination: Secondary | ICD-10-CM | POA: Diagnosis not present

## 2020-03-02 DIAGNOSIS — Z8669 Personal history of other diseases of the nervous system and sense organs: Secondary | ICD-10-CM | POA: Diagnosis not present

## 2020-03-02 DIAGNOSIS — H35033 Hypertensive retinopathy, bilateral: Secondary | ICD-10-CM

## 2020-03-02 LAB — SARS CORONAVIRUS 2 (TAT 6-24 HRS): SARS Coronavirus 2: NEGATIVE
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
## 2020-03-03 ENCOUNTER — Other Ambulatory Visit: Payer: Self-pay

## 2020-03-03 ENCOUNTER — Encounter (HOSPITAL_COMMUNITY): Payer: Self-pay | Admitting: Ophthalmology

## 2020-03-03 NOTE — Progress Notes (Signed)
Spoke with pt for pre-op call. Pt is treated for HTN but denies any cardiac history.   Covid test done 03/02/20 and it is negative. Pt states he's been in quarantine since the test was done and understands that he stays in quarantine until he comes to the hospital on Thursday.
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
## 2020-03-05 ENCOUNTER — Ambulatory Visit (HOSPITAL_COMMUNITY): Payer: Medicare HMO | Admitting: Anesthesiology

## 2020-03-05 ENCOUNTER — Ambulatory Visit (HOSPITAL_COMMUNITY)
Admission: RE | Admit: 2020-03-05 | Discharge: 2020-03-05 | Disposition: A | Discharge status: Home / Self Care | Payer: Medicare HMO | Source: Ambulatory Visit | Attending: Ophthalmology | Admitting: Ophthalmology

## 2020-03-05 ENCOUNTER — Encounter (HOSPITAL_COMMUNITY): Payer: Self-pay | Admitting: Ophthalmology

## 2020-03-05 ENCOUNTER — Encounter (HOSPITAL_COMMUNITY)
Admission: RE | Disposition: A | Discharge status: Home / Self Care | Payer: Self-pay | Source: Ambulatory Visit | Attending: Ophthalmology

## 2020-03-05 ENCOUNTER — Other Ambulatory Visit: Payer: Self-pay

## 2020-03-05 DIAGNOSIS — H33012 Retinal detachment with single break, left eye: Secondary | ICD-10-CM | POA: Diagnosis not present

## 2020-03-05 DIAGNOSIS — M1990 Unspecified osteoarthritis, unspecified site: Secondary | ICD-10-CM | POA: Insufficient documentation

## 2020-03-05 DIAGNOSIS — H3322 Serous retinal detachment, left eye: Secondary | ICD-10-CM | POA: Diagnosis not present

## 2020-03-05 DIAGNOSIS — H33002 Unspecified retinal detachment with retinal break, left eye: Secondary | ICD-10-CM | POA: Insufficient documentation

## 2020-03-05 DIAGNOSIS — H338 Other retinal detachments: Secondary | ICD-10-CM | POA: Diagnosis not present

## 2020-03-05 DIAGNOSIS — I10 Essential (primary) hypertension: Secondary | ICD-10-CM | POA: Insufficient documentation

## 2020-03-05 HISTORY — PX: VITRECTOMY 25 GAUGE WITH SCLERAL BUCKLE: SHX6183

## 2020-03-05 LAB — CBC
HCT: 46.6 % (ref 39.0–52.0)
Hemoglobin: 15.6 g/dL (ref 13.0–17.0)
MCH: 31.9 pg (ref 26.0–34.0)
MCHC: 33.5 g/dL (ref 30.0–36.0)
MCV: 95.3 fL (ref 80.0–100.0)
Platelets: 155 10*3/uL (ref 150–400)
RBC: 4.89 MIL/uL (ref 4.22–5.81)
RDW: 12.9 % (ref 11.5–15.5)
WBC: 5.2 10*3/uL (ref 4.0–10.5)
nRBC: 0 % (ref 0.0–0.2)

## 2020-03-05 LAB — COMPREHENSIVE METABOLIC PANEL
ALT: 37 U/L (ref 0–44)
AST: 29 U/L (ref 15–41)
Albumin: 4.2 g/dL (ref 3.5–5.0)
Alkaline Phosphatase: 77 U/L (ref 38–126)
Anion gap: 10 (ref 5–15)
BUN: 13 mg/dL (ref 8–23)
CO2: 22 mmol/L (ref 22–32)
Calcium: 9 mg/dL (ref 8.9–10.3)
Chloride: 105 mmol/L (ref 98–111)
Creatinine, Ser: 0.86 mg/dL (ref 0.61–1.24)
GFR calc Af Amer: >60 mL/min (ref >60)
GFR calc non Af Amer: >60 mL/min (ref >60)
Glucose, Bld: 96 mg/dL (ref 70–99)
Potassium: 3.9 mmol/L (ref 3.5–5.1)
Sodium: 137 mmol/L (ref 135–145)
Total Bilirubin: 1 mg/dL (ref 0.3–1.2)
Total Protein: 7.2 g/dL (ref 6.5–8.1)

## 2020-03-05 SURGERY — VITRECTOMY, USING 25-GAUGE INSTRUMENTS, WITH SCLERAL BUCKLING
Anesthesia: General | Site: Eye | Laterality: Left

## 2020-03-05 MED ORDER — PROPOFOL 10 MG/ML IV BOLUS
INTRAVENOUS | Status: AC
  Filled 2020-03-05: qty 20

## 2020-03-05 MED ORDER — ROCURONIUM BROMIDE 10 MG/ML (PF) SYRINGE
PREFILLED_SYRINGE | INTRAVENOUS | Status: DC | PRN
  Administered 2020-03-05: 60 mg via INTRAVENOUS
  Administered 2020-03-05: 10 mg via INTRAVENOUS

## 2020-03-05 MED ORDER — CEFTAZIDIME 1 G IJ SOLR
INTRAMUSCULAR | Status: AC
  Filled 2020-03-05: qty 1

## 2020-03-05 MED ORDER — BACITRACIN-POLYMYXIN B 500-10000 UNIT/GM OP OINT
TOPICAL_OINTMENT | OPHTHALMIC | Status: DC | PRN
  Administered 2020-03-05: 1 via OPHTHALMIC

## 2020-03-05 MED ORDER — PREDNISOLONE ACETATE 1 % OP SUSP
OPHTHALMIC | Status: DC | PRN
  Administered 2020-03-05: 1 [drp] via OPHTHALMIC

## 2020-03-05 MED ORDER — EPINEPHRINE PF 1 MG/ML IJ SOLN
INTRAMUSCULAR | Status: DC | PRN
  Administered 2020-03-05: .3 mL

## 2020-03-05 MED ORDER — ONDANSETRON HCL 4 MG/2ML IJ SOLN
INTRAMUSCULAR | Status: DC | PRN
  Administered 2020-03-05: 4 mg via INTRAVENOUS

## 2020-03-05 MED ORDER — MIDAZOLAM HCL 2 MG/2ML IJ SOLN
INTRAMUSCULAR | Status: AC
  Filled 2020-03-05: qty 2

## 2020-03-05 MED ORDER — GATIFLOXACIN 0.5 % OP SOLN
OPHTHALMIC | Status: DC | PRN
  Administered 2020-03-05: 1 [drp] via OPHTHALMIC

## 2020-03-05 MED ORDER — DORZOLAMIDE HCL-TIMOLOL MAL 2-0.5 % OP SOLN
OPHTHALMIC | Status: DC | PRN
  Administered 2020-03-05: 1 [drp] via OPHTHALMIC

## 2020-03-05 MED ORDER — PHENYLEPHRINE HCL 10 % OP SOLN
1.0000 [drp] | OPHTHALMIC | Status: AC | PRN
  Administered 2020-03-05 (×3): 1 [drp] via OPHTHALMIC
  Filled 2020-03-05: qty 5

## 2020-03-05 MED ORDER — BACITRACIN-POLYMYXIN B 500-10000 UNIT/GM OP OINT
TOPICAL_OINTMENT | OPHTHALMIC | Status: AC
  Filled 2020-03-05: qty 3.5

## 2020-03-05 MED ORDER — BUPIVACAINE HCL (PF) 0.75 % IJ SOLN
INTRAMUSCULAR | Status: DC | PRN
  Administered 2020-03-05: 5 mL

## 2020-03-05 MED ORDER — ALBUMIN HUMAN 5 % IV SOLN
INTRAVENOUS | Status: DC | PRN
Start: 2020-03-05 — End: 2020-03-05

## 2020-03-05 MED ORDER — NA CHONDROIT SULF-NA HYALURON 40-30 MG/ML IO SOLN
INTRAOCULAR | Status: AC
  Filled 2020-03-05: qty 0.5

## 2020-03-05 MED ORDER — DEXAMETHASONE SODIUM PHOSPHATE 10 MG/ML IJ SOLN
INTRAMUSCULAR | Status: AC
  Filled 2020-03-05: qty 1

## 2020-03-05 MED ORDER — SODIUM CHLORIDE 0.9 % IV SOLN: INTRAVENOUS | Status: DC

## 2020-03-05 MED ORDER — ONDANSETRON HCL 4 MG/2ML IJ SOLN
INTRAMUSCULAR | Status: AC
  Filled 2020-03-05: qty 2

## 2020-03-05 MED ORDER — STERILE WATER FOR INJECTION IJ SOLN
INTRAMUSCULAR | Status: DC | PRN
  Administered 2020-03-05: 20 mL

## 2020-03-05 MED ORDER — ATROPINE SULFATE 1 % OP SOLN
1.0000 [drp] | OPHTHALMIC | Status: AC | PRN
  Administered 2020-03-05 (×3): 1 [drp] via OPHTHALMIC
  Filled 2020-03-05: qty 5

## 2020-03-05 MED ORDER — EPINEPHRINE PF 1 MG/ML IJ SOLN
INTRAMUSCULAR | Status: AC
  Filled 2020-03-05: qty 1

## 2020-03-05 MED ORDER — HYDROCODONE-ACETAMINOPHEN 5-325 MG PO TABS: 1.0000 | ORAL_TABLET | ORAL | 0 refills | Status: AC | PRN

## 2020-03-05 MED ORDER — PREDNISOLONE ACETATE 1 % OP SUSP
OPHTHALMIC | Status: AC
  Filled 2020-03-05: qty 5

## 2020-03-05 MED ORDER — POLYMYXIN B SULFATE 500000 UNITS IJ SOLR
INTRAMUSCULAR | Status: AC
  Filled 2020-03-05: qty 500000

## 2020-03-05 MED ORDER — BRIMONIDINE TARTRATE 0.2 % OP SOLN
OPHTHALMIC | Status: DC | PRN
  Administered 2020-03-05: 1 [drp] via OPHTHALMIC

## 2020-03-05 MED ORDER — NA CHONDROIT SULF-NA HYALURON 40-30 MG/ML IO SOLN
INTRAOCULAR | Status: DC | PRN
  Administered 2020-03-05: 0.5 mL via INTRAOCULAR

## 2020-03-05 MED ORDER — TRIAMCINOLONE ACETONIDE 40 MG/ML IJ SUSP
INTRAMUSCULAR | Status: DC | PRN
  Administered 2020-03-05: 40 mg

## 2020-03-05 MED ORDER — PHENYLEPHRINE HCL (PRESSORS) 10 MG/ML IV SOLN
INTRAVENOUS | Status: DC | PRN
Start: 2020-03-05 — End: 2020-03-05
  Administered 2020-03-05: 80 ug via INTRAVENOUS
  Administered 2020-03-05: 120 ug via INTRAVENOUS
  Administered 2020-03-05: 100 ug via INTRAVENOUS
  Administered 2020-03-05: 160 ug via INTRAVENOUS
  Administered 2020-03-05 (×2): 100 ug via INTRAVENOUS
  Administered 2020-03-05: 120 ug via INTRAVENOUS

## 2020-03-05 MED ORDER — DEXAMETHASONE SODIUM PHOSPHATE 10 MG/ML IJ SOLN
INTRAMUSCULAR | Status: DC | PRN
Start: 2020-03-05 — End: 2020-03-05
  Administered 2020-03-05: 10 mg via INTRAVENOUS

## 2020-03-05 MED ORDER — BSS PLUS IO SOLN
INTRAOCULAR | Status: DC | PRN
  Administered 2020-03-05: 1 via INTRAOCULAR

## 2020-03-05 MED ORDER — BUPIVACAINE HCL (PF) 0.75 % IJ SOLN
INTRAMUSCULAR | Status: AC
  Filled 2020-03-05: qty 10

## 2020-03-05 MED ORDER — STERILE WATER FOR INJECTION IJ SOLN
INTRAMUSCULAR | Status: AC
  Filled 2020-03-05: qty 10

## 2020-03-05 MED ORDER — CLONIDINE HCL 0.2 MG PO TABS
0.2000 mg | ORAL_TABLET | ORAL | Status: AC
  Administered 2020-03-05: 0.2 mg via ORAL

## 2020-03-05 MED ORDER — SODIUM HYALURONATE 10 MG/ML IO SOLN
INTRAOCULAR | Status: AC
  Filled 2020-03-05: qty 0.85

## 2020-03-05 MED ORDER — FENTANYL CITRATE (PF) 100 MCG/2ML IJ SOLN
INTRAMUSCULAR | Status: DC | PRN
  Administered 2020-03-05: 100 ug via INTRAVENOUS
  Administered 2020-03-05: 50 ug via INTRAVENOUS

## 2020-03-05 MED ORDER — PROPOFOL 10 MG/ML IV BOLUS
INTRAVENOUS | Status: DC | PRN
  Administered 2020-03-05: 200 mg via INTRAVENOUS

## 2020-03-05 MED ORDER — LIDOCAINE HCL 1 % IJ SOLN
INTRAMUSCULAR | Status: DC | PRN
  Administered 2020-03-05: 5 mL

## 2020-03-05 MED ORDER — BSS PLUS IO SOLN
INTRAOCULAR | Status: AC
  Filled 2020-03-05: qty 500

## 2020-03-05 MED ORDER — PHENYLEPHRINE 40 MCG/ML (10ML) SYRINGE FOR IV PUSH (FOR BLOOD PRESSURE SUPPORT)
PREFILLED_SYRINGE | INTRAVENOUS | Status: AC
  Filled 2020-03-05: qty 20

## 2020-03-05 MED ORDER — TRIAMCINOLONE ACETONIDE 40 MG/ML IJ SUSP
INTRAMUSCULAR | Status: AC
  Filled 2020-03-05: qty 5

## 2020-03-05 MED ORDER — GATIFLOXACIN 0.5 % OP SOLN
OPHTHALMIC | Status: AC
  Filled 2020-03-05: qty 2.5

## 2020-03-05 MED ORDER — SODIUM CHLORIDE (PF) 0.9 % IJ SOLN
INTRAMUSCULAR | Status: AC
  Filled 2020-03-05: qty 10

## 2020-03-05 MED ORDER — ATROPINE SULFATE 1 % OP SOLN
OPHTHALMIC | Status: DC | PRN
  Administered 2020-03-05: 1 [drp] via OPHTHALMIC

## 2020-03-05 MED ORDER — BRIMONIDINE TARTRATE 0.2 % OP SOLN
OPHTHALMIC | Status: AC
  Filled 2020-03-05: qty 5

## 2020-03-05 MED ORDER — ATROPINE SULFATE 1 % OP SOLN
OPHTHALMIC | Status: AC
  Filled 2020-03-05: qty 5

## 2020-03-05 MED ORDER — DORZOLAMIDE HCL-TIMOLOL MAL 2-0.5 % OP SOLN
OPHTHALMIC | Status: AC
  Filled 2020-03-05: qty 10

## 2020-03-05 MED ORDER — LIDOCAINE HCL (PF) 1 % IJ SOLN
INTRAMUSCULAR | Status: AC
  Filled 2020-03-05: qty 5

## 2020-03-05 MED ORDER — SUGAMMADEX SODIUM 200 MG/2ML IV SOLN
INTRAVENOUS | Status: DC | PRN
Start: 2020-03-05 — End: 2020-03-05
  Administered 2020-03-05: 300 mg via INTRAVENOUS

## 2020-03-05 MED ORDER — SODIUM CHLORIDE 0.9 % IV SOLN
INTRAVENOUS | Status: DC | PRN
Start: 2020-03-05 — End: 2020-03-05

## 2020-03-05 MED ORDER — ACETAZOLAMIDE SODIUM 500 MG IJ SOLR
INTRAMUSCULAR | Status: AC
  Filled 2020-03-05: qty 500

## 2020-03-05 MED ORDER — TROPICAMIDE 1 % OP SOLN
1.0000 [drp] | OPHTHALMIC | Status: AC | PRN
  Administered 2020-03-05 (×3): 1 [drp] via OPHTHALMIC
  Filled 2020-03-05: qty 15

## 2020-03-05 MED ORDER — EPINEPHRINE 1 MG/10ML IJ SOSY
PREFILLED_SYRINGE | INTRAMUSCULAR | Status: AC
  Filled 2020-03-05: qty 10

## 2020-03-05 MED ORDER — MIDAZOLAM HCL 5 MG/5ML IJ SOLN
INTRAMUSCULAR | Status: DC | PRN
  Administered 2020-03-05: 2 mg via INTRAVENOUS

## 2020-03-05 MED ORDER — PROPARACAINE HCL 0.5 % OP SOLN
1.0000 [drp] | OPHTHALMIC | Status: AC | PRN
  Administered 2020-03-05 (×3): 1 [drp] via OPHTHALMIC
  Filled 2020-03-05: qty 15

## 2020-03-05 MED ORDER — BSS IO SOLN
INTRAOCULAR | Status: AC
  Filled 2020-03-05: qty 15

## 2020-03-05 MED ORDER — FENTANYL CITRATE (PF) 250 MCG/5ML IJ SOLN
INTRAMUSCULAR | Status: AC
  Filled 2020-03-05: qty 5

## 2020-03-05 MED ORDER — BSS IO SOLN
INTRAOCULAR | Status: DC | PRN
  Administered 2020-03-05: 15 mL via INTRAOCULAR

## 2020-03-05 MED ORDER — CYCLOPENTOLATE HCL 1 % OP SOLN
1.0000 [drp] | OPHTHALMIC | Status: AC | PRN
  Administered 2020-03-05 (×3): 1 [drp] via OPHTHALMIC
  Filled 2020-03-05: qty 2

## 2020-03-05 MED ORDER — ROCURONIUM BROMIDE 10 MG/ML (PF) SYRINGE
PREFILLED_SYRINGE | INTRAVENOUS | Status: AC
  Filled 2020-03-05: qty 10

## 2020-03-05 MED ORDER — CLONIDINE HCL 0.2 MG PO TABS
ORAL_TABLET | ORAL | Status: AC
  Filled 2020-03-05: qty 1

## 2020-03-05 SURGICAL SUPPLY — 74 items
APL SWBSTK 6 STRL LF DISP (MISCELLANEOUS) ×4
APPLICATOR COTTON TIP 6 STRL (MISCELLANEOUS) ×4 IMPLANT
APPLICATOR COTTON TIP 6IN STRL (MISCELLANEOUS) ×8
BAND SCLERAL BUCKLING TYPE 41 (Ophthalmic Related) ×1 IMPLANT
BAND WRIST GAS GREEN (MISCELLANEOUS) IMPLANT
BETADINE 5% OPHTHALMIC (OPHTHALMIC) ×2 IMPLANT
BLADE EYE CATARACT 19 1.4 BEAV (BLADE) IMPLANT
BLADE MVR KNIFE 19G (BLADE) IMPLANT
BNDG EYE OVAL (GAUZE/BANDAGES/DRESSINGS) ×1 IMPLANT
CABLE BIPOLOR RESECTION CORD (MISCELLANEOUS) IMPLANT
CANNULA DUAL BORE 23G (CANNULA) IMPLANT
CANNULA FLEX TIP 25G (CANNULA) ×2 IMPLANT
CANNULA VLV SOFT TIP 25G (OPHTHALMIC) IMPLANT
CANNULA VLV SOFT TIP 25GA (OPHTHALMIC) IMPLANT
COVER SURGICAL LIGHT HANDLE (MISCELLANEOUS) ×2 IMPLANT
COVER WAND RF STERILE (DRAPES) ×1 IMPLANT
DRAPE MICROSCOPE LEICA 46X105 (MISCELLANEOUS) ×2 IMPLANT
ERASER HMR WETFIELD 23G BP (MISCELLANEOUS) IMPLANT
FILTER BLUE MILLIPORE (MISCELLANEOUS) ×1 IMPLANT
FILTER STRAW FLUID ASPIR (MISCELLANEOUS) IMPLANT
FORCEPS GRIESHABER ILM 25G A (INSTRUMENTS) IMPLANT
GAS AUTO FILL CONSTEL (OPHTHALMIC)
GAS AUTO FILL CONSTELLATION (OPHTHALMIC) IMPLANT
GAS WRIST BAND GREEN (MISCELLANEOUS)
GLOVE BIO SURGEON STRL SZ7.5 (GLOVE) ×4 IMPLANT
GLOVE BIOGEL M 7.0 STRL (GLOVE) ×2 IMPLANT
GOWN STRL REUS W/ TWL LRG LVL3 (GOWN DISPOSABLE) ×2 IMPLANT
GOWN STRL REUS W/ TWL XL LVL3 (GOWN DISPOSABLE) ×1 IMPLANT
GOWN STRL REUS W/TWL LRG LVL3 (GOWN DISPOSABLE) ×4
GOWN STRL REUS W/TWL XL LVL3 (GOWN DISPOSABLE) ×2
KIT BASIN OR (CUSTOM PROCEDURE TRAY) ×2 IMPLANT
KIT PERFLUORON PROCEDURE 5ML (MISCELLANEOUS) IMPLANT
KNIFE CRESCENT 1.75 EDGEAHEAD (BLADE) IMPLANT
KNIFE GRIESHABER SHARP 2.5MM (MISCELLANEOUS) ×2 IMPLANT
LENS BIOM SUPER VIEW SET DISP (MISCELLANEOUS) IMPLANT
LENS VITRECTOMY FLAT OCLR DISP (MISCELLANEOUS) IMPLANT
MICROPICK 25G (MISCELLANEOUS)
NDL 18GX1X1/2 (RX/OR ONLY) (NEEDLE) ×1 IMPLANT
NDL 25GX 5/8IN NON SAFETY (NEEDLE) ×4 IMPLANT
NDL HYPO 30X.5 LL (NEEDLE) ×2 IMPLANT
NEEDLE 18GX1X1/2 (RX/OR ONLY) (NEEDLE) ×2 IMPLANT
NEEDLE 25GX 5/8IN NON SAFETY (NEEDLE) ×8 IMPLANT
NEEDLE HYPO 30X.5 LL (NEEDLE) ×4 IMPLANT
NS IRRIG 1000ML POUR BTL (IV SOLUTION) ×2 IMPLANT
OPHTHALMIC BETADINE 5% (OPHTHALMIC) ×4
PACK VITRECTOMY CUSTOM (CUSTOM PROCEDURE TRAY) ×2 IMPLANT
PAD ARMBOARD 7.5X6 YLW CONV (MISCELLANEOUS) ×4 IMPLANT
PAK PIK VITRECTOMY CVS 25GA (OPHTHALMIC) ×2 IMPLANT
PIC ILLUMINATED 25G (OPHTHALMIC)
PICK MICROPICK 25G (MISCELLANEOUS) IMPLANT
PIK ILLUMINATED 25G (OPHTHALMIC) IMPLANT
PROBE DIATHERMY DSP 27GA (MISCELLANEOUS) IMPLANT
PROBE ENDO DIATHERMY 25G (MISCELLANEOUS) IMPLANT
PROBE LASER ILLUM FLEX CVD 25G (OPHTHALMIC) ×2 IMPLANT
REPL STRA BRUSH NDL (NEEDLE) IMPLANT
REPL STRA BRUSH NEEDLE (NEEDLE) IMPLANT
RESERVOIR BACK FLUSH (MISCELLANEOUS) IMPLANT
SCRAPER DIAMOND 25GA (OPHTHALMIC RELATED) IMPLANT
SUT ETHILON 5.0 S-24 (SUTURE) ×2 IMPLANT
SUT ETHILON 9 0 TG140 8 (SUTURE) IMPLANT
SUT SILK 2 0 (SUTURE) ×2
SUT SILK 2 0 TIES 17X18 (SUTURE) ×2
SUT SILK 2-0 18XBRD TIE 12 (SUTURE) ×1 IMPLANT
SUT SILK 2-0 18XBRD TIE BLK (SUTURE) ×1 IMPLANT
SUT VICRYL 7 0 TG140 8 (SUTURE) ×2 IMPLANT
SYR 10ML LL (SYRINGE) ×2 IMPLANT
SYR 20ML LL LF (SYRINGE) ×2 IMPLANT
SYR 5ML LL (SYRINGE) ×2 IMPLANT
SYR BULB 3OZ (MISCELLANEOUS) ×2 IMPLANT
SYR TB 1ML LUER SLIP (SYRINGE) ×2 IMPLANT
TOWEL GREEN STERILE FF (TOWEL DISPOSABLE) ×2 IMPLANT
TRAY FOLEY W/BAG SLVR 14FR (SET/KITS/TRAYS/PACK) ×2 IMPLANT
TUBING HIGH PRESS EXTEN 6IN (TUBING) IMPLANT
WATER STERILE IRR 1000ML POUR (IV SOLUTION) ×2 IMPLANT

## 2020-03-05 NOTE — Progress Notes (Signed)
Hypertensive: Patient BP elevated. Per patient, he did not take his scheduled Clonidine today PTA, last dose was taken yesterday morning at 0600. Dr. Glennon Mac made aware. Verbal order received for Clonidine 0.2 mg PO.    03/05/20 1250 03/05/20 1341  Vitals  BP (!) 182/106 (!) 196/116

## 2020-03-05 NOTE — Anesthesia Postprocedure Evaluation (Signed)
Anesthesia Post Note  Patient: Joshua Burton  Procedure(s) Performed: VITRECTOMY 25 GAUGE WITH SCLERAL BUCKLE (Left Eye)     Patient location during evaluation: PACU Anesthesia Type: General Level of consciousness: awake and alert Pain management: pain level controlled Vital Signs Assessment: post-procedure vital signs reviewed and stable Respiratory status: spontaneous breathing, nonlabored ventilation, respiratory function stable and patient connected to nasal cannula oxygen Cardiovascular status: blood pressure returned to baseline and stable Postop Assessment: no apparent nausea or vomiting Anesthetic complications: no    Last Vitals:  Vitals:   03/05/20 1939 03/05/20 1953  BP: (!) 151/97 (!) 148/94  Pulse: (!) 102 98  Resp: 14 18  Temp: 37.2 C 37.1 C  SpO2: 95% 98%    Last Pain:  Vitals:   03/05/20 2000  TempSrc:   PainSc: 0-No pain                 Tiajuana Amass

## 2020-03-05 NOTE — Transfer of Care (Signed)
Immediate Anesthesia Transfer of Care Note  Patient: Joshua Burton  Procedure(s) Performed: VITRECTOMY 25 GAUGE WITH SCLERAL BUCKLE (Left Eye)  Patient Location: PACU  Anesthesia Type:General  Level of Consciousness: awake, alert  and oriented  Airway & Oxygen Therapy: Patient Spontanous Breathing  Post-op Assessment: Report given to RN and Post -op Vital signs reviewed and stable  Post vital signs: Reviewed and stable  Last Vitals:  Vitals Value Taken Time  BP 151/97 03/05/20 1939  Temp 37.2 C 03/05/20 1939  Pulse 101 03/05/20 1946  Resp 27 03/05/20 1946  SpO2 97 % 03/05/20 1946  Vitals shown include unvalidated device data.  Last Pain:  Vitals:   03/05/20 1939  TempSrc:   PainSc: 0-No pain      Patients Stated Pain Goal: 4 ([id number])  Complications: No apparent anesthesia complications

## 2020-03-05 NOTE — Anesthesia Preprocedure Evaluation (Addendum)
Anesthesia Evaluation  Patient identified by MRN, date of birth, ID band Patient awake    Reviewed: Allergy & Precautions, NPO status , Patient's Chart, lab work & pertinent test results  History of Anesthesia Complications Negative for: history of anesthetic complications  Airway Mallampati: II  TM Distance: >3 FB Neck ROM: Full    Dental  (+) Chipped, Poor Dentition, Dental Advisory Given   Pulmonary neg pulmonary ROS,  03/02/2020 SARS coronavirus NEG   breath sounds clear to auscultation       Cardiovascular hypertension, Pt. on medications (-) angina Rhythm:Regular Rate:Normal     Neuro/Psych negative neurological ROS     GI/Hepatic negative GI ROS, (+) Cirrhosis   Esophageal Varices  substance abuse  alcohol use, Hepatitis -, C  Endo/Other  negative endocrine ROS  Renal/GU negative Renal ROS     Musculoskeletal   Abdominal   Peds  Hematology negative hematology ROS (+)   Anesthesia Other Findings   Reproductive/Obstetrics                            Anesthesia Physical Anesthesia Plan  ASA: III  Anesthesia Plan: General   Post-op Pain Management:    Induction: Intravenous  PONV Risk Score and Plan: 2 and Ondansetron and Dexamethasone  Airway Management Planned: Oral ETT  Additional Equipment:   Intra-op Plan:   Post-operative Plan: Extubation in OR  Informed Consent: I have reviewed the patients History and Physical, chart, labs and discussed the procedure including the risks, benefits and alternatives for the proposed anesthesia with the patient or authorized representative who has indicated his/her understanding and acceptance.     Dental advisory given  Plan Discussed with: CRNA and Surgeon  Anesthesia Plan Comments:        Anesthesia Quick Evaluation

## 2020-03-05 NOTE — Op Note (Signed)
Date of procedure: 04.29.21  Surgeon: [name], MD, PhD  Assistant:Amanda Owens Shark, Ophthalmic Assistant   Pre-operative Diagnosis: MaculainvolvingRhegmatogenous Retinal Detachment, Left Eye  Post-operative diagnosis: MaculainvolvingRhegmatogenous Retinal Detachment, LeftEye  Anesthesia: GETA  Procedures: 1) Scleral Buckle, Left Eye 2) 25gauge pars plana vitrectomy,Left Eye CPT [phone] 3) Perfluorocarbon injection 4)Fluid-air exchange, LeftEye 5) Endolaser, LeftEye 5) Injection 0000000  Complications: none Estimated blood loss: minimal Specimens: none  Brief history: The patient has a history of decreased vision in the affected left eye, and on examination, was noted to have a macula-involving retinal detachment, affecting activities of daily living.The risks, benefits, and alternatives were explained to the patient, including pain, bleeding, infection, loss of vision, double vision, droopy eyelids, and need for more surgeries.Informed consent was obtained from the patient and placed in the chart.   Procedure: The patient was brought to the preoperative holding area where the correct eye wasconfirmed and marked.The patient was then brought to the operating room where general endotracheal anesthesia was induced. A secondary time-out was performed to identify the correct patient, eyes, procedures, and any allergies.The left eye was prepped and draped in the usual sterile ophthalmic fashion followed by placement of a lid speculum. A 360 conjunctival peritomy was created using Westcott scissors and 0.12 forceps. Each of the four quadrants between the rectus muscles was dissected using Stevens scissors to detach Tenon's attachments from the globe. Each of the four rectus muscles was isolated on a muscle hook and slung using 2-0 Silk suture in the usual standard fashion. Each of the four quadrants between  the rectus muscles was inspected and there were noted to be no areas of scleral thinning. A [id number] silicone band was then brought onto the field and was threaded under each rectus muscle. The band was thenlooselysecured using a #70 Watzke sleeve in the inferonasalquadrant. The band was then sutured to the sclera in each quadrant using 5-0 nylon sutures passed partial thickness through the sclera in a horizontal mattress fashion. The scleral buckle was thentightened to the appropriate height with two locking needle drivers. Attention was then turned to the vitrectomy portion of the procedure. A 25gauge trocar was placed in the inferotemporal quadrant in a beveled fashion. A 4 mm infusion cannula was placed through this trocar, and the infusion cannula was confirmed in the vitreous cavity with no incarceration of retina or choroid prior to turning it on. Two additional 25gauge trocars were placed in the superonasal and superotemporal quadrants(2 and 10 oclock, respectively)in a similar beveled fashion. First, anterior vitrectomy was performed via direct visualization to remove the anterior hyaloid. Next,a standard three-port pars plana vitrectomy was performed using the light pipe, the cutter, and the BIOM viewing system. A thoroughanterior,core and peripheral vitreous dissection was performed. A posterior vitreous detachment was confirmed over the optic nerve.There was a subtotal inferior retinal detachment from 0200 to 1130involving the macula and fovea. There were multiple tearswithin the detached retina at 0300, 0730, and 0800 oclock. Traction was meticulously removed from all retinal breaks. The breaks weretrimmed using the cutter to smooth the edges.Perfluoron was injected to push the subretinal fluid anterior to the scleral buckle. A complete fluid-air exchange was performed with a soft tip extrusion cannula over the 0300 and 0730 breaks, then posteriorly to remove the  perfluoron. After completion of these maneuvers, the retina was flat over the macula and over the scleral buckle. Under air, endolaser was applied to all the breaks and over and posterior to the scleral buckle. At this time, the buckle  height was confirmed and the buckle was finalized by trimming the band ends.The superonasal trocar wasremoved and sutured with 7-0 vicryl in an interrupted fashion.A complete air to14%C3F8gas exchange was performed through the infusion cannula and vented through the superotemporal trocar using the extrusion cannula.Thesuperonasal trocar andinfusion cannula and associated trocar werethen removed and sutured with 7-0 vicryl in an interrupted fashion. Kefzol+ polymixinirrigation wasthenused over the buckle. A subtenon's block containing 0.75% marcaine and 2% lidocaine was administered. The conjunctiva was closed with 7-0 vicryl sutures. The eye's intraocular pressurewas confirmed to be at a physiologic level by digital palpation. Subconjunctival injections of Antibiotic and kenalogwere administered. The lid speculum and drapes were removed. Drops of an antibiotic, antihypertensives, and steroid were given. Copious antibiotic ointment was instilled into the eye. The eye was patched and shielded. The patient tolerated the procedure well without any intraoperative or immediate postoperative complications. The patient was taken to the recovery room in good condition. The patient was instructed to maintain a strict face-down position andwill be seen by Dr. Baltazar Najjar in clinic.

## 2020-03-05 NOTE — Interval H&P Note (Signed)
History and Physical Interval Note:  03/05/2020 2:59 PM  Joshua Burton  has presented today for surgery, with the diagnosis of left eye, retinal detachment.  The various methods of treatment have been discussed with the patient and family. After consideration of risks, benefits and other options for treatment, the patient has consented to  Procedure(s): VITRECTOMY 25 GAUGE WITH SCLERAL BUCKLE (Left) as a surgical intervention.  The patient's history has been reviewed, patient examined, no change in status, stable for surgery.  I have reviewed the patient's chart and labs.  Questions were answered to the patient's satisfaction.     [name]

## 2020-03-05 NOTE — Discharge Instructions (Addendum)
POSTOPERATIVE INSTRUCTIONS  Your doctor has performed vitreoretinal surgery on you at [facility]. [place] eye patched and shielded until seen by Dr. Coralyn Pear 830 AM tomorrow in clinic - Do not use drops until return - [place] - Sleep with belly down or on right side, avoid laying flat on back.    - No strenuous bending, stooping or lifting.  - You may not drive until further notice.  - If your doctor used a gas bubble in your eye during the procedure he will advise you on postoperative positioning. If you have a gas bubble you will be wearing a green bracelet that was applied in the operating room. The green bracelet should stay on as long as the gas bubble is in your eye. While the gas bubble is present you should not fly in an airplane. If you require general anesthesia while the gas bubble is present you must notify your anesthesiologist that an intraocular gas bubble is present so he can take the appropriate precautions.  - Tylenol or any other over-the-counter pain reliever can be used according to your doctor. If more pain medicine is required, your doctor will have a prescription for you.  - You may read, go up and down stairs, and watch television.     [name], M.D., Ph.D.

## 2020-03-05 NOTE — Anesthesia Procedure Notes (Signed)
Procedure Name: Intubation Date/Time: 03/05/2020 3:41 PM Performed by: Eligha Bridegroom, CRNA Pre-anesthesia Checklist: Patient identified, Emergency Drugs available, Suction available, Patient being monitored and Timeout performed Patient Re-evaluated:Patient Re-evaluated prior to induction Oxygen Delivery Method: Circle system utilized Preoxygenation: Pre-oxygenation with 100% oxygen Induction Type: IV induction Ventilation: Mask ventilation without difficulty and Oral airway inserted - appropriate to patient size Laryngoscope Size: Mac and 4 Grade View: Grade II Tube type: Oral Tube size: 7.5 mm Number of attempts: 1 Airway Equipment and Method: Stylet Placement Confirmation: ETT inserted through vocal cords under direct vision,  positive ETCO2 and breath sounds checked- equal and bilateral Secured at: 23 cm Tube secured with: Tape

## 2020-03-05 NOTE — Brief Op Note (Signed)
03/05/2020  7:38 PM  PATIENT:  Joshua Burton  68 y.o. male  PRE-OPERATIVE DIAGNOSIS:  left eye, retinal detachment  POST-OPERATIVE DIAGNOSIS:  left eye, retinal detachment  PROCEDURE:  Procedure(s): VITRECTOMY 25 GAUGE WITH SCLERAL BUCKLE (Left)  SURGEON:  Surgeon(s) and Role:    [name], MD - Primary  ASSISTANTS: Ernest Mallick, Ophthalmic Assistant    ANESTHESIA:   local and general  EBL:  0 mL   BLOOD ADMINISTERED:none  DRAINS: none   LOCAL MEDICATIONS USED:  MARCAINE   , LIDOCAINE  and Amount: 10 ml  SPECIMEN:  No Specimen  DISPOSITION OF SPECIMEN:  N/A  COUNTS:  YES  TOURNIQUET:  * No tourniquets in log *  DICTATION: .Note written in EPIC  PLAN OF CARE: Discharge to home after PACU  PATIENT DISPOSITION:  PACU - hemodynamically stable.   Delay start of Pharmacological VTE agent (>24hrs) due to surgical blood loss or risk of bleeding: not applicable

## 2020-03-06 ENCOUNTER — Ambulatory Visit (INDEPENDENT_AMBULATORY_CARE_PROVIDER_SITE_OTHER): Payer: Medicare HMO | Admitting: Ophthalmology

## 2020-03-06 ENCOUNTER — Encounter (INDEPENDENT_AMBULATORY_CARE_PROVIDER_SITE_OTHER): Payer: Self-pay | Admitting: Ophthalmology

## 2020-03-06 DIAGNOSIS — H3581 Retinal edema: Secondary | ICD-10-CM

## 2020-03-06 DIAGNOSIS — Z961 Presence of intraocular lens: Secondary | ICD-10-CM

## 2020-03-06 DIAGNOSIS — H3322 Serous retinal detachment, left eye: Secondary | ICD-10-CM

## 2020-03-06 DIAGNOSIS — I10 Essential (primary) hypertension: Secondary | ICD-10-CM

## 2020-03-06 DIAGNOSIS — H35033 Hypertensive retinopathy, bilateral: Secondary | ICD-10-CM

## 2020-03-06 DIAGNOSIS — Z8669 Personal history of other diseases of the nervous system and sense organs: Secondary | ICD-10-CM

## 2020-03-06 DIAGNOSIS — H35373 Puckering of macula, bilateral: Secondary | ICD-10-CM

## 2020-03-09 ENCOUNTER — Encounter (INDEPENDENT_AMBULATORY_CARE_PROVIDER_SITE_OTHER): Payer: Self-pay | Admitting: Ophthalmology
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
## 2020-03-13 ENCOUNTER — Ambulatory Visit (INDEPENDENT_AMBULATORY_CARE_PROVIDER_SITE_OTHER): Payer: Medicare HMO | Admitting: Ophthalmology

## 2020-03-13 DIAGNOSIS — H35033 Hypertensive retinopathy, bilateral: Secondary | ICD-10-CM

## 2020-03-13 DIAGNOSIS — H3322 Serous retinal detachment, left eye: Secondary | ICD-10-CM

## 2020-03-13 DIAGNOSIS — I10 Essential (primary) hypertension: Secondary | ICD-10-CM

## 2020-03-13 DIAGNOSIS — Z8669 Personal history of other diseases of the nervous system and sense organs: Secondary | ICD-10-CM

## 2020-03-13 DIAGNOSIS — H35373 Puckering of macula, bilateral: Secondary | ICD-10-CM

## 2020-03-13 DIAGNOSIS — H3581 Retinal edema: Secondary | ICD-10-CM

## 2020-03-13 DIAGNOSIS — Z961 Presence of intraocular lens: Secondary | ICD-10-CM

## 2020-03-13 MED ORDER — PREDNISOLONE ACETATE 1 % OP SUSP
1.0000 [drp] | Freq: Four times a day (QID) | OPHTHALMIC | 0 refills | Status: DC
Start: 2020-03-13 — End: 2020-05-15

## 2020-03-13 MED ORDER — BACITRACIN-POLYMYXIN B 500-10000 UNIT/GM OP OINT
TOPICAL_OINTMENT | Freq: Four times a day (QID) | OPHTHALMIC | 3 refills | Status: DC
Start: 2020-03-13 — End: 2021-07-01

## 2020-03-16 ENCOUNTER — Encounter (INDEPENDENT_AMBULATORY_CARE_PROVIDER_SITE_OTHER): Payer: Self-pay | Admitting: Ophthalmology
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
## 2020-04-03 DIAGNOSIS — N400 Enlarged prostate without lower urinary tract symptoms: Secondary | ICD-10-CM | POA: Diagnosis not present

## 2020-04-03 DIAGNOSIS — I10 Essential (primary) hypertension: Secondary | ICD-10-CM | POA: Diagnosis not present

## 2020-04-03 DIAGNOSIS — M1990 Unspecified osteoarthritis, unspecified site: Secondary | ICD-10-CM | POA: Diagnosis not present

## 2020-04-08 ENCOUNTER — Ambulatory Visit (INDEPENDENT_AMBULATORY_CARE_PROVIDER_SITE_OTHER): Payer: Medicare HMO | Admitting: Ophthalmology

## 2020-04-08 ENCOUNTER — Encounter (INDEPENDENT_AMBULATORY_CARE_PROVIDER_SITE_OTHER): Payer: Self-pay | Admitting: Ophthalmology

## 2020-04-08 ENCOUNTER — Other Ambulatory Visit: Payer: Self-pay

## 2020-04-08 DIAGNOSIS — H3322 Serous retinal detachment, left eye: Secondary | ICD-10-CM

## 2020-04-08 DIAGNOSIS — H35033 Hypertensive retinopathy, bilateral: Secondary | ICD-10-CM

## 2020-04-08 DIAGNOSIS — Z8669 Personal history of other diseases of the nervous system and sense organs: Secondary | ICD-10-CM

## 2020-04-08 DIAGNOSIS — Z961 Presence of intraocular lens: Secondary | ICD-10-CM

## 2020-04-08 DIAGNOSIS — I10 Essential (primary) hypertension: Secondary | ICD-10-CM

## 2020-04-08 DIAGNOSIS — H3581 Retinal edema: Secondary | ICD-10-CM

## 2020-04-08 DIAGNOSIS — H35373 Puckering of macula, bilateral: Secondary | ICD-10-CM

## 2020-05-05 DIAGNOSIS — N5201 Erectile dysfunction due to arterial insufficiency: Secondary | ICD-10-CM | POA: Diagnosis not present
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
## 2020-05-15 ENCOUNTER — Other Ambulatory Visit: Payer: Self-pay

## 2020-05-15 ENCOUNTER — Encounter (INDEPENDENT_AMBULATORY_CARE_PROVIDER_SITE_OTHER): Payer: Self-pay | Admitting: Ophthalmology

## 2020-05-15 ENCOUNTER — Ambulatory Visit (INDEPENDENT_AMBULATORY_CARE_PROVIDER_SITE_OTHER): Payer: Medicare HMO | Admitting: Ophthalmology

## 2020-05-15 DIAGNOSIS — H3581 Retinal edema: Secondary | ICD-10-CM

## 2020-05-15 DIAGNOSIS — H3322 Serous retinal detachment, left eye: Secondary | ICD-10-CM

## 2020-05-15 DIAGNOSIS — Z8669 Personal history of other diseases of the nervous system and sense organs: Secondary | ICD-10-CM

## 2020-05-15 DIAGNOSIS — H35373 Puckering of macula, bilateral: Secondary | ICD-10-CM

## 2020-05-15 DIAGNOSIS — H35033 Hypertensive retinopathy, bilateral: Secondary | ICD-10-CM

## 2020-05-15 DIAGNOSIS — Z961 Presence of intraocular lens: Secondary | ICD-10-CM

## 2020-05-15 DIAGNOSIS — I10 Essential (primary) hypertension: Secondary | ICD-10-CM

## 2020-05-15 MED ORDER — PREDNISOLONE ACETATE 1 % OP SUSP: 1.0000 [drp] | Freq: Two times a day (BID) | OPHTHALMIC | 0 refills | Status: DC

## 2020-06-01 ENCOUNTER — Telehealth (INDEPENDENT_AMBULATORY_CARE_PROVIDER_SITE_OTHER): Payer: Self-pay

## 2020-06-02 ENCOUNTER — Encounter (INDEPENDENT_AMBULATORY_CARE_PROVIDER_SITE_OTHER): Payer: Medicare HMO | Admitting: Ophthalmology

## 2020-06-02 DIAGNOSIS — L821 Other seborrheic keratosis: Secondary | ICD-10-CM | POA: Diagnosis not present

## 2020-06-02 DIAGNOSIS — D225 Melanocytic nevi of trunk: Secondary | ICD-10-CM | POA: Diagnosis not present

## 2020-06-02 DIAGNOSIS — C4441 Basal cell carcinoma of skin of scalp and neck: Secondary | ICD-10-CM | POA: Diagnosis not present

## 2020-06-02 DIAGNOSIS — S30860A Insect bite (nonvenomous) of lower back and pelvis, initial encounter: Secondary | ICD-10-CM | POA: Diagnosis not present
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
## 2020-06-19 ENCOUNTER — Other Ambulatory Visit: Payer: Self-pay

## 2020-06-19 ENCOUNTER — Ambulatory Visit (INDEPENDENT_AMBULATORY_CARE_PROVIDER_SITE_OTHER): Payer: Medicare HMO | Admitting: Ophthalmology

## 2020-06-19 ENCOUNTER — Encounter (INDEPENDENT_AMBULATORY_CARE_PROVIDER_SITE_OTHER): Payer: Self-pay | Admitting: Ophthalmology

## 2020-06-19 DIAGNOSIS — Z961 Presence of intraocular lens: Secondary | ICD-10-CM

## 2020-06-19 DIAGNOSIS — H3322 Serous retinal detachment, left eye: Secondary | ICD-10-CM | POA: Diagnosis not present

## 2020-06-19 DIAGNOSIS — H35033 Hypertensive retinopathy, bilateral: Secondary | ICD-10-CM

## 2020-06-19 DIAGNOSIS — H35373 Puckering of macula, bilateral: Secondary | ICD-10-CM | POA: Diagnosis not present

## 2020-06-19 DIAGNOSIS — H40003 Preglaucoma, unspecified, bilateral: Secondary | ICD-10-CM

## 2020-06-19 DIAGNOSIS — Z8669 Personal history of other diseases of the nervous system and sense organs: Secondary | ICD-10-CM | POA: Diagnosis not present

## 2020-06-19 DIAGNOSIS — I10 Essential (primary) hypertension: Secondary | ICD-10-CM

## 2020-06-19 DIAGNOSIS — H3581 Retinal edema: Secondary | ICD-10-CM

## 2020-06-23 DIAGNOSIS — L570 Actinic keratosis: Secondary | ICD-10-CM | POA: Diagnosis not present

## 2020-06-23 DIAGNOSIS — Z85828 Personal history of other malignant neoplasm of skin: Secondary | ICD-10-CM | POA: Diagnosis not present

## 2020-06-23 DIAGNOSIS — C4441 Basal cell carcinoma of skin of scalp and neck: Secondary | ICD-10-CM | POA: Diagnosis not present

## 2020-06-23 DIAGNOSIS — X32XXXD Exposure to sunlight, subsequent encounter: Secondary | ICD-10-CM | POA: Diagnosis not present

## 2020-06-23 DIAGNOSIS — Z08 Encounter for follow-up examination after completed treatment for malignant neoplasm: Secondary | ICD-10-CM | POA: Diagnosis not present

## 2020-07-14 DIAGNOSIS — N400 Enlarged prostate without lower urinary tract symptoms: Secondary | ICD-10-CM | POA: Diagnosis not present

## 2020-07-14 DIAGNOSIS — R739 Hyperglycemia, unspecified: Secondary | ICD-10-CM | POA: Diagnosis not present

## 2020-07-14 DIAGNOSIS — Z136 Encounter for screening for cardiovascular disorders: Secondary | ICD-10-CM | POA: Diagnosis not present

## 2020-07-14 DIAGNOSIS — D692 Other nonthrombocytopenic purpura: Secondary | ICD-10-CM | POA: Diagnosis not present

## 2020-07-14 DIAGNOSIS — Z862 Personal history of diseases of the blood and blood-forming organs and certain disorders involving the immune mechanism: Secondary | ICD-10-CM | POA: Diagnosis not present

## 2020-07-14 DIAGNOSIS — K7460 Unspecified cirrhosis of liver: Secondary | ICD-10-CM | POA: Diagnosis not present

## 2020-07-14 DIAGNOSIS — I10 Essential (primary) hypertension: Secondary | ICD-10-CM | POA: Diagnosis not present

## 2020-07-14 DIAGNOSIS — Z131 Encounter for screening for diabetes mellitus: Secondary | ICD-10-CM | POA: Diagnosis not present

## 2020-07-14 DIAGNOSIS — B1920 Unspecified viral hepatitis C without hepatic coma: Secondary | ICD-10-CM | POA: Diagnosis not present

## 2020-07-21 DIAGNOSIS — C44319 Basal cell carcinoma of skin of other parts of face: Secondary | ICD-10-CM | POA: Diagnosis not present

## 2020-07-21 DIAGNOSIS — C4441 Basal cell carcinoma of skin of scalp and neck: Secondary | ICD-10-CM | POA: Diagnosis not present

## 2020-07-21 DIAGNOSIS — Z85828 Personal history of other malignant neoplasm of skin: Secondary | ICD-10-CM | POA: Diagnosis not present

## 2020-07-21 DIAGNOSIS — X32XXXD Exposure to sunlight, subsequent encounter: Secondary | ICD-10-CM | POA: Diagnosis not present

## 2020-07-21 DIAGNOSIS — Z08 Encounter for follow-up examination after completed treatment for malignant neoplasm: Secondary | ICD-10-CM | POA: Diagnosis not present

## 2020-07-21 DIAGNOSIS — L570 Actinic keratosis: Secondary | ICD-10-CM | POA: Diagnosis not present

## 2020-08-03 DIAGNOSIS — H40023 Open angle with borderline findings, high risk, bilateral: Secondary | ICD-10-CM | POA: Diagnosis not present

## 2020-08-03 DIAGNOSIS — H04123 Dry eye syndrome of bilateral lacrimal glands: Secondary | ICD-10-CM | POA: Diagnosis not present

## 2020-09-01 DIAGNOSIS — Z85828 Personal history of other malignant neoplasm of skin: Secondary | ICD-10-CM | POA: Diagnosis not present

## 2020-09-01 DIAGNOSIS — L570 Actinic keratosis: Secondary | ICD-10-CM | POA: Diagnosis not present

## 2020-09-01 DIAGNOSIS — X32XXXD Exposure to sunlight, subsequent encounter: Secondary | ICD-10-CM | POA: Diagnosis not present

## 2020-09-01 DIAGNOSIS — C4441 Basal cell carcinoma of skin of scalp and neck: Secondary | ICD-10-CM | POA: Diagnosis not present

## 2020-09-01 DIAGNOSIS — Z08 Encounter for follow-up examination after completed treatment for malignant neoplasm: Secondary | ICD-10-CM | POA: Diagnosis not present

## 2020-09-03 ENCOUNTER — Ambulatory Visit (INDEPENDENT_AMBULATORY_CARE_PROVIDER_SITE_OTHER): Payer: Medicare HMO

## 2020-09-03 ENCOUNTER — Other Ambulatory Visit: Payer: Self-pay

## 2020-09-03 ENCOUNTER — Ambulatory Visit: Payer: Medicare HMO | Admitting: Orthopaedic Surgery

## 2020-09-03 ENCOUNTER — Encounter: Payer: Self-pay | Admitting: Orthopaedic Surgery

## 2020-09-03 VITALS — Ht 71.0 in | Wt 210.0 lb

## 2020-09-03 DIAGNOSIS — M25511 Pain in right shoulder: Secondary | ICD-10-CM

## 2020-09-03 DIAGNOSIS — G8929 Other chronic pain: Secondary | ICD-10-CM
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
## 2020-09-04 ENCOUNTER — Ambulatory Visit: Payer: Medicare HMO | Admitting: Orthopaedic Surgery

## 2020-09-07 DIAGNOSIS — H401132 Primary open-angle glaucoma, bilateral, moderate stage: Secondary | ICD-10-CM | POA: Diagnosis not present

## 2020-09-07 DIAGNOSIS — H04123 Dry eye syndrome of bilateral lacrimal glands: Secondary | ICD-10-CM | POA: Diagnosis not present

## 2020-09-08 ENCOUNTER — Telehealth: Payer: Self-pay | Admitting: Orthopaedic Surgery

## 2020-09-08 ENCOUNTER — Other Ambulatory Visit: Payer: Self-pay

## 2020-09-08 DIAGNOSIS — G8929 Other chronic pain: Secondary | ICD-10-CM

## 2020-09-08 NOTE — Telephone Encounter (Signed)
Yes I thought we already ordered it.

## 2020-09-08 NOTE — Telephone Encounter (Signed)
I didn't see any order. Order made. They will call to schedule appt.

## 2020-09-08 NOTE — Telephone Encounter (Signed)
Patient called requesting a call back. Patient states Dr. Erlinda Hong stated to him that he would send a referral for pt to have a MRI. Please call patient about this matter at [phone].
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
## 2020-09-18 ENCOUNTER — Ambulatory Visit (INDEPENDENT_AMBULATORY_CARE_PROVIDER_SITE_OTHER): Payer: Medicare HMO | Admitting: Ophthalmology

## 2020-09-18 ENCOUNTER — Encounter (INDEPENDENT_AMBULATORY_CARE_PROVIDER_SITE_OTHER): Payer: Self-pay | Admitting: Ophthalmology

## 2020-09-18 ENCOUNTER — Other Ambulatory Visit: Payer: Self-pay

## 2020-09-18 DIAGNOSIS — I10 Essential (primary) hypertension: Secondary | ICD-10-CM | POA: Diagnosis not present

## 2020-09-18 DIAGNOSIS — H3581 Retinal edema: Secondary | ICD-10-CM | POA: Diagnosis not present

## 2020-09-18 DIAGNOSIS — H35373 Puckering of macula, bilateral: Secondary | ICD-10-CM

## 2020-09-18 DIAGNOSIS — H401130 Primary open-angle glaucoma, bilateral, stage unspecified: Secondary | ICD-10-CM | POA: Diagnosis not present

## 2020-09-18 DIAGNOSIS — H35033 Hypertensive retinopathy, bilateral: Secondary | ICD-10-CM

## 2020-09-18 DIAGNOSIS — H3322 Serous retinal detachment, left eye: Secondary | ICD-10-CM

## 2020-09-18 DIAGNOSIS — Z961 Presence of intraocular lens: Secondary | ICD-10-CM | POA: Diagnosis not present

## 2020-09-18 DIAGNOSIS — H40003 Preglaucoma, unspecified, bilateral: Secondary | ICD-10-CM

## 2020-09-18 DIAGNOSIS — Z8669 Personal history of other diseases of the nervous system and sense organs: Secondary | ICD-10-CM | POA: Diagnosis not present

## 2020-09-20 ENCOUNTER — Encounter (INDEPENDENT_AMBULATORY_CARE_PROVIDER_SITE_OTHER): Payer: Self-pay | Admitting: Ophthalmology

## 2020-09-23 DIAGNOSIS — H401132 Primary open-angle glaucoma, bilateral, moderate stage: Secondary | ICD-10-CM | POA: Diagnosis not present

## 2020-09-25 DIAGNOSIS — Z961 Presence of intraocular lens: Secondary | ICD-10-CM | POA: Diagnosis not present

## 2020-09-25 DIAGNOSIS — Z9841 Cataract extraction status, right eye: Secondary | ICD-10-CM | POA: Diagnosis not present

## 2020-09-25 DIAGNOSIS — Z9889 Other specified postprocedural states: Secondary | ICD-10-CM | POA: Diagnosis not present

## 2020-09-25 DIAGNOSIS — H409 Unspecified glaucoma: Secondary | ICD-10-CM | POA: Diagnosis not present

## 2020-09-25 DIAGNOSIS — H33003 Unspecified retinal detachment with retinal break, bilateral: Secondary | ICD-10-CM | POA: Diagnosis not present

## 2020-09-25 DIAGNOSIS — Z9842 Cataract extraction status, left eye: Secondary | ICD-10-CM | POA: Diagnosis not present

## 2020-09-25 DIAGNOSIS — H35033 Hypertensive retinopathy, bilateral: Secondary | ICD-10-CM | POA: Diagnosis not present

## 2020-09-25 DIAGNOSIS — Z48810 Encounter for surgical aftercare following surgery on the sense organs: Secondary | ICD-10-CM | POA: Diagnosis not present

## 2020-09-25 DIAGNOSIS — H35373 Puckering of macula, bilateral: Secondary | ICD-10-CM | POA: Diagnosis not present

## 2020-09-28 ENCOUNTER — Ambulatory Visit
Admission: RE | Admit: 2020-09-28 | Discharge: 2020-09-28 | Disposition: A | Discharge status: Home / Self Care | Payer: Medicare HMO | Source: Ambulatory Visit | Attending: Orthopaedic Surgery | Admitting: Orthopaedic Surgery

## 2020-09-28 ENCOUNTER — Other Ambulatory Visit: Payer: Self-pay

## 2020-09-28 DIAGNOSIS — S43004A Unspecified dislocation of right shoulder joint, initial encounter: Secondary | ICD-10-CM | POA: Diagnosis not present

## 2020-09-28 DIAGNOSIS — G8929 Other chronic pain: Secondary | ICD-10-CM | POA: Diagnosis not present

## 2020-09-28 DIAGNOSIS — S46111A Strain of muscle, fascia and tendon of long head of biceps, right arm, initial encounter: Secondary | ICD-10-CM | POA: Diagnosis not present

## 2020-09-28 DIAGNOSIS — S4381XA Sprain of other specified parts of right shoulder girdle, initial encounter: Secondary | ICD-10-CM | POA: Diagnosis not present

## 2020-10-06 ENCOUNTER — Ambulatory Visit: Payer: Medicare HMO | Admitting: Orthopaedic Surgery

## 2020-10-06 ENCOUNTER — Encounter: Payer: Self-pay | Admitting: Orthopaedic Surgery

## 2020-10-06 VITALS — Ht 71.0 in | Wt 210.0 lb

## 2020-10-06 DIAGNOSIS — S46811A Strain of other muscles, fascia and tendons at shoulder and upper arm level, right arm, initial encounter: Secondary | ICD-10-CM
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
## 2020-10-13 DIAGNOSIS — X32XXXD Exposure to sunlight, subsequent encounter: Secondary | ICD-10-CM | POA: Diagnosis not present

## 2020-10-13 DIAGNOSIS — Z85820 Personal history of malignant melanoma of skin: Secondary | ICD-10-CM | POA: Diagnosis not present

## 2020-10-13 DIAGNOSIS — Z85828 Personal history of other malignant neoplasm of skin: Secondary | ICD-10-CM | POA: Diagnosis not present

## 2020-10-13 DIAGNOSIS — L570 Actinic keratosis: Secondary | ICD-10-CM | POA: Diagnosis not present

## 2020-10-13 DIAGNOSIS — Z08 Encounter for follow-up examination after completed treatment for malignant neoplasm: Secondary | ICD-10-CM | POA: Diagnosis not present

## 2020-12-10 DIAGNOSIS — I10 Essential (primary) hypertension: Secondary | ICD-10-CM | POA: Diagnosis not present

## 2020-12-10 DIAGNOSIS — N400 Enlarged prostate without lower urinary tract symptoms: Secondary | ICD-10-CM | POA: Diagnosis not present

## 2020-12-10 DIAGNOSIS — M1990 Unspecified osteoarthritis, unspecified site: Secondary | ICD-10-CM | POA: Diagnosis not present

## 2021-02-03 DIAGNOSIS — X32XXXD Exposure to sunlight, subsequent encounter: Secondary | ICD-10-CM | POA: Diagnosis not present

## 2021-02-03 DIAGNOSIS — L570 Actinic keratosis: Secondary | ICD-10-CM | POA: Diagnosis not present
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
## 2021-03-18 ENCOUNTER — Encounter (INDEPENDENT_AMBULATORY_CARE_PROVIDER_SITE_OTHER): Payer: Medicare HMO | Admitting: Ophthalmology

## 2021-03-18 DIAGNOSIS — H35033 Hypertensive retinopathy, bilateral: Secondary | ICD-10-CM

## 2021-03-18 DIAGNOSIS — Z8669 Personal history of other diseases of the nervous system and sense organs: Secondary | ICD-10-CM

## 2021-03-18 DIAGNOSIS — H3581 Retinal edema: Secondary | ICD-10-CM

## 2021-03-18 DIAGNOSIS — H35373 Puckering of macula, bilateral: Secondary | ICD-10-CM

## 2021-03-18 DIAGNOSIS — I10 Essential (primary) hypertension: Secondary | ICD-10-CM

## 2021-03-18 DIAGNOSIS — Z961 Presence of intraocular lens: Secondary | ICD-10-CM

## 2021-03-18 DIAGNOSIS — H3322 Serous retinal detachment, left eye: Secondary | ICD-10-CM

## 2021-03-19 ENCOUNTER — Encounter (INDEPENDENT_AMBULATORY_CARE_PROVIDER_SITE_OTHER): Payer: Medicare HMO | Admitting: Ophthalmology
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
## 2021-04-14 ENCOUNTER — Ambulatory Visit (INDEPENDENT_AMBULATORY_CARE_PROVIDER_SITE_OTHER): Payer: Medicare HMO | Admitting: Ophthalmology

## 2021-04-14 ENCOUNTER — Other Ambulatory Visit: Payer: Self-pay

## 2021-04-14 ENCOUNTER — Encounter (INDEPENDENT_AMBULATORY_CARE_PROVIDER_SITE_OTHER): Payer: Self-pay | Admitting: Ophthalmology

## 2021-04-14 DIAGNOSIS — H35373 Puckering of macula, bilateral: Secondary | ICD-10-CM | POA: Diagnosis not present

## 2021-04-14 DIAGNOSIS — Z8669 Personal history of other diseases of the nervous system and sense organs: Secondary | ICD-10-CM | POA: Diagnosis not present

## 2021-04-14 DIAGNOSIS — H35033 Hypertensive retinopathy, bilateral: Secondary | ICD-10-CM

## 2021-04-14 DIAGNOSIS — H3581 Retinal edema: Secondary | ICD-10-CM

## 2021-04-14 DIAGNOSIS — H401130 Primary open-angle glaucoma, bilateral, stage unspecified: Secondary | ICD-10-CM | POA: Diagnosis not present

## 2021-04-14 DIAGNOSIS — Z961 Presence of intraocular lens: Secondary | ICD-10-CM | POA: Diagnosis not present

## 2021-04-14 DIAGNOSIS — I10 Essential (primary) hypertension: Secondary | ICD-10-CM | POA: Diagnosis not present

## 2021-04-14 DIAGNOSIS — H3322 Serous retinal detachment, left eye: Secondary | ICD-10-CM | POA: Diagnosis not present

## 2021-05-04 DIAGNOSIS — K7460 Unspecified cirrhosis of liver: Secondary | ICD-10-CM | POA: Diagnosis not present

## 2021-05-04 DIAGNOSIS — R202 Paresthesia of skin: Secondary | ICD-10-CM | POA: Diagnosis not present

## 2021-05-04 DIAGNOSIS — M19031 Primary osteoarthritis, right wrist: Secondary | ICD-10-CM | POA: Diagnosis not present

## 2021-05-04 DIAGNOSIS — R200 Anesthesia of skin: Secondary | ICD-10-CM | POA: Diagnosis not present

## 2021-06-02 DIAGNOSIS — I700 Atherosclerosis of aorta: Secondary | ICD-10-CM | POA: Diagnosis not present

## 2021-06-02 DIAGNOSIS — I10 Essential (primary) hypertension: Secondary | ICD-10-CM | POA: Diagnosis not present

## 2021-06-02 DIAGNOSIS — N529 Male erectile dysfunction, unspecified: Secondary | ICD-10-CM | POA: Diagnosis not present

## 2021-06-02 DIAGNOSIS — Z0000 Encounter for general adult medical examination without abnormal findings: Secondary | ICD-10-CM | POA: Diagnosis not present

## 2021-06-03 DIAGNOSIS — Z0000 Encounter for general adult medical examination without abnormal findings: Secondary | ICD-10-CM | POA: Diagnosis not present

## 2021-06-09 DIAGNOSIS — G5601 Carpal tunnel syndrome, right upper limb: Secondary | ICD-10-CM | POA: Diagnosis not present

## 2021-06-10 ENCOUNTER — Other Ambulatory Visit: Payer: Self-pay

## 2021-06-10 ENCOUNTER — Encounter: Payer: Self-pay | Admitting: Orthopaedic Surgery

## 2021-06-10 ENCOUNTER — Ambulatory Visit (INDEPENDENT_AMBULATORY_CARE_PROVIDER_SITE_OTHER): Payer: Medicare HMO

## 2021-06-10 ENCOUNTER — Ambulatory Visit (INDEPENDENT_AMBULATORY_CARE_PROVIDER_SITE_OTHER): Payer: Medicare HMO | Admitting: Orthopaedic Surgery

## 2021-06-10 DIAGNOSIS — M1712 Unilateral primary osteoarthritis, left knee: Secondary | ICD-10-CM | POA: Diagnosis not present

## 2021-06-10 DIAGNOSIS — K7030 Alcoholic cirrhosis of liver without ascites: Secondary | ICD-10-CM | POA: Diagnosis not present
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
## 2021-06-15 ENCOUNTER — Other Ambulatory Visit: Payer: Self-pay

## 2021-06-24 DIAGNOSIS — G5601 Carpal tunnel syndrome, right upper limb: Secondary | ICD-10-CM | POA: Diagnosis not present

## 2021-07-07 NOTE — Pre-Procedure Instructions (Signed)
Surgical Instructions    Your procedure is scheduled on Monday, September 12th, 2022.  Report to [facility] Main Entrance "A" at 10:15 A.M., then check in with the Admitting office.  Call this number if you have problems the morning of surgery:  [phone]   If you have any questions prior to your surgery date call [phone]: Open Monday-Friday 8am-4pm    Remember:  Do not eat after midnight the night before your surgery  You may drink clear liquids until 09:15 A.M. the morning of your surgery.   Clear liquids allowed are: Water, Non-Citrus Juices (without pulp), Carbonated Beverages, Clear Tea, Black Coffee ONLY (NO MILK, CREAM OR POWDERED CREAMER of any kind), and Gatorade   Enhanced Recovery after Surgery for Orthopedics Enhanced Recovery after Surgery is a protocol used to improve the stress on your body and your recovery after surgery.  Patient Instructions  The day of surgery (if you do NOT have diabetes):  Drink ONE (1) Pre-Surgery Clear Ensure by 09:15 am the morning of surgery   This drink was given to you during your hospital  pre-op appointment visit. Nothing else to drink after completing the  Pre-Surgery Clear Ensure.         If you have questions, please contact your surgeon's office.     Take these medicines the morning of surgery with A SIP OF WATER: cloNIDine (CATAPRES)   If needed: Polyethyl Glycol-Propyl Glycol (SYSTANE ULTRA)-eye drops    As of today, STOP taking any meloxicam (MOBIC), Aspirin (unless otherwise instructed by your surgeon) Aleve, Naproxen, Ibuprofen, Motrin, Advil, Goody's, BC's, all herbal medications, fish oil, and all vitamins.          Do not wear jewelry or makeup Do not wear lotions, powders, colognes, or deodorant. Men may shave face and neck. Do not bring valuables to the hospital.             [facility] is not responsible for any belongings or valuables.  Do NOT Smoke (Tobacco/Vaping) or drink Alcohol 24 hours prior to  your procedure If you use a CPAP at night, you may bring all equipment for your overnight stay.   Contacts, glasses, dentures or bridgework may not be worn into surgery, please bring cases for these belongings   For patients admitted to the hospital, discharge time will be determined by your treatment team.   Patients discharged the day of surgery will not be allowed to drive home, and someone needs to stay with them for 24 hours.  ONLY 1 SUPPORT PERSON MAY BE PRESENT WHILE YOU ARE IN SURGERY. IF YOU ARE TO BE ADMITTED ONCE YOU ARE IN YOUR ROOM YOU WILL BE ALLOWED TWO (2) VISITORS.  Minor children may have two parents present. Special consideration for safety and communication needs will be reviewed on a case by case basis.  Special instructions:    Oral Hygiene is also important to reduce your risk of infection.  Remember - BRUSH YOUR TEETH THE MORNING OF SURGERY WITH YOUR REGULAR TOOTHPASTE   - Preparing For Surgery  Before surgery, you can play an important role. Because skin is not sterile, your skin needs to be as free of germs as possible. You can reduce the number of germs on your skin by washing with CHG (chlorahexidine gluconate) Soap before surgery.  CHG is an antiseptic cleaner which kills germs and bonds with the skin to continue killing germs even after washing.     Please do not use if you  have an allergy to CHG or antibacterial soaps. If your skin becomes reddened/irritated stop using the CHG.  Do not shave (including legs and underarms) for at least 48 hours prior to first CHG shower. It is OK to shave your face.  Please follow these instructions carefully.     Shower the NIGHT BEFORE SURGERY and the MORNING OF SURGERY with CHG Soap.   If you chose to wash your hair, wash your hair first as usual with your normal shampoo. After you shampoo, rinse your hair and body thoroughly to remove the shampoo.  Then [employer] and genitals (private parts) with your normal  soap and rinse thoroughly to remove soap.  After that Use CHG Soap as you would any other liquid soap. You can apply CHG directly to the skin and wash gently with a scrungie or a clean washcloth.   Apply the CHG Soap to your body ONLY FROM THE NECK DOWN.  Do not use on open wounds or open sores. Avoid contact with your eyes, ears, mouth and genitals (private parts). Wash Face and genitals (private parts)  with your normal soap.   Wash thoroughly, paying special attention to the area where your surgery will be performed.  Thoroughly rinse your body with warm water from the neck down.  DO NOT shower/wash with your normal soap after using and rinsing off the CHG Soap.  Pat yourself dry with a CLEAN TOWEL.  Wear CLEAN PAJAMAS to bed the night before surgery  Place CLEAN SHEETS on your bed the night before your surgery  DO NOT SLEEP WITH PETS.   Day of Surgery:  Take a shower with CHG soap. Wear Clean/Comfortable clothing the morning of surgery Do not apply any deodorants/lotions.   Remember to brush your teeth WITH YOUR REGULAR TOOTHPASTE.   Please read over the following fact sheets that you were given.

## 2021-07-08 ENCOUNTER — Inpatient Hospital Stay (HOSPITAL_COMMUNITY)
Admission: RE | Admit: 2021-07-08 | Discharge: 2021-07-08 | Disposition: A | Discharge status: Home / Self Care | Payer: Medicare HMO | Source: Ambulatory Visit

## 2021-07-08 NOTE — Progress Notes (Addendum)
Called patient at 1110 to make sure he was coming to his 1100 appt. No answer, left a message for him to call us at [phone].   1133 No call back and no show

## 2021-07-14 ENCOUNTER — Other Ambulatory Visit: Payer: Self-pay | Admitting: Physician Assistant

## 2021-07-14 MED ORDER — OXYCODONE-ACETAMINOPHEN 5-325 MG PO TABS: 1.0000 | ORAL_TABLET | Freq: Four times a day (QID) | ORAL | 0 refills | Status: DC | PRN

## 2021-07-14 MED ORDER — ASPIRIN EC 81 MG PO TBEC: 81.0000 mg | DELAYED_RELEASE_TABLET | Freq: Two times a day (BID) | ORAL | 0 refills | Status: DC

## 2021-07-14 MED ORDER — METHOCARBAMOL 500 MG PO TABS: 500.0000 mg | ORAL_TABLET | Freq: Two times a day (BID) | ORAL | 0 refills | Status: DC | PRN

## 2021-07-14 MED ORDER — DOCUSATE SODIUM 100 MG PO CAPS: 100.0000 mg | ORAL_CAPSULE | Freq: Every day | ORAL | 2 refills | Status: DC | PRN

## 2021-07-14 MED ORDER — ONDANSETRON HCL 4 MG PO TABS: 4.0000 mg | ORAL_TABLET | Freq: Three times a day (TID) | ORAL | 0 refills | Status: DC | PRN

## 2021-07-15 ENCOUNTER — Encounter (HOSPITAL_COMMUNITY)
Admission: RE | Admit: 2021-07-15 | Discharge: 2021-07-15 | Disposition: A | Discharge status: Home / Self Care | Payer: Medicare HMO | Source: Ambulatory Visit | Attending: Orthopaedic Surgery | Admitting: Orthopaedic Surgery

## 2021-07-15 ENCOUNTER — Encounter (HOSPITAL_COMMUNITY): Payer: Self-pay

## 2021-07-15 ENCOUNTER — Other Ambulatory Visit: Payer: Self-pay

## 2021-07-15 DIAGNOSIS — Z01818 Encounter for other preprocedural examination: Secondary | ICD-10-CM | POA: Insufficient documentation

## 2021-07-15 DIAGNOSIS — Z79899 Other long term (current) drug therapy: Secondary | ICD-10-CM | POA: Diagnosis not present

## 2021-07-15 DIAGNOSIS — Z7982 Long term (current) use of aspirin: Secondary | ICD-10-CM | POA: Insufficient documentation

## 2021-07-15 DIAGNOSIS — M1712 Unilateral primary osteoarthritis, left knee: Secondary | ICD-10-CM | POA: Diagnosis not present

## 2021-07-15 DIAGNOSIS — Z20822 Contact with and (suspected) exposure to covid-19: Secondary | ICD-10-CM | POA: Insufficient documentation

## 2021-07-15 DIAGNOSIS — Z8619 Personal history of other infectious and parasitic diseases: Secondary | ICD-10-CM | POA: Insufficient documentation

## 2021-07-15 DIAGNOSIS — I498 Other specified cardiac arrhythmias: Secondary | ICD-10-CM | POA: Diagnosis not present

## 2021-07-15 DIAGNOSIS — I10 Essential (primary) hypertension: Secondary | ICD-10-CM | POA: Insufficient documentation

## 2021-07-15 DIAGNOSIS — D696 Thrombocytopenia, unspecified: Secondary | ICD-10-CM | POA: Diagnosis not present

## 2021-07-15 LAB — COMPREHENSIVE METABOLIC PANEL
ALT: 23 U/L (ref 0–44)
AST: 26 U/L (ref 15–41)
Albumin: 4.3 g/dL (ref 3.5–5.0)
Alkaline Phosphatase: 72 U/L (ref 38–126)
Anion gap: 8 (ref 5–15)
BUN: 12 mg/dL (ref 8–23)
CO2: 25 mmol/L (ref 22–32)
Calcium: 9.2 mg/dL (ref 8.9–10.3)
Chloride: 102 mmol/L (ref 98–111)
Creatinine, Ser: 0.71 mg/dL (ref 0.61–1.24)
GFR, Estimated: >60 mL/min (ref >60)
Glucose, Bld: 86 mg/dL (ref 70–99)
Potassium: 3.7 mmol/L (ref 3.5–5.1)
Sodium: 135 mmol/L (ref 135–145)
Total Bilirubin: 0.8 mg/dL (ref 0.3–1.2)
Total Protein: 7.2 g/dL (ref 6.5–8.1)

## 2021-07-15 LAB — SURGICAL PCR SCREEN
MRSA, PCR: NEGATIVE
Staphylococcus aureus: NEGATIVE

## 2021-07-15 LAB — CBC WITH DIFFERENTIAL/PLATELET
Abs Immature Granulocytes: 0.01 10*3/uL (ref 0.00–0.07)
Basophils Absolute: 0.1 10*3/uL (ref 0.0–0.1)
Basophils Relative: 1 %
Eosinophils Absolute: 0.3 10*3/uL (ref 0.0–0.5)
Eosinophils Relative: 4 %
HCT: 45.4 % (ref 39.0–52.0)
Hemoglobin: 15.3 g/dL (ref 13.0–17.0)
Immature Granulocytes: 0 %
Lymphocytes Relative: 26 %
Lymphs Abs: 1.6 10*3/uL (ref 0.7–4.0)
MCH: 31.7 pg (ref 26.0–34.0)
MCHC: 33.7 g/dL (ref 30.0–36.0)
MCV: 94 fL (ref 80.0–100.0)
Monocytes Absolute: 0.6 10*3/uL (ref 0.1–1.0)
Monocytes Relative: 9 %
Neutro Abs: 3.7 10*3/uL (ref 1.7–7.7)
Neutrophils Relative %: 60 %
Platelets: 169 10*3/uL (ref 150–400)
RBC: 4.83 MIL/uL (ref 4.22–5.81)
RDW: 13.3 % (ref 11.5–15.5)
WBC: 6.1 10*3/uL (ref 4.0–10.5)
nRBC: 0 % (ref 0.0–0.2)

## 2021-07-15 LAB — URINALYSIS, COMPLETE (UACMP) WITH MICROSCOPIC
Bacteria, UA: NONE SEEN
Bilirubin Urine: NEGATIVE
Glucose, UA: NEGATIVE mg/dL
Hgb urine dipstick: NEGATIVE
Ketones, ur: NEGATIVE mg/dL
Leukocytes,Ua: NEGATIVE
Nitrite: NEGATIVE
Protein, ur: NEGATIVE mg/dL
RBC / HPF: NONE SEEN RBC/hpf (ref 0–5)
Specific Gravity, Urine: <1.005 — ABNORMAL LOW (ref 1.005–1.030)
Squamous Epithelial / HPF: NONE SEEN (ref 0–5)
pH: 6.5 (ref 5.0–8.0)

## 2021-07-15 LAB — SARS CORONAVIRUS 2 (TAT 6-24 HRS): SARS Coronavirus 2: NEGATIVE

## 2021-07-15 LAB — PROTIME-INR
INR: 1.1 (ref 0.8–1.2)
Prothrombin Time: 14 seconds (ref 11.4–15.2)

## 2021-07-15 LAB — APTT: aPTT: 30 seconds (ref 24–36)

## 2021-07-15 NOTE — Progress Notes (Signed)
PCP - Eilene Ghazi, NP Cardiologist - pt denies  PPM/ICD - n/a  Chest x-ray - n/a EKG - 07/15/21 in PAT Stress Test - pt denies ECHO - pt denies Cardiac Cath - pt denies  Sleep Study - pt denies  Blood Thinner Instructions: n/a Aspirin Instructions: As of today, STOP taking any Aspirin (unless otherwise instructed by your surgeon) Aleve, Naproxen, Ibuprofen, Motrin, Advil, Goody's, BC's, all herbal medications, fish oil, and all vitamins.  Pt also instructed to stop taking  meloxicam [facility]) as of today.  ERAS Protcol - yes PRE-SURGERY Ensure or G2- ensure  COVID TEST- 07/15/21 in PAT  Pt's initial BP in PAT 181/93. Repeat was 184/102. Pt did forget to take his clonidine and lisinopril this AM. Reports that he had a blood pressure cuff, but it has been broken and is waiting on replacement parts. Pt is asymptomatic. Ebony Hail, PA-C notified about pt's blood pressures, and advised that pt needs to take his medication once he gets home, and to remember to take his clonidine the morning of surgery. Pt verbalized understanding.  Anesthesia review: yes, abnormal EKG  Patient denies shortness of breath, fever, cough and chest pain at PAT appointment   All instructions explained to the patient, with a verbal understanding of the material. Patient agrees to go over the instructions while at home for a better understanding. Patient also instructed to self quarantine after being tested for COVID-19. The opportunity to ask questions was provided.

## 2021-07-16 MED ORDER — TRANEXAMIC ACID 1000 MG/10ML IV SOLN
2000.0000 mg | INTRAVENOUS | Status: DC
  Filled 2021-07-16 (×2): qty 20
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
## 2021-07-17 ENCOUNTER — Telehealth: Payer: Self-pay | Admitting: *Deleted

## 2021-07-17 NOTE — Telephone Encounter (Signed)
Attempted Ortho bundle pre-op call to patient.

## 2021-07-18 ENCOUNTER — Telehealth: Payer: Self-pay | Admitting: *Deleted

## 2021-07-18 DIAGNOSIS — M1712 Unilateral primary osteoarthritis, left knee: Secondary | ICD-10-CM

## 2021-07-18 NOTE — Telephone Encounter (Signed)
Ortho bundle pre-op call completed. 

## 2021-07-18 NOTE — Care Plan (Signed)
[facility] RNCM call to patient to discuss his upcoming Left total knee arthroplasty with Dr. Erlinda Hong. He is an Ortho bundle patient through [facility] and is agreeable to case management. He has a RW, shower chair, crutches, grab bars in the shower, but will need a home CPM. This referral was made through [facility]. Will check on this to ensure this will be delivered to his home or hospital before discharge. He has a friend that will be assisting at his home after discharge. Anticipate need for HHPT as well as OPPT. RNCM will set these up for his new address in [place], [place]. Reviewed all post-op care instructions. Will continue to follow for needs.

## 2021-07-19 ENCOUNTER — Ambulatory Visit (HOSPITAL_COMMUNITY): Payer: Medicare HMO | Admitting: Anesthesiology

## 2021-07-19 ENCOUNTER — Encounter (HOSPITAL_COMMUNITY): Payer: Self-pay | Admitting: Orthopaedic Surgery

## 2021-07-19 ENCOUNTER — Observation Stay (HOSPITAL_COMMUNITY): Payer: Medicare HMO

## 2021-07-19 ENCOUNTER — Encounter (HOSPITAL_COMMUNITY)
Admission: RE | Disposition: A | Discharge status: Home / Self Care | Payer: Self-pay | Source: Home / Self Care | Attending: Orthopaedic Surgery

## 2021-07-19 ENCOUNTER — Other Ambulatory Visit: Payer: Self-pay

## 2021-07-19 ENCOUNTER — Ambulatory Visit (HOSPITAL_COMMUNITY): Payer: Medicare HMO | Admitting: Vascular Surgery

## 2021-07-19 ENCOUNTER — Observation Stay (HOSPITAL_COMMUNITY)
Admission: RE | Admit: 2021-07-19 | Discharge: 2021-07-20 | Disposition: A | Discharge status: Home / Self Care | Payer: Medicare HMO | Attending: Orthopaedic Surgery | Admitting: Orthopaedic Surgery

## 2021-07-19 DIAGNOSIS — Z96641 Presence of right artificial hip joint: Secondary | ICD-10-CM | POA: Diagnosis not present

## 2021-07-19 DIAGNOSIS — E871 Hypo-osmolality and hyponatremia: Secondary | ICD-10-CM | POA: Diagnosis not present

## 2021-07-19 DIAGNOSIS — R52 Pain, unspecified: Secondary | ICD-10-CM

## 2021-07-19 DIAGNOSIS — M1712 Unilateral primary osteoarthritis, left knee: Secondary | ICD-10-CM | POA: Diagnosis not present

## 2021-07-19 DIAGNOSIS — Z96652 Presence of left artificial knee joint: Secondary | ICD-10-CM | POA: Diagnosis not present

## 2021-07-19 DIAGNOSIS — Z7982 Long term (current) use of aspirin: Secondary | ICD-10-CM | POA: Diagnosis not present

## 2021-07-19 DIAGNOSIS — I10 Essential (primary) hypertension: Secondary | ICD-10-CM | POA: Insufficient documentation

## 2021-07-19 DIAGNOSIS — D62 Acute posthemorrhagic anemia: Secondary | ICD-10-CM | POA: Diagnosis not present

## 2021-07-19 DIAGNOSIS — G8918 Other acute postprocedural pain: Secondary | ICD-10-CM | POA: Diagnosis not present

## 2021-07-19 DIAGNOSIS — Z79899 Other long term (current) drug therapy: Secondary | ICD-10-CM | POA: Diagnosis not present

## 2021-07-19 HISTORY — PX: TOTAL KNEE ARTHROPLASTY: SHX125

## 2021-07-19 SURGERY — ARTHROPLASTY, KNEE, TOTAL
Anesthesia: General | Site: Knee | Laterality: Left

## 2021-07-19 MED ORDER — ONDANSETRON HCL 4 MG PO TABS: 4.0000 mg | ORAL_TABLET | Freq: Four times a day (QID) | ORAL | Status: DC | PRN

## 2021-07-19 MED ORDER — CHLORHEXIDINE GLUCONATE 0.12 % MT SOLN
OROMUCOSAL | Status: AC
  Administered 2021-07-19: 15 mL via OROMUCOSAL
  Filled 2021-07-19: qty 15

## 2021-07-19 MED ORDER — FENTANYL CITRATE (PF) 100 MCG/2ML IJ SOLN
INTRAMUSCULAR | Status: AC
  Administered 2021-07-19: 100 ug via INTRAVENOUS
  Filled 2021-07-19: qty 2

## 2021-07-19 MED ORDER — MIDAZOLAM HCL 2 MG/2ML IJ SOLN
INTRAMUSCULAR | Status: AC
  Administered 2021-07-19: 2 mg via INTRAVENOUS
  Filled 2021-07-19: qty 2

## 2021-07-19 MED ORDER — HYDROMORPHONE HCL 1 MG/ML IJ SOLN
INTRAMUSCULAR | Status: AC
  Filled 2021-07-19: qty 1

## 2021-07-19 MED ORDER — SODIUM CHLORIDE 0.9 % IR SOLN
Status: DC | PRN
  Administered 2021-07-19: 3000 mL

## 2021-07-19 MED ORDER — CEFAZOLIN SODIUM-DEXTROSE 2-4 GM/100ML-% IV SOLN
2.0000 g | INTRAVENOUS | Status: AC
  Administered 2021-07-19: 2 g via INTRAVENOUS

## 2021-07-19 MED ORDER — FENTANYL CITRATE (PF) 100 MCG/2ML IJ SOLN
25.0000 ug | INTRAMUSCULAR | Status: DC | PRN
  Administered 2021-07-19 (×2): 50 ug via INTRAVENOUS

## 2021-07-19 MED ORDER — OXYCODONE HCL 5 MG/5ML PO SOLN
5.0000 mg | Freq: Once | ORAL | Status: DC | PRN
Start: 2021-07-19 — End: 2021-07-19

## 2021-07-19 MED ORDER — FENTANYL CITRATE (PF) 250 MCG/5ML IJ SOLN
INTRAMUSCULAR | Status: AC
  Filled 2021-07-19: qty 5

## 2021-07-19 MED ORDER — METOCLOPRAMIDE HCL 5 MG PO TABS: 5.0000 mg | ORAL_TABLET | Freq: Three times a day (TID) | ORAL | Status: DC | PRN

## 2021-07-19 MED ORDER — SODIUM CHLORIDE 0.9 % IV SOLN: INTRAVENOUS | Status: DC

## 2021-07-19 MED ORDER — VANCOMYCIN HCL 1000 MG IV SOLR
INTRAVENOUS | Status: DC | PRN
  Administered 2021-07-19: 1000 mg

## 2021-07-19 MED ORDER — DEXAMETHASONE SODIUM PHOSPHATE 10 MG/ML IJ SOLN
10.0000 mg | Freq: Once | INTRAMUSCULAR | Status: AC
  Administered 2021-07-20: 10 mg via INTRAVENOUS
  Filled 2021-07-19: qty 1

## 2021-07-19 MED ORDER — LACTATED RINGERS IV SOLN: INTRAVENOUS | Status: DC

## 2021-07-19 MED ORDER — METHOCARBAMOL 500 MG PO TABS
500.0000 mg | ORAL_TABLET | Freq: Four times a day (QID) | ORAL | Status: DC | PRN
  Administered 2021-07-20: 500 mg via ORAL
  Filled 2021-07-19: qty 1

## 2021-07-19 MED ORDER — POVIDONE-IODINE 10 % EX SWAB
2.0000 "application " | Freq: Once | CUTANEOUS | Status: AC
  Administered 2021-07-19: 2 via TOPICAL

## 2021-07-19 MED ORDER — ACETAMINOPHEN 160 MG/5ML PO SOLN: 325.0000 mg | ORAL | Status: DC | PRN

## 2021-07-19 MED ORDER — CEFAZOLIN SODIUM-DEXTROSE 2-4 GM/100ML-% IV SOLN
2.0000 g | Freq: Four times a day (QID) | INTRAVENOUS | Status: AC
  Administered 2021-07-19 (×2): 2 g via INTRAVENOUS
  Filled 2021-07-19 (×2): qty 100

## 2021-07-19 MED ORDER — BUPIVACAINE-MELOXICAM ER 400-12 MG/14ML IJ SOLN
INTRAMUSCULAR | Status: DC | PRN
  Administered 2021-07-19: 400 mg

## 2021-07-19 MED ORDER — LACTATED RINGERS IV SOLN
INTRAVENOUS | Status: DC
Start: 2021-07-19 — End: 2021-07-19

## 2021-07-19 MED ORDER — HYDROMORPHONE HCL 1 MG/ML IJ SOLN
0.2500 mg | INTRAMUSCULAR | Status: DC | PRN
  Administered 2021-07-19 (×2): 0.5 mg via INTRAVENOUS

## 2021-07-19 MED ORDER — CHLORHEXIDINE GLUCONATE 0.12 % MT SOLN: 15.0000 mL | Freq: Once | OROMUCOSAL | Status: AC

## 2021-07-19 MED ORDER — BUPIVACAINE-MELOXICAM ER 400-12 MG/14ML IJ SOLN
INTRAMUSCULAR | Status: AC
  Filled 2021-07-19: qty 1

## 2021-07-19 MED ORDER — ACETAMINOPHEN 325 MG PO TABS: 325.0000 mg | ORAL_TABLET | ORAL | Status: DC | PRN

## 2021-07-19 MED ORDER — ONDANSETRON HCL 4 MG/2ML IJ SOLN
INTRAMUSCULAR | Status: DC | PRN
  Administered 2021-07-19: 4 mg via INTRAVENOUS

## 2021-07-19 MED ORDER — VANCOMYCIN HCL 1000 MG IV SOLR
INTRAVENOUS | Status: AC
  Filled 2021-07-19: qty 20

## 2021-07-19 MED ORDER — ASPIRIN 81 MG PO CHEW
81.0000 mg | CHEWABLE_TABLET | Freq: Two times a day (BID) | ORAL | Status: DC
  Administered 2021-07-19 – 2021-07-20 (×2): 81 mg via ORAL
  Filled 2021-07-19 (×2): qty 1

## 2021-07-19 MED ORDER — MEPERIDINE HCL 25 MG/ML IJ SOLN: 6.2500 mg | INTRAMUSCULAR | Status: DC | PRN

## 2021-07-19 MED ORDER — FENTANYL CITRATE (PF) 100 MCG/2ML IJ SOLN: 100.0000 ug | Freq: Once | INTRAMUSCULAR | Status: AC

## 2021-07-19 MED ORDER — ONDANSETRON HCL 4 MG/2ML IJ SOLN: 4.0000 mg | Freq: Once | INTRAMUSCULAR | Status: DC | PRN

## 2021-07-19 MED ORDER — METOCLOPRAMIDE HCL 5 MG/ML IJ SOLN: 5.0000 mg | Freq: Three times a day (TID) | INTRAMUSCULAR | Status: DC | PRN

## 2021-07-19 MED ORDER — PROPOFOL 10 MG/ML IV BOLUS
INTRAVENOUS | Status: DC | PRN
  Administered 2021-07-19: 180 mg via INTRAVENOUS

## 2021-07-19 MED ORDER — ACETAMINOPHEN 325 MG PO TABS: 325.0000 mg | ORAL_TABLET | Freq: Four times a day (QID) | ORAL | Status: DC | PRN

## 2021-07-19 MED ORDER — DOCUSATE SODIUM 100 MG PO CAPS
100.0000 mg | ORAL_CAPSULE | Freq: Two times a day (BID) | ORAL | Status: DC
  Administered 2021-07-19 – 2021-07-20 (×2): 100 mg via ORAL
  Filled 2021-07-19 (×2): qty 1

## 2021-07-19 MED ORDER — HYDROMORPHONE HCL 1 MG/ML IJ SOLN
0.2500 mg | INTRAMUSCULAR | Status: AC | PRN
  Administered 2021-07-19 (×4): 0.5 mg via INTRAVENOUS

## 2021-07-19 MED ORDER — ONDANSETRON HCL 4 MG/2ML IJ SOLN: 4.0000 mg | Freq: Four times a day (QID) | INTRAMUSCULAR | Status: DC | PRN

## 2021-07-19 MED ORDER — TRANEXAMIC ACID-NACL 1000-0.7 MG/100ML-% IV SOLN
1000.0000 mg | INTRAVENOUS | Status: AC
  Administered 2021-07-19: 1000 mg via INTRAVENOUS
  Filled 2021-07-19: qty 100

## 2021-07-19 MED ORDER — 0.9 % SODIUM CHLORIDE (POUR BTL) OPTIME
TOPICAL | Status: DC | PRN
  Administered 2021-07-19: 1000 mL

## 2021-07-19 MED ORDER — OXYCODONE HCL 5 MG PO TABS
5.0000 mg | ORAL_TABLET | Freq: Once | ORAL | Status: DC | PRN
Start: 2021-07-19 — End: 2021-07-19

## 2021-07-19 MED ORDER — TRANEXAMIC ACID 1000 MG/10ML IV SOLN
INTRAVENOUS | Status: DC | PRN
  Administered 2021-07-19: 2000 mg via TOPICAL

## 2021-07-19 MED ORDER — BUPIVACAINE-EPINEPHRINE (PF) 0.5% -1:200000 IJ SOLN
INTRAMUSCULAR | Status: DC | PRN
  Administered 2021-07-19: 20 mL via PERINEURAL

## 2021-07-19 MED ORDER — FENTANYL CITRATE (PF) 100 MCG/2ML IJ SOLN
INTRAMUSCULAR | Status: AC
  Filled 2021-07-19: qty 2

## 2021-07-19 MED ORDER — PHENOL 1.4 % MT LIQD: 1.0000 | OROMUCOSAL | Status: DC | PRN

## 2021-07-19 MED ORDER — CEFAZOLIN SODIUM-DEXTROSE 2-4 GM/100ML-% IV SOLN
INTRAVENOUS | Status: AC
  Filled 2021-07-19: qty 100

## 2021-07-19 MED ORDER — TRANEXAMIC ACID-NACL 1000-0.7 MG/100ML-% IV SOLN: 1000.0000 mg | Freq: Once | INTRAVENOUS | Status: DC

## 2021-07-19 MED ORDER — FENTANYL CITRATE (PF) 100 MCG/2ML IJ SOLN
INTRAMUSCULAR | Status: DC | PRN
  Administered 2021-07-19: 50 ug via INTRAVENOUS
  Administered 2021-07-19: 100 ug via INTRAVENOUS
  Administered 2021-07-19 (×2): 50 ug via INTRAVENOUS

## 2021-07-19 MED ORDER — ORAL CARE MOUTH RINSE: 15.0000 mL | Freq: Once | OROMUCOSAL | Status: AC

## 2021-07-19 MED ORDER — HYDROMORPHONE HCL 1 MG/ML IJ SOLN: 0.5000 mg | INTRAMUSCULAR | Status: DC | PRN

## 2021-07-19 MED ORDER — MIDAZOLAM HCL 5 MG/5ML IJ SOLN
INTRAMUSCULAR | Status: DC | PRN
  Administered 2021-07-19: 2 mg via INTRAVENOUS

## 2021-07-19 MED ORDER — MIDAZOLAM HCL 2 MG/2ML IJ SOLN: 2.0000 mg | Freq: Once | INTRAMUSCULAR | Status: AC

## 2021-07-19 MED ORDER — IRRISEPT - 450ML BOTTLE WITH 0.05% CHG IN STERILE WATER, USP 99.95% OPTIME
TOPICAL | Status: DC | PRN
  Administered 2021-07-19: 450 mL via TOPICAL

## 2021-07-19 MED ORDER — MIDAZOLAM HCL 2 MG/2ML IJ SOLN
INTRAMUSCULAR | Status: AC
  Filled 2021-07-19: qty 2

## 2021-07-19 MED ORDER — METHOCARBAMOL 1000 MG/10ML IJ SOLN
500.0000 mg | Freq: Four times a day (QID) | INTRAVENOUS | Status: DC | PRN
  Filled 2021-07-19: qty 5

## 2021-07-19 MED ORDER — MENTHOL 3 MG MT LOZG: 1.0000 | LOZENGE | OROMUCOSAL | Status: DC | PRN

## 2021-07-19 MED ORDER — OXYCODONE HCL 5 MG PO TABS
5.0000 mg | ORAL_TABLET | ORAL | Status: DC | PRN
  Administered 2021-07-19 – 2021-07-20 (×2): 5 mg via ORAL
  Administered 2021-07-20: 10 mg via ORAL
  Administered 2021-07-20 (×2): 5 mg via ORAL
  Filled 2021-07-19 (×3): qty 1
  Filled 2021-07-19: qty 2
  Filled 2021-07-19: qty 1

## 2021-07-19 MED ORDER — OXYCODONE HCL 5 MG PO TABS
10.0000 mg | ORAL_TABLET | ORAL | Status: DC | PRN
  Administered 2021-07-19: 10 mg via ORAL
  Filled 2021-07-19: qty 2

## 2021-07-19 MED ORDER — ACETAMINOPHEN 500 MG PO TABS
1000.0000 mg | ORAL_TABLET | Freq: Four times a day (QID) | ORAL | Status: AC
  Administered 2021-07-19 – 2021-07-20 (×3): 1000 mg via ORAL
  Filled 2021-07-19 (×4): qty 2

## 2021-07-19 SURGICAL SUPPLY — 82 items
ADH SKN CLS APL DERMABOND .7 (GAUZE/BANDAGES/DRESSINGS) ×1
ALCOHOL 70% 16 OZ (MISCELLANEOUS) ×2 IMPLANT
BAG COUNTER SPONGE SURGICOUNT (BAG) IMPLANT
BAG DECANTER FOR FLEXI CONT (MISCELLANEOUS) ×2 IMPLANT
BAG SPNG CNTER NS LX DISP (BAG)
BANDAGE ESMARK 6X9 LF (GAUZE/BANDAGES/DRESSINGS) IMPLANT
BLADE SAG 18X100X1.27 (BLADE) ×2 IMPLANT
BNDG CMPR 9X6 STRL LF SNTH (GAUZE/BANDAGES/DRESSINGS)
BNDG ESMARK 6X9 LF (GAUZE/BANDAGES/DRESSINGS)
BOWL SMART MIX CTS (DISPOSABLE) ×2 IMPLANT
BSPLAT TIB 5D F CMNT KN LT (Knees) ×1 IMPLANT
CEMENT BONE REFOBACIN R1X40 US (Cement) ×2 IMPLANT
CLSR STERI-STRIP ANTIMIC 1/2X4 (GAUZE/BANDAGES/DRESSINGS) ×4 IMPLANT
COOLER ICEMAN CLASSIC (MISCELLANEOUS) ×2 IMPLANT
COVER SURGICAL LIGHT HANDLE (MISCELLANEOUS) ×2 IMPLANT
CUFF TOURN SGL QUICK 34 (TOURNIQUET CUFF) ×2
CUFF TOURN SGL QUICK 42 (TOURNIQUET CUFF) IMPLANT
CUFF TRNQT CYL 34X4.125X (TOURNIQUET CUFF) ×1 IMPLANT
DERMABOND ADVANCED (GAUZE/BANDAGES/DRESSINGS) ×1
DERMABOND ADVANCED .7 DNX12 (GAUZE/BANDAGES/DRESSINGS) ×1 IMPLANT
DRAPE EXTREMITY T 121X128X90 (DISPOSABLE) ×2 IMPLANT
DRAPE HALF SHEET 40X57 (DRAPES) ×2 IMPLANT
DRAPE INCISE IOBAN 66X45 STRL (DRAPES) IMPLANT
DRAPE ORTHO SPLIT 77X108 STRL (DRAPES) ×4
DRAPE POUCH INSTRU U-SHP 10X18 (DRAPES) ×2 IMPLANT
DRAPE SURG ORHT 6 SPLT 77X108 (DRAPES) ×2 IMPLANT
DRAPE U-SHAPE 47X51 STRL (DRAPES) ×4 IMPLANT
DRSG AQUACEL AG ADV 3.5X10 (GAUZE/BANDAGES/DRESSINGS) ×2 IMPLANT
DURAPREP 26ML APPLICATOR (WOUND CARE) ×6 IMPLANT
ELECT CAUTERY BLADE 6.4 (BLADE) ×2 IMPLANT
ELECT REM PT RETURN 9FT ADLT (ELECTROSURGICAL) ×2
ELECTRODE REM PT RTRN 9FT ADLT (ELECTROSURGICAL) ×1 IMPLANT
FEMORAL KNEE COMP SZ 8 STND LT (Knees) ×2 IMPLANT
FEMORAL KNEE COMP SZ 8STD LT (Knees) IMPLANT
GLOVE SURG NEOP MICRO LF SZ7.5 (GLOVE) ×6 IMPLANT
GLOVE SURG SYN 7.5  E (GLOVE) ×8
GLOVE SURG SYN 7.5 E (GLOVE) ×4 IMPLANT
GLOVE SURG SYN 7.5 PF PI (GLOVE) ×4 IMPLANT
GLOVE SURG UNDER LTX SZ7.5 (GLOVE) ×6 IMPLANT
GLOVE SURG UNDER POLY LF SZ7 (GLOVE) ×10 IMPLANT
GOWN STRL REIN XL XLG (GOWN DISPOSABLE) ×2 IMPLANT
GOWN STRL REUS W/ TWL LRG LVL3 (GOWN DISPOSABLE) ×1 IMPLANT
GOWN STRL REUS W/TWL LRG LVL3 (GOWN DISPOSABLE) ×2
HANDPIECE INTERPULSE COAX TIP (DISPOSABLE) ×2
HDLS TROCR DRIL PIN KNEE 75 (PIN) ×2
HOOD PEEL AWAY FLYTE STAYCOOL (MISCELLANEOUS) ×4 IMPLANT
INSERT ASF POLY 14 LT (Insert) ×1 IMPLANT
JET LAVAGE IRRISEPT WOUND (IRRIGATION / IRRIGATOR) ×2
KIT BASIN OR (CUSTOM PROCEDURE TRAY) ×2 IMPLANT
KIT TURNOVER KIT B (KITS) ×2 IMPLANT
LAVAGE JET IRRISEPT WOUND (IRRIGATION / IRRIGATOR) ×1 IMPLANT
MANIFOLD NEPTUNE II (INSTRUMENTS) ×2 IMPLANT
MARKER SKIN DUAL TIP RULER LAB (MISCELLANEOUS) ×2 IMPLANT
NDL SPNL 18GX3.5 QUINCKE PK (NEEDLE) ×1 IMPLANT
NEEDLE SPNL 18GX3.5 QUINCKE PK (NEEDLE) ×2 IMPLANT
NS IRRIG 1000ML POUR BTL (IV SOLUTION) ×2 IMPLANT
PACK TOTAL JOINT (CUSTOM PROCEDURE TRAY) ×2 IMPLANT
PAD ARMBOARD 7.5X6 YLW CONV (MISCELLANEOUS) ×4 IMPLANT
PAD COLD SHLDR WRAP-ON (PAD) ×2 IMPLANT
PIN DRILL HDLS TROCAR 75 4PK (PIN) IMPLANT
SAW OSC TIP CART 19.5X105X1.3 (SAW) ×2 IMPLANT
SCREW FEMALE HEX FIX 25X2.5 (ORTHOPEDIC DISPOSABLE SUPPLIES) ×1 IMPLANT
SET HNDPC FAN SPRY TIP SCT (DISPOSABLE) ×1 IMPLANT
STAPLER VISISTAT 35W (STAPLE) IMPLANT
STEM POLY PAT PLY 35M KNEE (Knees) ×1 IMPLANT
STEM TIBIA 5 DEG SZ F L KNEE (Knees) IMPLANT
SUCTION FRAZIER HANDLE 10FR (MISCELLANEOUS) ×2
SUCTION TUBE FRAZIER 10FR DISP (MISCELLANEOUS) ×1 IMPLANT
SUT ETHILON 2 0 FS 18 (SUTURE) IMPLANT
SUT MNCRL AB 4-0 PS2 18 (SUTURE) IMPLANT
SUT VIC AB 0 CT1 27 (SUTURE) ×4
SUT VIC AB 0 CT1 27XBRD ANBCTR (SUTURE) ×2 IMPLANT
SUT VIC AB 1 CTX 27 (SUTURE) ×6 IMPLANT
SUT VIC AB 2-0 CT1 27 (SUTURE) ×8
SUT VIC AB 2-0 CT1 TAPERPNT 27 (SUTURE) ×4 IMPLANT
SYR 50ML LL SCALE MARK (SYRINGE) ×4 IMPLANT
TIBIA STEM 5 DEG SZ F L KNEE (Knees) ×2 IMPLANT
TOWEL GREEN STERILE (TOWEL DISPOSABLE) ×2 IMPLANT
TOWEL GREEN STERILE FF (TOWEL DISPOSABLE) ×2 IMPLANT
TRAY CATH 16FR W/PLASTIC CATH (SET/KITS/TRAYS/PACK) IMPLANT
UNDERPAD 30X36 HEAVY ABSORB (UNDERPADS AND DIAPERS) ×2 IMPLANT
YANKAUER SUCT BULB TIP NO VENT (SUCTIONS) ×2 IMPLANT

## 2021-07-19 NOTE — Anesthesia Procedure Notes (Signed)
Anesthesia Regional Block: Adductor canal block   Pre-Anesthetic Checklist: , timeout performed,  Correct Patient, Correct Site, Correct Laterality,  Correct Procedure, Correct Position, site marked,  Risks and benefits discussed,  Surgical consent,  Pre-op evaluation,  At surgeon's request and post-op pain management  Laterality: Left  Prep: chloraprep       Needles:  Injection technique: Single-shot  Needle Type: Echogenic Stimulator Needle     Needle Length: 5cm  Needle Gauge: 22     Additional Needles:   Procedures:, nerve stimulator,,, ultrasound used (permanent image in chart),,    Narrative:  Start time: 07/19/2021 11:30 AM End time: 07/19/2021 11:36 AM Injection made incrementally with aspirations every 5 mL.  Performed by: Personally  Anesthesiologist: Janeece Riggers, MD  Additional Notes: Functioning IV was confirmed and monitors were applied.  A 79m 22ga Arrow echogenic stimulator needle was used. Sterile prep and drape,hand hygiene and sterile gloves were used. Ultrasound guidance: relevant anatomy identified, needle position confirmed, local anesthetic spread visualized around nerve(s)., vascular puncture avoided.  Image printed for medical record. Negative aspiration and negative test dose prior to incremental administration of local anesthetic. The patient tolerated the procedure well.
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
## 2021-07-19 NOTE — Discharge Instructions (Signed)

## 2021-07-19 NOTE — Anesthesia Postprocedure Evaluation (Signed)
Anesthesia Post Note  Patient: Joshua Burton  Procedure(s) Performed: LEFT TOTAL KNEE ARTHROPLASTY (Left: Knee)     Patient location during evaluation: PACU Anesthesia Type: General Level of consciousness: awake and alert Pain management: pain level controlled Vital Signs Assessment: post-procedure vital signs reviewed and stable Respiratory status: spontaneous breathing, nonlabored ventilation, respiratory function stable and patient connected to nasal cannula oxygen Cardiovascular status: blood pressure returned to baseline and stable Postop Assessment: no apparent nausea or vomiting Anesthetic complications: no   No notable events documented.  Last Vitals:  Vitals:   07/19/21 1617 07/19/21 1924  BP: (!) 161/95 (!) 142/87  Pulse: 80 87  Resp: 14 16  Temp: 36.6 C (!) 36.4 C  SpO2: 100% 96%    Last Pain:  Vitals:   07/19/21 1924  TempSrc: Oral  PainSc: 0-No pain                 Danica Camarena

## 2021-07-19 NOTE — Transfer of Care (Signed)
Immediate Anesthesia Transfer of Care Note  Patient: Joshua Burton  Procedure(s) Performed: LEFT TOTAL KNEE ARTHROPLASTY (Left: Knee)  Patient Location: PACU  Anesthesia Type:General  Level of Consciousness: awake, alert , oriented and patient cooperative  Airway & Oxygen Therapy: Patient Spontanous Breathing  Post-op Assessment: Report given to RN, Post -op Vital signs reviewed and stable and Patient moving all extremities X 4  Post vital signs: Reviewed and stable  Last Vitals:  Vitals Value Taken Time  BP 115/73 07/19/21 1441  Temp    Pulse 84 07/19/21 1448  Resp 6 07/19/21 1448  SpO2 88 % 07/19/21 1448  Vitals shown include unvalidated device data.  Last Pain:  Vitals:   07/19/21 1140  TempSrc:   PainSc: 0-No pain      Patients Stated Pain Goal: 3 ([id number])  Complications: No notable events documented.

## 2021-07-19 NOTE — Anesthesia Procedure Notes (Signed)
Procedure Name: LMA Insertion Date/Time: 07/19/2021 12:33 PM Performed by: Jonna Munro, CRNA Pre-anesthesia Checklist: Patient identified, Emergency Drugs available, Suction available, Patient being monitored and Timeout performed Patient Re-evaluated:Patient Re-evaluated prior to induction Oxygen Delivery Method: Circle system utilized Preoxygenation: Pre-oxygenation with 100% oxygen Induction Type: IV induction LMA: LMA inserted LMA Size: 4.0 Number of attempts: 1 Placement Confirmation: positive ETCO2 and breath sounds checked- equal and bilateral Tube secured with: Tape Dental Injury: Teeth and Oropharynx as per pre-operative assessment

## 2021-07-19 NOTE — Op Note (Signed)
Total Knee Arthroplasty Procedure Note  Preoperative diagnosis: Left knee osteoarthritis  Postoperative diagnosis:same  Operative procedure: Left total knee arthroplasty. CPT [id number]  Surgeon: N. Eduard Roux, MD  Assist: Madalyn Rob, PA-C; necessary for the timely completion of procedure and due to complexity of procedure.  Anesthesia: general LMA, regional, local  Tourniquet time: see anesthesia record  Implants used: Zimmer persona Femur: CR 8 Tibia: F Patella: 35 mm Polyethylene: 14 mm, MC  Indication: Joshua Burton is a 70 y.o. year old male with a history of knee pain. Having failed conservative management, the patient elected to proceed with a total knee arthroplasty.  We have reviewed the risk and benefits of the surgery and they elected to proceed after voicing understanding.  Procedure:  After informed consent was obtained and understanding of the risk were voiced including but not limited to bleeding, infection, damage to surrounding structures including nerves and vessels, blood clots, leg length inequality and the failure to achieve desired results, the operative extremity was marked with verbal confirmation of the patient in the holding area.   The patient was then brought to the operating room and transported to the operating room table in the supine position.  A tourniquet was applied to the operative extremity around the upper thigh. The operative limb was then prepped and draped in the usual sterile fashion and preoperative antibiotics were administered.  A time out was performed prior to the start of surgery confirming the correct extremity, preoperative antibiotic administration, as well as team members, implants and instruments available for the case. Correct surgical site was also confirmed with preoperative radiographs. The limb was then elevated for exsanguination and the tourniquet was inflated. A midline incision was made and a standard medial  parapatellar approach was performed.  There was a large joint effusion.  Severe synovitis throughout the knee.  Synovectomy was performed.  The infrapatellar fat pad was removed.  Suprapatellar synovium was removed to reveal the anterior distal femoral cortex.  A medial peel was performed to release the capsule of the medial tibial plateau.  The patella was then everted and was prepared and sized to a 35 mm.  A cover was placed on the patella for protection from retractors.  The knee was then brought into full flexion and we then turned our attention to the femur.  The cruciates were sacrificed.  Start site was drilled in the femur and the intramedullary distal femoral cutting guide was placed, set at 3 degrees valgus, taking 12 mm of distal resection. The distal cut was made. Osteophytes were then removed.  Next, the proximal tibial cutting guide was placed with appropriate slope, varus/valgus alignment and depth of resection. The proximal tibial cut was made. Gap blocks were then used to assess the extension gap and alignment, and appropriate soft tissue releases were performed. Attention was turned back to the femur, which was sized using the sizing guide to a size 8. Appropriate rotation of the femoral component was determined using epicondylar axis, Whiteside's line, and assessing the flexion gap under ligament tension. The appropriate size 4-in-1 cutting block was placed and checked with an angel wing and cuts were made. Posterior femoral osteophytes and uncapped bone were then removed with the curved osteotome.  Trial components were placed, and stability was checked in full extension, mid-flexion, and deep flexion. Proper tibial rotation was determined and marked.  The patella tracked well without a lateral release.  The femoral lugs were then drilled. Trial components were then removed  and tibial preparation performed.  The tibia was sized for a size F component.   The bony surfaces were irrigated with a  pulse lavage and then dried. Bone cement was vacuum mixed on the back table, and the final components sized above were cemented into place.  Antibiotic irrigation was placed in the knee joint and soft tissues while the cement cured.  After cement had finished curing, excess cement was removed. The stability of the construct was re-evaluated throughout a range of motion and found to be acceptable. The trial liner was removed, the knee was copiously irrigated, and the knee was re-evaluated for any excess bone debris. The real polyethylene liner, 14 mm thick, was inserted and checked to ensure the locking mechanism had engaged appropriately. The tourniquet was deflated and hemostasis was achieved. The wound was irrigated with normal saline.  One gram of vancomycin powder was placed in the surgical bed.  Capsular closure was performed with a #1 vicryl, subcutaneous fat closed with a 0 vicryl suture, then subcutaneous tissue closed with interrupted 2.0 vicryl suture. The skin was then closed with a 2.0 nylon and dermabond. A sterile dressing was applied.  The patient was awakened in the operating room and taken to recovery in stable condition. All sponge, needle, and instrument counts were correct at the end of the case.  [name] was necessary for opening, closing, retracting, limb positioning and overall facilitation and completion of the surgery.  Position: supine  Complications: none.  Time Out: performed   Drains/Packing: none  Estimated blood loss: minimal  Returned to Recovery Room: in good condition.   Antibiotics: yes   Mechanical VTE (DVT) Prophylaxis: sequential compression devices, TED thigh-high  Chemical VTE (DVT) Prophylaxis: aspirin  Fluid Replacement  Crystalloid: see anesthesia record Blood: none  FFP: none   Specimens Removed: 1 to pathology   Sponge and Instrument Count Correct? yes   PACU: portable radiograph - knee AP and Lateral   Plan/RTC: Return in 2 weeks  for wound check.   Weight Bearing/Load Lower Extremity: full   Implant Name Type Inv. Item Serial No. Manufacturer Lot No. LRB No. Used Action  CEMENT BONE REFOBACIN [id number] [place] - [id number] Cement CEMENT BONE REFOBACIN [id number] [place]  ZIMMER RECON(ORTH,TRAU,BIO,SG) [id number] Left 1 Implanted  CEMENT BONE REFOBACIN [id number] [place] - [id number] Cement CEMENT BONE REFOBACIN [id number] [place]  ZIMMER RECON(ORTH,TRAU,BIO,SG) [id number] Left 1 Implanted  STEM POLY PAT PLY 83M KNEE - [id number] Knees STEM POLY PAT PLY 83M KNEE  ZIMMER RECON(ORTH,TRAU,BIO,SG) [id number] Left 1 Implanted  FEMORAL KNEE COMP SZ 8 STND LT - [id number] Knees FEMORAL KNEE COMP SZ 8 STND LT  ZIMMER RECON(ORTH,TRAU,BIO,SG) [id number] Left 1 Implanted  TIBIA STEM 5 DEG SZ F L KNEE - [id number] Knees TIBIA STEM 5 DEG SZ F L KNEE  ZIMMER RECON(ORTH,TRAU,BIO,SG) [id number] Left 1 Implanted  Polyethylene Articular Surface Left 14 mm Height    Zimmer [id number] Left 1 Implanted    N. Eduard Roux, MD [facility] 2:07 PM

## 2021-07-20 ENCOUNTER — Encounter (HOSPITAL_COMMUNITY): Payer: Self-pay | Admitting: Orthopaedic Surgery

## 2021-07-20 DIAGNOSIS — I10 Essential (primary) hypertension: Secondary | ICD-10-CM | POA: Diagnosis not present

## 2021-07-20 DIAGNOSIS — Z79899 Other long term (current) drug therapy: Secondary | ICD-10-CM | POA: Diagnosis not present

## 2021-07-20 DIAGNOSIS — M1712 Unilateral primary osteoarthritis, left knee: Secondary | ICD-10-CM | POA: Diagnosis not present

## 2021-07-20 DIAGNOSIS — Z96641 Presence of right artificial hip joint: Secondary | ICD-10-CM | POA: Diagnosis not present

## 2021-07-20 DIAGNOSIS — Z7982 Long term (current) use of aspirin: Secondary | ICD-10-CM | POA: Diagnosis not present

## 2021-07-20 DIAGNOSIS — Z96652 Presence of left artificial knee joint: Secondary | ICD-10-CM | POA: Diagnosis not present

## 2021-07-20 LAB — BASIC METABOLIC PANEL
Anion gap: 7 (ref 5–15)
BUN: 12 mg/dL (ref 8–23)
CO2: 25 mmol/L (ref 22–32)
Calcium: 8.7 mg/dL — ABNORMAL LOW (ref 8.9–10.3)
Chloride: 100 mmol/L (ref 98–111)
Creatinine, Ser: 0.74 mg/dL (ref 0.61–1.24)
GFR, Estimated: >60 mL/min (ref >60)
Glucose, Bld: 119 mg/dL — ABNORMAL HIGH (ref 70–99)
Potassium: 4.3 mmol/L (ref 3.5–5.1)
Sodium: 132 mmol/L — ABNORMAL LOW (ref 135–145)

## 2021-07-20 LAB — CBC
HCT: 34.7 % — ABNORMAL LOW (ref 39.0–52.0)
Hemoglobin: 11.9 g/dL — ABNORMAL LOW (ref 13.0–17.0)
MCH: 32.2 pg (ref 26.0–34.0)
MCHC: 34.3 g/dL (ref 30.0–36.0)
MCV: 93.8 fL (ref 80.0–100.0)
Platelets: 126 10*3/uL — ABNORMAL LOW (ref 150–400)
RBC: 3.7 MIL/uL — ABNORMAL LOW (ref 4.22–5.81)
RDW: 13.5 % (ref 11.5–15.5)
WBC: 8.6 10*3/uL (ref 4.0–10.5)
nRBC: 0 % (ref 0.0–0.2)

## 2021-07-20 NOTE — TOC Transition Note (Signed)
Transition of Care [facility]) - CM/SW Discharge Note   Patient Details  Name: Joshua Burton MRN: [id number] Date of Birth: 1951/02/19  Transition of Care [facility]) CM/SW Contact:  Sharin Mons, RN Phone Number: 07/20/2021, 10:14 AM   Clinical Narrative:    Patient will DC to: home  Anticipated DC date: 07/20/2921 Family notified: yes,wife Transport by: care           S/p Left total knee arthroplasty Per MD patient ready for DC today. RN, patient, and patient's wife, notified of DC. [place] to provide home health services, [facility] within 48 hrs post discharge. CPM will be delivered to beside prior to d/c.by Ovid Curd [facility] [phone].  Pt without  Rx med concerns.  Wife to provide transportation to home.   Post hospital f/u noted on AVS.  RNCM will sign off for now as intervention is no longer needed. Please consult [place] again if new needs arise.    Final next level of care: [place] Barriers to Discharge: No Barriers Identified   Patient Goals and CMS Choice     Choice offered to / list presented to : Patient  Discharge Placement                       Discharge Plan and Services                DME Arranged: CPM DME Agency: [employer] Date DME Agency Contacted: 07/20/21 Time DME Agency Contacted: [phone] Representative spoke with at DME Agency: Ovid Curd HH Arranged: PT [place]: [place] Date [place]: 07/19/21 Time [place]: 1918 Representative spoke with at [place]: [place] ([place]) Interventions     Readmission Risk Interventions No flowsheet data found.

## 2021-07-20 NOTE — Progress Notes (Signed)
Subjective: 1 Day Post-Op Procedure(s) (LRB): LEFT TOTAL KNEE ARTHROPLASTY (Left) Patient reports pain as mild.    Objective: Vital signs in last 24 hours: Temp:  [97.5 F (36.4 C)-98 F (36.7 C)] 98 F (36.7 C) (09/13 0700) Pulse Rate:  [69-87] 78 (09/13 0700) Resp:  [9-22] 18 (09/13 0700) BP: (108-174)/(59-98) 174/79 (09/13 0700) SpO2:  [94 %-100 %] 96 % (09/12 1924) Weight:  [93.7 kg] 93.7 kg (09/12 1117)  Intake/Output from previous day: 09/12 0701 - 09/13 0700 In: 1732.5 [P.O.:240; I.V.:1203.8; IV Piggyback:288.7] Out: 1200 [Urine:1150; Blood:50] Intake/Output this shift: No intake/output data recorded.  Recent Labs    07/20/21 0217  HGB 11.9*   Recent Labs    07/20/21 0217  WBC 8.6  RBC 3.70*  HCT 34.7*  PLT 126*   Recent Labs    07/20/21 0217  NA 132*  K 4.3  CL 100  CO2 25  BUN 12  CREATININE 0.74  GLUCOSE 119*  CALCIUM 8.7*   No results for input(s): LABPT, INR in the last 72 hours.  Neurologically intact Neurovascular intact Sensation intact distally Intact pulses distally Dorsiflexion/Plantar flexion intact Incision: moderate drainage No cellulitis present Compartment soft   Assessment/Plan: 1 Day Post-Op Procedure(s) (LRB): LEFT TOTAL KNEE ARTHROPLASTY (Left) Advance diet Up with therapy D/C IV fluids Discharge home with home health after second PT session WBAT LLE ABLA- mild and stable Aquacel dressing changed by me today.  Nurse to call me if more drainage to dressing.   Anticipated LOS equal to or greater than 2 midnights due to - Age 69 and older with one or more of the following:  - Obesity  - Expected need for hospital services (PT, OT, Nursing) required for safe  discharge  - Anticipated need for postoperative skilled nursing care or inpatient rehab  - Active co-morbidities: None OR   - Unanticipated findings during/Post Surgery: None  - Patient is a high risk of re-admission due to: None   Aundra Dubin 07/20/2021, 7:59 AM

## 2021-07-20 NOTE — Progress Notes (Signed)
Discharge instructions addressed; Pt in stable condition; Pt.'s wife will be his ride waiting at the [employer] entrance.

## 2021-07-20 NOTE — Evaluation (Signed)
Occupational Therapy Evaluation Patient Details Name: Joshua Burton MRN: [id number] DOB: 1951/09/27 Today's Date: 07/20/2021   History of Present Illness Pt adm 9/12 for L TKR. PMH significant for HTN, etoh cirrhosis, hep C, esophageal varices, rt THR.   Clinical Impression   PTA, pt was Independent and living at home with his spouse. Pt currently Mod I for ADLs and functional mobility with RW; exception of supervision for tub transfer. Provided handout on compensatory techniques for tub transfer. Pt with good understanding of techniques and home support, therefore, recommending home with intermittent supervision. All OT needs met in the acute setting.     Recommendations for follow up therapy are one component of a multi-disciplinary discharge planning process, led by the attending physician.  Recommendations may be updated based on patient status, additional functional criteria and insurance authorization.   Follow Up Recommendations  No OT follow up;Supervision - Intermittent    Equipment Recommendations  None recommended by OT    Recommendations for Other Services       Precautions / Restrictions Precautions Precautions: Knee Restrictions Weight Bearing Restrictions: Yes LLE Weight Bearing: Weight bearing as tolerated      Mobility Bed Mobility               General bed mobility comments: Pt sitting in recliner upon arrival    Transfers Overall transfer level: Modified independent Equipment used: Rolling walker (2 wheeled) Transfers: Sit to/from Stand Sit to Stand: Modified independent (Device/Increase time)         General transfer comment: Pt required Mod I for all transfers except tub shower (required supervision for safety and verbal cues)    Balance Overall balance assessment: No apparent balance deficits (not formally assessed)                                         ADL either performed or assessed with clinical judgement   ADL  Overall ADL's : Modified independent                                       General ADL Comments: Mod I for all self care tasks, with the exception of tub shower transfer (supervision). Pt required min verbal cues to demonstrate correct/safe tub transfer techniques. Pt also educated on safe/modified dressing technique, demonstrated understanding of education.     Vision Baseline Vision/History: 0 No visual deficits Ability to See in Adequate Light: 0 Adequate Patient Visual Report: No change from baseline Vision Assessment?: No apparent visual deficits Additional Comments: Pt reported past eye surgeries and requires sunglasses in sunlight. Reports no other present vision problems.     Perception     Praxis      Pertinent Vitals/Pain Pain Assessment: No/denies pain     Hand Dominance     Extremity/Trunk Assessment Upper Extremity Assessment Upper Extremity Assessment: Overall WFL for tasks assessed   Lower Extremity Assessment Lower Extremity Assessment: Defer to PT evaluation   Cervical / Trunk Assessment Cervical / Trunk Assessment: Normal   Communication Communication Communication: No difficulties   Cognition Arousal/Alertness: Awake/alert Behavior During Therapy: WFL for tasks assessed/performed Overall Cognitive Status: Within Functional Limits for tasks assessed  General Comments: Pt able to follow multistep commands and maintained converation/divided attention with functional tasks   General Comments       Exercises     Shoulder Instructions      Home Living Family/patient expects to be discharged to:: Private residence Living Arrangements: Spouse/significant other Available Help at Discharge: Family;Available 24 hours/day Type of Home: House Home Access: Stairs to enter [employer] of Steps: 1   Home Layout: One level     Bathroom Shower/Tub: Engineer, site: Handicapped height     Home Equipment: Environmental consultant - 2 wheels;Crutches;Shower seat;Grab bars - tub/shower;Hand held shower head          Prior Functioning/Environment Level of Independence: Independent        Comments: Pt recently retired, was driving, and enjoys riding his motorcycle        OT Problem List: Decreased strength;Decreased range of motion      OT Treatment/Interventions:      OT Goals(Current goals can be found in the care plan section) Acute Rehab OT Goals Patient Stated Goal: return to riding motorcycle OT Goal Formulation: With patient Time For Goal Achievement: 07/20/21 Potential to Achieve Goals: Good  OT Frequency:     Barriers to D/C:            Co-evaluation              AM-PAC OT "6 Clicks" Daily Activity     Outcome Measure Help from another person eating meals?: None Help from another person taking care of personal grooming?: None Help from another person toileting, which includes using toliet, bedpan, or urinal?: None Help from another person bathing (including washing, rinsing, drying)?: A Little Help from another person to put on and taking off regular upper body clothing?: None Help from another person to put on and taking off regular lower body clothing?: None 6 Click Score: 23   End of Session Equipment Utilized During Treatment: Gait belt;Rolling walker (Ice application reapplied after session) Nurse Communication: Mobility status  Activity Tolerance: Patient tolerated treatment well Patient left: with call bell/phone within reach;in chair  OT Visit Diagnosis: Muscle weakness (generalized) (M62.81)                Time: 0737-1062 OT Time Calculation (min): 22 min Charges:  OT General Charges $OT Visit: 1 Visit OT Evaluation $OT Eval Low Complexity: 1 Low  Jackquline Denmark, OTS Acute Rehab Office: [phone]   Deondria Puryear 07/20/2021, 10:06 AM

## 2021-07-20 NOTE — Care Plan (Signed)
Ortho Bundle Case Management Note  Patient Details  Name: Joshua Burton MRN: [id number] Date of Birth: 03/23/1951  [facility] RNCM call to patient to discuss his upcoming Left total knee arthroplasty with Dr. Erlinda Hong. He is an Ortho bundle patient through [facility] and is agreeable to case management. He has a RW, shower chair, crutches, grab bars in the shower, but will need a home CPM. This referral was made through [facility]. [employer] will deliver CPM to hospital prior to discharge. He has a friend that will be assisting at his home after discharge. Anticipate need for HHPT as well as OPPT. RNCM will set these up for his new address in [place], [place]. Reviewed all post-op care instructions. Will continue to follow for needs.                  DME Arranged:  CPM (Patient states he has all needed DME except for CPM-which has been ordered through [place]) DME Agency:  [facility]  HH Arranged:  PT [place] Agency:  [place]  Additional Comments: Please contact me with any questions of if this plan should need to change.  Jamse Arn, RN, BSN, [employer]  [phone] 07/20/2021, 8:35 AM

## 2021-07-20 NOTE — Progress Notes (Signed)
Physical Therapy Treatment Patient Details Name: Joshua Burton MRN: [id number] DOB: [birth year] Today's Date: 07/20/2021   History of Present Illness Pt adm 9/12 for L TKR. PMH significant for HTN, etoh cirrhosis, hep C, esophageal varices, rt THR.    PT Comments    Continuing work on functional mobility and activity tolerance;  Overall moving very well, and independent with bed mobility, transfers and amb with RW; stair training done; Discussed car transfers; OK for dc home from PT standpoint    Recommendations for follow up therapy are one component of a multi-disciplinary discharge planning process, led by the attending physician.  Recommendations may be updated based on patient status, additional functional criteria and insurance authorization.  Follow Up Recommendations  Follow surgeon's recommendation for DC plan and follow-up therapies     Equipment Recommendations  None recommended by PT    Recommendations for Other Services       Precautions / Restrictions Precautions Precautions: Knee Precaution Booklet Issued: Yes (comment) Precaution Comments: Pt educated to not allow any pillow or bolster under knee for healing with optimal range of motion.  Restrictions LLE Weight Bearing: Weight bearing as tolerated     Mobility  Bed Mobility Overal bed mobility: Modified Independent             General bed mobility comments: no difficulty getting to EOB    Transfers Overall transfer level: Modified independent Equipment used: Rolling walker (2 wheeled) Transfers: Sit to/from Stand Sit to Stand: Modified independent (Device/Increase time)            Ambulation/Gait Ambulation/Gait assistance: Supervision Gait Distance (Feet): 200 Feet Assistive device: Rolling walker (2 wheeled) Gait Pattern/deviations: Step-through pattern (emerging)     General Gait Details: Cues to self-monitor for activity tolerance; Nice, stable knee in stance   Stairs Stairs:  Yes Stairs assistance: Supervision Stair Management: No rails;Forwards;Backwards;With walker Number of Stairs: 1 (x2) General stair comments: Cues for sequence; practiced going up one step both forwards and backwards; no difficulty   Wheelchair Mobility    Modified Rankin (Stroke Patients Only)       Balance Overall balance assessment: No apparent balance deficits (not formally assessed)                                          Cognition Arousal/Alertness: Awake/alert Behavior During Therapy: WFL for tasks assessed/performed Overall Cognitive Status: Within Functional Limits for tasks assessed                                        Exercises Total Joint Exercises Knee Flexion: AROM;Left;5 reps;Seated Goniometric ROM: 0-85    General Comments General comments (skin integrity, edema, etc.): Discussed car transfers, and plan for dc home      Pertinent Vitals/Pain Pain Assessment: 0-10 Pain Score: 2     Home Living                      Prior Function            PT Goals (current goals can now be found in the care plan section) Acute Rehab PT Goals Patient Stated Goal: return to riding motorcycle PT Goal Formulation: With patient Time For Goal Achievement: 07/23/21 Potential to Achieve Goals: Good Progress towards PT goals: Progressing toward  goals    Frequency    7X/week      PT Plan Current plan remains appropriate    Co-evaluation              AM-PAC PT "6 Clicks" Mobility   Outcome Measure  Help needed turning from your back to your side while in a flat bed without using bedrails?: None Help needed moving from lying on your back to sitting on the side of a flat bed without using bedrails?: None Help needed moving to and from a bed to a chair (including a wheelchair)?: None Help needed standing up from a chair using your arms (e.g., wheelchair or bedside chair)?: None Help needed to walk in hospital  room?: None Help needed climbing 3-5 steps with a railing? : None 6 Click Score: 24    End of Session   Activity Tolerance: Patient tolerated treatment well Patient left: in chair;with call bell/phone within reach Nurse Communication: Mobility status PT Visit Diagnosis: Other abnormalities of gait and mobility (R26.89)     Time: [id number] PT Time Calculation (min) (ACUTE ONLY): 32 min  Charges:  $Gait Training: 8-22 mins $Therapeutic Activity: 8-22 mins                     Roney Marion, PT  Acute Rehabilitation Services Pager [phone] Office [place] 07/20/2021, 1:12 PM
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
## 2021-07-21 ENCOUNTER — Telehealth: Payer: Self-pay | Admitting: *Deleted

## 2021-07-21 ENCOUNTER — Telehealth: Payer: Self-pay | Admitting: Orthopaedic Surgery

## 2021-07-21 DIAGNOSIS — Z96641 Presence of right artificial hip joint: Secondary | ICD-10-CM | POA: Diagnosis not present

## 2021-07-21 DIAGNOSIS — B1920 Unspecified viral hepatitis C without hepatic coma: Secondary | ICD-10-CM | POA: Diagnosis not present

## 2021-07-21 DIAGNOSIS — Z96652 Presence of left artificial knee joint: Secondary | ICD-10-CM | POA: Diagnosis not present

## 2021-07-21 DIAGNOSIS — I10 Essential (primary) hypertension: Secondary | ICD-10-CM | POA: Diagnosis not present

## 2021-07-21 DIAGNOSIS — Z7982 Long term (current) use of aspirin: Secondary | ICD-10-CM | POA: Diagnosis not present

## 2021-07-21 DIAGNOSIS — K7030 Alcoholic cirrhosis of liver without ascites: Secondary | ICD-10-CM | POA: Diagnosis not present

## 2021-07-21 DIAGNOSIS — Z471 Aftercare following joint replacement surgery: Secondary | ICD-10-CM | POA: Diagnosis not present

## 2021-07-21 NOTE — Telephone Encounter (Signed)
Have him restart his BP meds.  Thanks.

## 2021-07-21 NOTE — Telephone Encounter (Signed)
Ortho bundle d/c call attempted. No answer and left VM.

## 2021-07-21 NOTE — Telephone Encounter (Signed)
Ortho bundle D/C call attempted. 

## 2021-07-21 NOTE — Telephone Encounter (Signed)
[facility] called. She is the home PT working with patient. She wants Dr. Erlinda Hong to know that patient's BP was elevated today. 168/84. Her call back number is [phone]

## 2021-07-22 ENCOUNTER — Telehealth: Payer: Self-pay | Admitting: *Deleted

## 2021-07-22 NOTE — Telephone Encounter (Signed)
Attempted Ortho bundle D/C call 2nd attempt.

## 2021-07-22 NOTE — Telephone Encounter (Signed)
Joshua Burton is aware.

## 2021-07-23 ENCOUNTER — Telehealth: Payer: Self-pay | Admitting: *Deleted

## 2021-07-23 DIAGNOSIS — Z96641 Presence of right artificial hip joint: Secondary | ICD-10-CM | POA: Diagnosis not present

## 2021-07-23 DIAGNOSIS — Z7982 Long term (current) use of aspirin: Secondary | ICD-10-CM | POA: Diagnosis not present

## 2021-07-23 DIAGNOSIS — Z96652 Presence of left artificial knee joint: Secondary | ICD-10-CM | POA: Diagnosis not present

## 2021-07-23 DIAGNOSIS — K7030 Alcoholic cirrhosis of liver without ascites: Secondary | ICD-10-CM | POA: Diagnosis not present

## 2021-07-23 DIAGNOSIS — Z471 Aftercare following joint replacement surgery: Secondary | ICD-10-CM | POA: Diagnosis not present

## 2021-07-23 DIAGNOSIS — I10 Essential (primary) hypertension: Secondary | ICD-10-CM | POA: Diagnosis not present

## 2021-07-23 DIAGNOSIS — B1920 Unspecified viral hepatitis C without hepatic coma: Secondary | ICD-10-CM | POA: Diagnosis not present

## 2021-07-23 NOTE — Telephone Encounter (Signed)
Ortho bundle D/C call completed. Delayed, but attempted multiple times with no return call until today.

## 2021-07-26 ENCOUNTER — Telehealth: Payer: Self-pay | Admitting: *Deleted

## 2021-07-26 ENCOUNTER — Other Ambulatory Visit: Payer: Self-pay | Admitting: Physician Assistant

## 2021-07-26 MED ORDER — OXYCODONE-ACETAMINOPHEN 5-325 MG PO TABS: 1.0000 | ORAL_TABLET | Freq: Three times a day (TID) | ORAL | 0 refills | Status: DC | PRN

## 2021-07-26 MED ORDER — METHOCARBAMOL 500 MG PO TABS: 500.0000 mg | ORAL_TABLET | Freq: Two times a day (BID) | ORAL | 2 refills | Status: DC | PRN

## 2021-07-26 NOTE — Telephone Encounter (Signed)
Patient called requesting refill of pain medication and muscle relaxer. New pharmacy in chart in [place]. Thanks.

## 2021-07-26 NOTE — Telephone Encounter (Signed)
Sent in to the [place] in system

## 2021-07-27 DIAGNOSIS — Z96641 Presence of right artificial hip joint: Secondary | ICD-10-CM | POA: Diagnosis not present

## 2021-07-27 DIAGNOSIS — B1920 Unspecified viral hepatitis C without hepatic coma: Secondary | ICD-10-CM | POA: Diagnosis not present

## 2021-07-27 DIAGNOSIS — I10 Essential (primary) hypertension: Secondary | ICD-10-CM | POA: Diagnosis not present

## 2021-07-27 DIAGNOSIS — K7030 Alcoholic cirrhosis of liver without ascites: Secondary | ICD-10-CM | POA: Diagnosis not present

## 2021-07-27 DIAGNOSIS — Z96652 Presence of left artificial knee joint: Secondary | ICD-10-CM | POA: Diagnosis not present

## 2021-07-27 DIAGNOSIS — Z471 Aftercare following joint replacement surgery: Secondary | ICD-10-CM | POA: Diagnosis not present

## 2021-07-27 DIAGNOSIS — Z7982 Long term (current) use of aspirin: Secondary | ICD-10-CM | POA: Diagnosis not present

## 2021-07-28 DIAGNOSIS — B1920 Unspecified viral hepatitis C without hepatic coma: Secondary | ICD-10-CM | POA: Diagnosis not present

## 2021-07-28 DIAGNOSIS — Z7982 Long term (current) use of aspirin: Secondary | ICD-10-CM | POA: Diagnosis not present

## 2021-07-28 DIAGNOSIS — I10 Essential (primary) hypertension: Secondary | ICD-10-CM | POA: Diagnosis not present

## 2021-07-28 DIAGNOSIS — K7030 Alcoholic cirrhosis of liver without ascites: Secondary | ICD-10-CM | POA: Diagnosis not present

## 2021-07-28 DIAGNOSIS — Z96641 Presence of right artificial hip joint: Secondary | ICD-10-CM | POA: Diagnosis not present

## 2021-07-28 DIAGNOSIS — Z471 Aftercare following joint replacement surgery: Secondary | ICD-10-CM | POA: Diagnosis not present

## 2021-07-28 DIAGNOSIS — Z96652 Presence of left artificial knee joint: Secondary | ICD-10-CM | POA: Diagnosis not present

## 2021-07-30 ENCOUNTER — Encounter: Payer: Medicare HMO | Admitting: Orthopaedic Surgery

## 2021-08-03 ENCOUNTER — Encounter: Payer: Medicare HMO | Admitting: Orthopaedic Surgery

## 2021-08-03 DIAGNOSIS — Z96652 Presence of left artificial knee joint: Secondary | ICD-10-CM | POA: Diagnosis not present

## 2021-08-03 DIAGNOSIS — I10 Essential (primary) hypertension: Secondary | ICD-10-CM | POA: Diagnosis not present

## 2021-08-03 DIAGNOSIS — K7030 Alcoholic cirrhosis of liver without ascites: Secondary | ICD-10-CM | POA: Diagnosis not present

## 2021-08-03 DIAGNOSIS — B1920 Unspecified viral hepatitis C without hepatic coma: Secondary | ICD-10-CM | POA: Diagnosis not present

## 2021-08-03 DIAGNOSIS — Z7982 Long term (current) use of aspirin: Secondary | ICD-10-CM | POA: Diagnosis not present

## 2021-08-03 DIAGNOSIS — Z471 Aftercare following joint replacement surgery: Secondary | ICD-10-CM | POA: Diagnosis not present

## 2021-08-03 DIAGNOSIS — Z96641 Presence of right artificial hip joint: Secondary | ICD-10-CM | POA: Diagnosis not present

## 2021-08-05 ENCOUNTER — Ambulatory Visit (INDEPENDENT_AMBULATORY_CARE_PROVIDER_SITE_OTHER): Payer: Medicare HMO | Admitting: Physician Assistant

## 2021-08-05 ENCOUNTER — Encounter: Payer: Self-pay | Admitting: Orthopaedic Surgery

## 2021-08-05 ENCOUNTER — Other Ambulatory Visit: Payer: Self-pay

## 2021-08-05 ENCOUNTER — Telehealth: Payer: Self-pay | Admitting: *Deleted

## 2021-08-05 ENCOUNTER — Other Ambulatory Visit: Payer: Self-pay | Admitting: Physician Assistant

## 2021-08-05 VITALS — Ht 70.5 in | Wt 206.0 lb

## 2021-08-05 DIAGNOSIS — Z96652 Presence of left artificial knee joint: Secondary | ICD-10-CM

## 2021-08-05 MED ORDER — HYDROCODONE-ACETAMINOPHEN 5-325 MG PO TABS: 1.0000 | ORAL_TABLET | Freq: Three times a day (TID) | ORAL | 0 refills | Status: DC | PRN

## 2021-08-05 NOTE — Telephone Encounter (Signed)
Ortho bundle 14 day call completed. 
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
## 2021-08-09 DIAGNOSIS — K7030 Alcoholic cirrhosis of liver without ascites: Secondary | ICD-10-CM | POA: Diagnosis not present

## 2021-08-09 DIAGNOSIS — Z7982 Long term (current) use of aspirin: Secondary | ICD-10-CM | POA: Diagnosis not present

## 2021-08-09 DIAGNOSIS — Z471 Aftercare following joint replacement surgery: Secondary | ICD-10-CM | POA: Diagnosis not present

## 2021-08-09 DIAGNOSIS — B1920 Unspecified viral hepatitis C without hepatic coma: Secondary | ICD-10-CM | POA: Diagnosis not present

## 2021-08-09 DIAGNOSIS — I10 Essential (primary) hypertension: Secondary | ICD-10-CM | POA: Diagnosis not present

## 2021-08-09 DIAGNOSIS — Z96641 Presence of right artificial hip joint: Secondary | ICD-10-CM | POA: Diagnosis not present

## 2021-08-09 DIAGNOSIS — Z96652 Presence of left artificial knee joint: Secondary | ICD-10-CM | POA: Diagnosis not present

## 2021-08-11 DIAGNOSIS — R293 Abnormal posture: Secondary | ICD-10-CM | POA: Diagnosis not present

## 2021-08-11 DIAGNOSIS — R262 Difficulty in walking, not elsewhere classified: Secondary | ICD-10-CM | POA: Diagnosis not present

## 2021-08-11 DIAGNOSIS — R54 Age-related physical debility: Secondary | ICD-10-CM | POA: Diagnosis not present

## 2021-08-11 DIAGNOSIS — M25562 Pain in left knee: Secondary | ICD-10-CM | POA: Diagnosis not present

## 2021-08-11 DIAGNOSIS — M6281 Muscle weakness (generalized): Secondary | ICD-10-CM | POA: Diagnosis not present

## 2021-08-18 DIAGNOSIS — R262 Difficulty in walking, not elsewhere classified: Secondary | ICD-10-CM | POA: Diagnosis not present

## 2021-08-18 DIAGNOSIS — M25562 Pain in left knee: Secondary | ICD-10-CM | POA: Diagnosis not present

## 2021-08-18 DIAGNOSIS — M6281 Muscle weakness (generalized): Secondary | ICD-10-CM | POA: Diagnosis not present

## 2021-08-18 DIAGNOSIS — R293 Abnormal posture: Secondary | ICD-10-CM | POA: Diagnosis not present

## 2021-08-18 DIAGNOSIS — R54 Age-related physical debility: Secondary | ICD-10-CM | POA: Diagnosis not present

## 2021-08-20 DIAGNOSIS — R293 Abnormal posture: Secondary | ICD-10-CM | POA: Diagnosis not present

## 2021-08-20 DIAGNOSIS — M25562 Pain in left knee: Secondary | ICD-10-CM | POA: Diagnosis not present

## 2021-08-20 DIAGNOSIS — R262 Difficulty in walking, not elsewhere classified: Secondary | ICD-10-CM | POA: Diagnosis not present

## 2021-08-20 DIAGNOSIS — R54 Age-related physical debility: Secondary | ICD-10-CM | POA: Diagnosis not present

## 2021-08-20 DIAGNOSIS — M6281 Muscle weakness (generalized): Secondary | ICD-10-CM | POA: Diagnosis not present

## 2021-08-23 ENCOUNTER — Telehealth: Payer: Self-pay | Admitting: Orthopaedic Surgery

## 2021-08-23 ENCOUNTER — Other Ambulatory Visit: Payer: Self-pay | Admitting: Physician Assistant

## 2021-08-23 DIAGNOSIS — R262 Difficulty in walking, not elsewhere classified: Secondary | ICD-10-CM | POA: Diagnosis not present

## 2021-08-23 DIAGNOSIS — M25562 Pain in left knee: Secondary | ICD-10-CM | POA: Diagnosis not present

## 2021-08-23 DIAGNOSIS — R54 Age-related physical debility: Secondary | ICD-10-CM | POA: Diagnosis not present

## 2021-08-23 DIAGNOSIS — R293 Abnormal posture: Secondary | ICD-10-CM | POA: Diagnosis not present

## 2021-08-23 DIAGNOSIS — M6281 Muscle weakness (generalized): Secondary | ICD-10-CM | POA: Diagnosis not present

## 2021-08-23 NOTE — Telephone Encounter (Signed)
permanent

## 2021-08-23 NOTE — Telephone Encounter (Signed)
Ready for pick up at the front desk. Patient aware.

## 2021-08-23 NOTE — Telephone Encounter (Signed)
Pt. Called requesting to drop off handicap placard renewal. Pt states he will drop form off today and would like to pick up tomorrow. Please call pt when ready for pick up at [phone].

## 2021-08-27 DIAGNOSIS — R54 Age-related physical debility: Secondary | ICD-10-CM | POA: Diagnosis not present

## 2021-08-27 DIAGNOSIS — R293 Abnormal posture: Secondary | ICD-10-CM | POA: Diagnosis not present

## 2021-08-27 DIAGNOSIS — M6281 Muscle weakness (generalized): Secondary | ICD-10-CM | POA: Diagnosis not present

## 2021-08-27 DIAGNOSIS — R262 Difficulty in walking, not elsewhere classified: Secondary | ICD-10-CM | POA: Diagnosis not present

## 2021-08-27 DIAGNOSIS — M25562 Pain in left knee: Secondary | ICD-10-CM | POA: Diagnosis not present

## 2021-08-30 DIAGNOSIS — R293 Abnormal posture: Secondary | ICD-10-CM | POA: Diagnosis not present

## 2021-08-30 DIAGNOSIS — M6281 Muscle weakness (generalized): Secondary | ICD-10-CM | POA: Diagnosis not present

## 2021-08-30 DIAGNOSIS — M25562 Pain in left knee: Secondary | ICD-10-CM | POA: Diagnosis not present

## 2021-08-30 DIAGNOSIS — R262 Difficulty in walking, not elsewhere classified: Secondary | ICD-10-CM | POA: Diagnosis not present

## 2021-08-30 DIAGNOSIS — R54 Age-related physical debility: Secondary | ICD-10-CM | POA: Diagnosis not present

## 2021-09-01 DIAGNOSIS — R262 Difficulty in walking, not elsewhere classified: Secondary | ICD-10-CM | POA: Diagnosis not present

## 2021-09-01 DIAGNOSIS — M25562 Pain in left knee: Secondary | ICD-10-CM | POA: Diagnosis not present

## 2021-09-01 DIAGNOSIS — R54 Age-related physical debility: Secondary | ICD-10-CM | POA: Diagnosis not present

## 2021-09-01 DIAGNOSIS — M6281 Muscle weakness (generalized): Secondary | ICD-10-CM | POA: Diagnosis not present

## 2021-09-01 DIAGNOSIS — R293 Abnormal posture: Secondary | ICD-10-CM | POA: Diagnosis not present

## 2021-09-02 DIAGNOSIS — M6281 Muscle weakness (generalized): Secondary | ICD-10-CM | POA: Diagnosis not present

## 2021-09-02 DIAGNOSIS — M25562 Pain in left knee: Secondary | ICD-10-CM | POA: Diagnosis not present

## 2021-09-02 DIAGNOSIS — R262 Difficulty in walking, not elsewhere classified: Secondary | ICD-10-CM | POA: Diagnosis not present

## 2021-09-02 DIAGNOSIS — R293 Abnormal posture: Secondary | ICD-10-CM | POA: Diagnosis not present

## 2021-09-02 DIAGNOSIS — R54 Age-related physical debility: Secondary | ICD-10-CM | POA: Diagnosis not present

## 2021-09-03 ENCOUNTER — Telehealth: Payer: Self-pay | Admitting: *Deleted

## 2021-09-03 ENCOUNTER — Ambulatory Visit (INDEPENDENT_AMBULATORY_CARE_PROVIDER_SITE_OTHER): Payer: Medicare HMO | Admitting: Orthopaedic Surgery

## 2021-09-03 ENCOUNTER — Other Ambulatory Visit: Payer: Self-pay

## 2021-09-03 ENCOUNTER — Ambulatory Visit: Payer: Self-pay

## 2021-09-03 DIAGNOSIS — Z96652 Presence of left artificial knee joint: Secondary | ICD-10-CM | POA: Diagnosis not present

## 2021-09-03 DIAGNOSIS — M1612 Unilateral primary osteoarthritis, left hip: Secondary | ICD-10-CM | POA: Diagnosis not present

## 2021-09-03 NOTE — Telephone Encounter (Signed)
Ortho bundle 30 day call and survey completed. ?
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
## 2021-09-06 DIAGNOSIS — R293 Abnormal posture: Secondary | ICD-10-CM | POA: Diagnosis not present

## 2021-09-06 DIAGNOSIS — R262 Difficulty in walking, not elsewhere classified: Secondary | ICD-10-CM | POA: Diagnosis not present

## 2021-09-06 DIAGNOSIS — M25562 Pain in left knee: Secondary | ICD-10-CM | POA: Diagnosis not present

## 2021-09-06 DIAGNOSIS — R54 Age-related physical debility: Secondary | ICD-10-CM | POA: Diagnosis not present

## 2021-09-06 DIAGNOSIS — M6281 Muscle weakness (generalized): Secondary | ICD-10-CM | POA: Diagnosis not present

## 2021-09-08 DIAGNOSIS — R262 Difficulty in walking, not elsewhere classified: Secondary | ICD-10-CM | POA: Diagnosis not present

## 2021-09-08 DIAGNOSIS — M25562 Pain in left knee: Secondary | ICD-10-CM | POA: Diagnosis not present

## 2021-09-08 DIAGNOSIS — M6281 Muscle weakness (generalized): Secondary | ICD-10-CM | POA: Diagnosis not present

## 2021-09-08 DIAGNOSIS — R293 Abnormal posture: Secondary | ICD-10-CM | POA: Diagnosis not present

## 2021-09-08 DIAGNOSIS — R54 Age-related physical debility: Secondary | ICD-10-CM | POA: Diagnosis not present

## 2021-09-14 DIAGNOSIS — I10 Essential (primary) hypertension: Secondary | ICD-10-CM | POA: Diagnosis not present

## 2021-09-14 DIAGNOSIS — M1990 Unspecified osteoarthritis, unspecified site: Secondary | ICD-10-CM | POA: Diagnosis not present

## 2021-09-14 DIAGNOSIS — N400 Enlarged prostate without lower urinary tract symptoms: Secondary | ICD-10-CM | POA: Diagnosis not present

## 2021-09-16 DIAGNOSIS — R262 Difficulty in walking, not elsewhere classified: Secondary | ICD-10-CM | POA: Diagnosis not present

## 2021-09-16 DIAGNOSIS — M25562 Pain in left knee: Secondary | ICD-10-CM | POA: Diagnosis not present

## 2021-09-16 DIAGNOSIS — R54 Age-related physical debility: Secondary | ICD-10-CM | POA: Diagnosis not present

## 2021-09-16 DIAGNOSIS — M6281 Muscle weakness (generalized): Secondary | ICD-10-CM | POA: Diagnosis not present

## 2021-09-16 DIAGNOSIS — R293 Abnormal posture: Secondary | ICD-10-CM | POA: Diagnosis not present

## 2021-09-21 DIAGNOSIS — M6281 Muscle weakness (generalized): Secondary | ICD-10-CM | POA: Diagnosis not present

## 2021-09-21 DIAGNOSIS — R293 Abnormal posture: Secondary | ICD-10-CM | POA: Diagnosis not present

## 2021-09-21 DIAGNOSIS — R262 Difficulty in walking, not elsewhere classified: Secondary | ICD-10-CM | POA: Diagnosis not present

## 2021-09-21 DIAGNOSIS — M25562 Pain in left knee: Secondary | ICD-10-CM | POA: Diagnosis not present

## 2021-09-21 DIAGNOSIS — R54 Age-related physical debility: Secondary | ICD-10-CM | POA: Diagnosis not present

## 2021-09-23 DIAGNOSIS — M6281 Muscle weakness (generalized): Secondary | ICD-10-CM | POA: Diagnosis not present

## 2021-09-23 DIAGNOSIS — M25562 Pain in left knee: Secondary | ICD-10-CM | POA: Diagnosis not present

## 2021-09-23 DIAGNOSIS — R293 Abnormal posture: Secondary | ICD-10-CM | POA: Diagnosis not present

## 2021-09-23 DIAGNOSIS — R54 Age-related physical debility: Secondary | ICD-10-CM | POA: Diagnosis not present

## 2021-09-23 DIAGNOSIS — R262 Difficulty in walking, not elsewhere classified: Secondary | ICD-10-CM | POA: Diagnosis not present

## 2021-10-11 DIAGNOSIS — R293 Abnormal posture: Secondary | ICD-10-CM | POA: Diagnosis not present

## 2021-10-11 DIAGNOSIS — R54 Age-related physical debility: Secondary | ICD-10-CM | POA: Diagnosis not present

## 2021-10-11 DIAGNOSIS — R262 Difficulty in walking, not elsewhere classified: Secondary | ICD-10-CM | POA: Diagnosis not present

## 2021-10-11 DIAGNOSIS — M6281 Muscle weakness (generalized): Secondary | ICD-10-CM | POA: Diagnosis not present

## 2021-10-11 DIAGNOSIS — M25562 Pain in left knee: Secondary | ICD-10-CM | POA: Diagnosis not present

## 2021-10-13 DIAGNOSIS — M25532 Pain in left wrist: Secondary | ICD-10-CM | POA: Diagnosis not present

## 2021-10-13 DIAGNOSIS — R262 Difficulty in walking, not elsewhere classified: Secondary | ICD-10-CM | POA: Diagnosis not present

## 2021-10-13 DIAGNOSIS — M25531 Pain in right wrist: Secondary | ICD-10-CM | POA: Diagnosis not present

## 2021-10-13 DIAGNOSIS — R600 Localized edema: Secondary | ICD-10-CM | POA: Diagnosis not present

## 2021-10-13 DIAGNOSIS — M25562 Pain in left knee: Secondary | ICD-10-CM | POA: Diagnosis not present

## 2021-10-13 DIAGNOSIS — M25511 Pain in right shoulder: Secondary | ICD-10-CM | POA: Diagnosis not present

## 2021-10-13 DIAGNOSIS — M1990 Unspecified osteoarthritis, unspecified site: Secondary | ICD-10-CM | POA: Diagnosis not present

## 2021-10-13 DIAGNOSIS — M6281 Muscle weakness (generalized): Secondary | ICD-10-CM | POA: Diagnosis not present

## 2021-10-13 DIAGNOSIS — G5601 Carpal tunnel syndrome, right upper limb: Secondary | ICD-10-CM | POA: Diagnosis not present

## 2021-10-15 ENCOUNTER — Other Ambulatory Visit: Payer: Self-pay

## 2021-10-15 ENCOUNTER — Ambulatory Visit (INDEPENDENT_AMBULATORY_CARE_PROVIDER_SITE_OTHER): Payer: Medicare HMO | Admitting: Orthopaedic Surgery

## 2021-10-15 ENCOUNTER — Encounter: Payer: Self-pay | Admitting: Orthopaedic Surgery

## 2021-10-15 ENCOUNTER — Telehealth: Payer: Self-pay | Admitting: *Deleted

## 2021-10-15 DIAGNOSIS — M25462 Effusion, left knee: Secondary | ICD-10-CM

## 2021-10-15 DIAGNOSIS — Z96652 Presence of left artificial knee joint: Secondary | ICD-10-CM | POA: Diagnosis not present

## 2021-10-15 MED ORDER — BUPIVACAINE HCL 0.5 % IJ SOLN
2.0000 mL | INTRAMUSCULAR | Status: AC | PRN
  Administered 2021-10-15: 2 mL via INTRA_ARTICULAR

## 2021-10-15 MED ORDER — LIDOCAINE HCL 1 % IJ SOLN
2.0000 mL | INTRAMUSCULAR | Status: AC | PRN
  Administered 2021-10-15: 2 mL

## 2021-10-15 NOTE — Telephone Encounter (Signed)
Ortho bundle 90 day call and survey completed. 
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
## 2021-10-18 DIAGNOSIS — R262 Difficulty in walking, not elsewhere classified: Secondary | ICD-10-CM | POA: Diagnosis not present

## 2021-10-18 DIAGNOSIS — R293 Abnormal posture: Secondary | ICD-10-CM | POA: Diagnosis not present

## 2021-10-18 DIAGNOSIS — M25562 Pain in left knee: Secondary | ICD-10-CM | POA: Diagnosis not present

## 2021-10-18 DIAGNOSIS — R54 Age-related physical debility: Secondary | ICD-10-CM | POA: Diagnosis not present

## 2021-10-18 DIAGNOSIS — M6281 Muscle weakness (generalized): Secondary | ICD-10-CM | POA: Diagnosis not present

## 2021-10-20 DIAGNOSIS — M25562 Pain in left knee: Secondary | ICD-10-CM | POA: Diagnosis not present

## 2021-10-20 DIAGNOSIS — M1990 Unspecified osteoarthritis, unspecified site: Secondary | ICD-10-CM | POA: Diagnosis not present

## 2021-10-20 DIAGNOSIS — M6281 Muscle weakness (generalized): Secondary | ICD-10-CM | POA: Diagnosis not present

## 2021-10-20 DIAGNOSIS — R262 Difficulty in walking, not elsewhere classified: Secondary | ICD-10-CM | POA: Diagnosis not present

## 2021-10-20 DIAGNOSIS — M25511 Pain in right shoulder: Secondary | ICD-10-CM | POA: Diagnosis not present

## 2021-10-20 DIAGNOSIS — M25532 Pain in left wrist: Secondary | ICD-10-CM | POA: Diagnosis not present

## 2021-10-20 DIAGNOSIS — G5601 Carpal tunnel syndrome, right upper limb: Secondary | ICD-10-CM | POA: Diagnosis not present

## 2021-10-20 DIAGNOSIS — M25531 Pain in right wrist: Secondary | ICD-10-CM | POA: Diagnosis not present

## 2021-10-20 DIAGNOSIS — R600 Localized edema: Secondary | ICD-10-CM | POA: Diagnosis not present

## 2021-10-25 DIAGNOSIS — R262 Difficulty in walking, not elsewhere classified: Secondary | ICD-10-CM | POA: Diagnosis not present

## 2021-10-25 DIAGNOSIS — M6281 Muscle weakness (generalized): Secondary | ICD-10-CM | POA: Diagnosis not present

## 2021-10-25 DIAGNOSIS — R54 Age-related physical debility: Secondary | ICD-10-CM | POA: Diagnosis not present

## 2021-10-25 DIAGNOSIS — M25562 Pain in left knee: Secondary | ICD-10-CM | POA: Diagnosis not present

## 2021-10-25 DIAGNOSIS — R293 Abnormal posture: Secondary | ICD-10-CM | POA: Diagnosis not present

## 2021-11-07 HISTORY — PX: CARPAL TUNNEL RELEASE: SHX101

## 2021-11-24 DIAGNOSIS — G5601 Carpal tunnel syndrome, right upper limb: Secondary | ICD-10-CM | POA: Diagnosis not present

## 2021-12-14 DIAGNOSIS — D225 Melanocytic nevi of trunk: Secondary | ICD-10-CM | POA: Diagnosis not present

## 2021-12-14 DIAGNOSIS — T148XXA Other injury of unspecified body region, initial encounter: Secondary | ICD-10-CM | POA: Diagnosis not present

## 2021-12-14 DIAGNOSIS — L570 Actinic keratosis: Secondary | ICD-10-CM | POA: Diagnosis not present

## 2021-12-14 DIAGNOSIS — L218 Other seborrheic dermatitis: Secondary | ICD-10-CM | POA: Diagnosis not present

## 2021-12-14 DIAGNOSIS — X32XXXD Exposure to sunlight, subsequent encounter: Secondary | ICD-10-CM | POA: Diagnosis not present

## 2021-12-30 DIAGNOSIS — K7460 Unspecified cirrhosis of liver: Secondary | ICD-10-CM | POA: Diagnosis not present
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
## 2022-01-06 ENCOUNTER — Ambulatory Visit (INDEPENDENT_AMBULATORY_CARE_PROVIDER_SITE_OTHER): Payer: Medicare HMO | Admitting: Ophthalmology

## 2022-01-06 ENCOUNTER — Other Ambulatory Visit: Payer: Self-pay

## 2022-01-06 ENCOUNTER — Encounter (INDEPENDENT_AMBULATORY_CARE_PROVIDER_SITE_OTHER): Payer: Self-pay | Admitting: Ophthalmology

## 2022-01-06 DIAGNOSIS — K7030 Alcoholic cirrhosis of liver without ascites: Secondary | ICD-10-CM | POA: Diagnosis not present

## 2022-01-06 DIAGNOSIS — H35373 Puckering of macula, bilateral: Secondary | ICD-10-CM | POA: Diagnosis not present

## 2022-01-06 DIAGNOSIS — I10 Essential (primary) hypertension: Secondary | ICD-10-CM | POA: Diagnosis not present

## 2022-01-06 DIAGNOSIS — Z8669 Personal history of other diseases of the nervous system and sense organs: Secondary | ICD-10-CM

## 2022-01-06 DIAGNOSIS — Z961 Presence of intraocular lens: Secondary | ICD-10-CM | POA: Diagnosis not present

## 2022-01-06 DIAGNOSIS — H3322 Serous retinal detachment, left eye: Secondary | ICD-10-CM

## 2022-01-06 DIAGNOSIS — H35033 Hypertensive retinopathy, bilateral: Secondary | ICD-10-CM | POA: Diagnosis not present

## 2022-01-06 DIAGNOSIS — H401130 Primary open-angle glaucoma, bilateral, stage unspecified: Secondary | ICD-10-CM | POA: Diagnosis not present

## 2022-01-07 ENCOUNTER — Encounter (INDEPENDENT_AMBULATORY_CARE_PROVIDER_SITE_OTHER): Payer: Self-pay | Admitting: Ophthalmology

## 2022-01-12 ENCOUNTER — Encounter (INDEPENDENT_AMBULATORY_CARE_PROVIDER_SITE_OTHER): Payer: Medicare HMO | Admitting: Ophthalmology

## 2022-01-13 ENCOUNTER — Ambulatory Visit: Payer: Self-pay

## 2022-01-13 ENCOUNTER — Other Ambulatory Visit: Payer: Self-pay

## 2022-01-13 ENCOUNTER — Encounter: Payer: Self-pay | Admitting: Orthopaedic Surgery

## 2022-01-13 ENCOUNTER — Ambulatory Visit: Payer: Medicare HMO | Admitting: Orthopaedic Surgery

## 2022-01-13 DIAGNOSIS — Z96652 Presence of left artificial knee joint: Secondary | ICD-10-CM

## 2022-01-13 MED ORDER — ACETAMINOPHEN ER 650 MG PO TBCR: 650.0000 mg | EXTENDED_RELEASE_TABLET | Freq: Three times a day (TID) | ORAL | 2 refills | Status: DC | PRN
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
## 2022-01-25 DIAGNOSIS — H04123 Dry eye syndrome of bilateral lacrimal glands: Secondary | ICD-10-CM | POA: Diagnosis not present

## 2022-01-25 DIAGNOSIS — H401132 Primary open-angle glaucoma, bilateral, moderate stage: Secondary | ICD-10-CM | POA: Diagnosis not present

## 2022-03-08 DIAGNOSIS — H401132 Primary open-angle glaucoma, bilateral, moderate stage: Secondary | ICD-10-CM | POA: Diagnosis not present

## 2022-03-25 DIAGNOSIS — C44319 Basal cell carcinoma of skin of other parts of face: Secondary | ICD-10-CM | POA: Diagnosis not present

## 2022-03-25 DIAGNOSIS — X32XXXD Exposure to sunlight, subsequent encounter: Secondary | ICD-10-CM | POA: Diagnosis not present

## 2022-03-25 DIAGNOSIS — L570 Actinic keratosis: Secondary | ICD-10-CM | POA: Diagnosis not present

## 2022-04-18 DIAGNOSIS — H04123 Dry eye syndrome of bilateral lacrimal glands: Secondary | ICD-10-CM | POA: Diagnosis not present

## 2022-04-18 DIAGNOSIS — H401132 Primary open-angle glaucoma, bilateral, moderate stage: Secondary | ICD-10-CM | POA: Diagnosis not present

## 2022-05-19 ENCOUNTER — Ambulatory Visit (INDEPENDENT_AMBULATORY_CARE_PROVIDER_SITE_OTHER): Payer: Medicare HMO

## 2022-05-19 ENCOUNTER — Other Ambulatory Visit: Payer: Self-pay

## 2022-05-19 ENCOUNTER — Encounter: Payer: Self-pay | Admitting: Orthopaedic Surgery

## 2022-05-19 ENCOUNTER — Ambulatory Visit: Payer: Medicare HMO | Admitting: Orthopaedic Surgery

## 2022-05-19 VITALS — Ht 71.5 in | Wt 205.0 lb

## 2022-05-19 DIAGNOSIS — M1612 Unilateral primary osteoarthritis, left hip: Secondary | ICD-10-CM
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
## 2022-05-24 DIAGNOSIS — L570 Actinic keratosis: Secondary | ICD-10-CM | POA: Diagnosis not present

## 2022-05-24 DIAGNOSIS — X32XXXD Exposure to sunlight, subsequent encounter: Secondary | ICD-10-CM | POA: Diagnosis not present

## 2022-05-24 DIAGNOSIS — S41052A Open bite of left shoulder, initial encounter: Secondary | ICD-10-CM | POA: Diagnosis not present

## 2022-05-27 ENCOUNTER — Telehealth: Payer: Self-pay | Admitting: Orthopaedic Surgery

## 2022-05-27 NOTE — Telephone Encounter (Signed)
Patient called advised he would like to get the injection in his left hip. The number to contact patient is [phone]

## 2022-05-29 NOTE — Telephone Encounter (Signed)
He's scheduled for left THA on 8/21 so he can't have an injection at this time or his surgery will have to be postponed for 3 months.

## 2022-05-30 NOTE — Telephone Encounter (Signed)
Advised patient. He wants to keep his surgery date and not get the injection.

## 2022-06-02 ENCOUNTER — Telehealth: Payer: Self-pay

## 2022-06-02 NOTE — Patient Outreach (Signed)
  Care Management   Outreach Note  06/02/2022 Name: Joshua Burton MRN: [id number] DOB: 08-06-1951  An unsuccessful telephone outreach was attempted today. The patient was referred to the case management team for assistance with care management and care coordination.    Follow Up Plan:  A HIPAA compliant voice message was left today requesting a return call.   [place] Management [phone]

## 2022-06-07 DIAGNOSIS — Z1389 Encounter for screening for other disorder: Secondary | ICD-10-CM | POA: Diagnosis not present

## 2022-06-07 DIAGNOSIS — Z0000 Encounter for general adult medical examination without abnormal findings: Secondary | ICD-10-CM | POA: Diagnosis not present

## 2022-06-09 ENCOUNTER — Telehealth: Payer: Self-pay | Admitting: Orthopaedic Surgery

## 2022-06-09 NOTE — Telephone Encounter (Signed)
Pt called requesting pain medication until his surgery. Pt states his hip is in pain Please send medication to [facility]. Pt phone number is [phone].

## 2022-06-10 ENCOUNTER — Other Ambulatory Visit: Payer: Self-pay | Admitting: Physician Assistant

## 2022-06-10 MED ORDER — TRAMADOL HCL 50 MG PO TABS: 50.0000 mg | ORAL_TABLET | Freq: Three times a day (TID) | ORAL | 2 refills | Status: DC | PRN

## 2022-06-10 NOTE — Telephone Encounter (Signed)
I sent in tramadol

## 2022-06-17 NOTE — Pre-Procedure Instructions (Signed)
Surgical Instructions    Your procedure is scheduled on Monday, August 21st.  Report to [facility] Main Entrance "A" at 07:30 A.M., then check in with the Admitting office.  Call this number if you have problems the morning of surgery:  [phone]   If you have any questions prior to your surgery date call [phone]: Open Monday-Friday 8am-4pm    Remember:  Do not eat after midnight the night before your surgery  You may drink clear liquids until 07:00 AM the morning of your surgery.   Clear liquids allowed are: Water, Non-Citrus Juices (without pulp), Carbonated Beverages, Clear Tea, Black Coffee Only (NO MILK, CREAM OR POWDERED CREAMER of any kind), and Gatorade.   Patient Instructions  The night before surgery:  No food after midnight. ONLY clear liquids after midnight  The day of surgery (if you do NOT have diabetes):  Drink ONE (1) Pre-Surgery Clear Ensure by 07:00 AM the morning of surgery. Drink in one sitting. Do not sip.  This drink was given to you during your hospital  pre-op appointment visit.  Nothing else to drink after completing the  Pre-Surgery Clear Ensure.          If you have questions, please contact your surgeon's office.      Take these medicines the morning of surgery with A SIP OF WATER  cloNIDine (CATAPRES)  If needed: acetaminophen (TYLENOL 8 HOUR ARTHRITIS PAIN)  HYDROcodone-acetaminophen (NORCO)  Polyethyl Glycol-Propyl Glycol (SYSTANE ULTRA) eye drops traMADol (ULTRAM)   As of today, STOP taking any Aspirin (unless otherwise instructed by your surgeon) Aleve, Naproxen, Ibuprofen, Motrin, Advil, Goody's, BC's, all herbal medications, fish oil, and all vitamins.                     Do NOT Smoke (Tobacco/Vaping) for 24 hours prior to your procedure.  If you use a CPAP at night, you may bring your mask/headgear for your overnight stay.   Contacts, glasses, piercing's, hearing aid's, dentures or partials may not be worn into surgery,  please bring cases for these belongings.    For patients admitted to the hospital, discharge time will be determined by your treatment team.   Patients discharged the day of surgery will not be allowed to drive home, and someone needs to stay with them for 24 hours.  SURGICAL WAITING ROOM VISITATION Patients having surgery or a procedure may have no more than 2 support people in the waiting area - these visitors may rotate.   Children under the age of 71 must have an adult with them who is not the patient. If the patient needs to stay at the hospital during part of their recovery, the visitor guidelines for inpatient rooms apply. Pre-op nurse will coordinate an appropriate time for 1 support person to accompany patient in pre-op.  This support person may not rotate.   Please refer to the [facility] website for the visitor guidelines for Inpatients (after your surgery is over and you are in a regular room).    Special instructions:   [place]- Preparing For Surgery  Before surgery, you can play an important role. Because skin is not sterile, your skin needs to be as free of germs as possible. You can reduce the number of germs on your skin by washing with CHG (chlorahexidine gluconate) Soap before surgery.  CHG is an antiseptic cleaner which kills germs and bonds with the skin to continue killing germs even after washing.    Oral Hygiene  is also important to reduce your risk of infection.  Remember - BRUSH YOUR TEETH THE MORNING OF SURGERY WITH YOUR REGULAR TOOTHPASTE  Please do not use if you have an allergy to CHG or antibacterial soaps. If your skin becomes reddened/irritated stop using the CHG.  Do not shave (including legs and underarms) for at least 48 hours prior to first CHG shower. It is OK to shave your face.  Please follow these instructions carefully.   Shower the NIGHT BEFORE SURGERY and the MORNING OF SURGERY  If you chose to wash your hair, wash your hair first as usual  with your normal shampoo.  After you shampoo, rinse your hair and body thoroughly to remove the shampoo.  Use CHG Soap as you would any other liquid soap. You can apply CHG directly to the skin and wash gently with a scrungie or a clean washcloth.   Apply the CHG Soap to your body ONLY FROM THE NECK DOWN.  Do not use on open wounds or open sores. Avoid contact with your eyes, ears, mouth and genitals (private parts). Wash Face and genitals (private parts)  with your normal soap.   Wash thoroughly, paying special attention to the area where your surgery will be performed.  Thoroughly rinse your body with warm water from the neck down.  DO NOT shower/wash with your normal soap after using and rinsing off the CHG Soap.  Pat yourself dry with a CLEAN TOWEL.  Wear CLEAN PAJAMAS to bed the night before surgery  Place CLEAN SHEETS on your bed the night before your surgery  DO NOT SLEEP WITH PETS.   Day of Surgery: Take a shower with CHG soap. Do not wear jewelry  Do not wear lotions, powders, colognes, or deodorant. Men may shave face and neck. Do not bring valuables to the hospital. [facility] is not responsible for any belongings or valuables.  Wear Clean/Comfortable clothing the morning of surgery Remember to brush your teeth WITH YOUR REGULAR TOOTHPASTE.   Please read over the following fact sheets that you were given.    If you received a COVID test during your pre-op visit  it is requested that you wear a mask when out in public, stay away from anyone that may not be feeling well and notify your surgeon if you develop symptoms. If you have been in contact with anyone that has tested positive in the last 10 days please notify you surgeon.

## 2022-06-20 ENCOUNTER — Encounter (HOSPITAL_COMMUNITY): Payer: Self-pay

## 2022-06-20 ENCOUNTER — Encounter (HOSPITAL_COMMUNITY)
Admission: RE | Admit: 2022-06-20 | Discharge: 2022-06-20 | Disposition: A | Discharge status: Home / Self Care | Payer: Medicare HMO | Source: Ambulatory Visit | Attending: Orthopaedic Surgery | Admitting: Orthopaedic Surgery

## 2022-06-20 ENCOUNTER — Other Ambulatory Visit: Payer: Self-pay

## 2022-06-20 VITALS — BP 180/110 | HR 88 | Temp 98.4°F | Resp 17 | Ht 71.0 in | Wt 201.1 lb

## 2022-06-20 DIAGNOSIS — M1612 Unilateral primary osteoarthritis, left hip: Secondary | ICD-10-CM | POA: Diagnosis not present

## 2022-06-20 DIAGNOSIS — I8500 Esophageal varices without bleeding: Secondary | ICD-10-CM | POA: Insufficient documentation

## 2022-06-20 DIAGNOSIS — Z96652 Presence of left artificial knee joint: Secondary | ICD-10-CM | POA: Diagnosis not present

## 2022-06-20 DIAGNOSIS — I10 Essential (primary) hypertension: Secondary | ICD-10-CM | POA: Diagnosis not present

## 2022-06-20 DIAGNOSIS — Z01818 Encounter for other preprocedural examination: Secondary | ICD-10-CM | POA: Insufficient documentation

## 2022-06-20 DIAGNOSIS — D696 Thrombocytopenia, unspecified: Secondary | ICD-10-CM | POA: Diagnosis not present

## 2022-06-20 DIAGNOSIS — K7460 Unspecified cirrhosis of liver: Secondary | ICD-10-CM | POA: Diagnosis not present

## 2022-06-20 DIAGNOSIS — K766 Portal hypertension: Secondary | ICD-10-CM | POA: Insufficient documentation

## 2022-06-20 LAB — CBC
HCT: 42.8 % (ref 39.0–52.0)
Hemoglobin: 14.6 g/dL (ref 13.0–17.0)
MCH: 32.7 pg (ref 26.0–34.0)
MCHC: 34.1 g/dL (ref 30.0–36.0)
MCV: 95.7 fL (ref 80.0–100.0)
Platelets: 173 10*3/uL (ref 150–400)
RBC: 4.47 MIL/uL (ref 4.22–5.81)
RDW: 12.6 % (ref 11.5–15.5)
WBC: 5.5 10*3/uL (ref 4.0–10.5)
nRBC: 0 % (ref 0.0–0.2)

## 2022-06-20 LAB — SURGICAL PCR SCREEN
MRSA, PCR: NEGATIVE
Staphylococcus aureus: NEGATIVE

## 2022-06-20 LAB — COMPREHENSIVE METABOLIC PANEL
ALT: 18 U/L (ref 0–44)
AST: 21 U/L (ref 15–41)
Albumin: 4.2 g/dL (ref 3.5–5.0)
Alkaline Phosphatase: 73 U/L (ref 38–126)
Anion gap: 9 (ref 5–15)
BUN: 10 mg/dL (ref 8–23)
CO2: 28 mmol/L (ref 22–32)
Calcium: 9.2 mg/dL (ref 8.9–10.3)
Chloride: 99 mmol/L (ref 98–111)
Creatinine, Ser: 0.72 mg/dL (ref 0.61–1.24)
GFR, Estimated: >60 mL/min (ref >60)
Glucose, Bld: 96 mg/dL (ref 70–99)
Potassium: 3.5 mmol/L (ref 3.5–5.1)
Sodium: 136 mmol/L (ref 135–145)
Total Bilirubin: 0.7 mg/dL (ref 0.3–1.2)
Total Protein: 7.3 g/dL (ref 6.5–8.1)

## 2022-06-20 NOTE — Progress Notes (Signed)
PCP - Faustino Congress, NP Cardiologist - denies  PPM/ICD - n/a  Chest x-ray - n/a EKG - 07/15/21 Stress Test - denies ECHO - denies Cardiac Cath - denies  Sleep Study - denies CPAP - denies  Blood Thinner Instructions: n/a Aspirin Instructions: n/a  ERAS Protcol -Clear liquids until 0700 DOS PRE-SURGERY Ensure or G2- Ensure provided.  COVID TEST- n/a  Anesthesia review: Yes, HTN during PAT visit.  BP 200/111 on dinamap, 180/120 manually at the start of appt. Pt states that he forgot to take his medications this morning in preparation to getting to appt on time ([place] lives in [place] Pt states that he monitors his BP at home every other day. Yesterdays reading was 122/82. Pt states that his readings get this high when doesn't take his meds. Re-check at the end of appt was 209/114 on dinamap and 180/110 manually. Pt offered to go to the ER for tx/management but pt declined. Pt stated "I'm going home to take my meds." Pt states that he understands the risks of having a BP that high. Myra Gianotti, PA-C aware of situation and provided guidance throughout appt.   Patient denies shortness of breath, fever, cough and chest pain at PAT appointment   All instructions explained to the patient, with a verbal understanding of the material. Patient agrees to go over the instructions while at home for a better understanding. Patient also instructed to self quarantine after being tested for COVID-19. The opportunity to ask questions was provided.
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
## 2022-06-20 NOTE — Anesthesia Preprocedure Evaluation (Addendum)
Anesthesia Evaluation  Patient identified by MRN, date of birth, ID band Patient awake    Reviewed: Allergy & Precautions, NPO status , Patient's Chart, lab work & pertinent test results  History of Anesthesia Complications Negative for: history of anesthetic complications  Airway Mallampati: II  TM Distance: >3 FB Neck ROM: Full    Dental  (+) Dental Advisory Given, Chipped   Pulmonary neg pulmonary ROS,    Pulmonary exam normal        Cardiovascular hypertension, Pt. on medications Normal cardiovascular exam     Neuro/Psych negative neurological ROS  negative psych ROS   GI/Hepatic negative GI ROS, (+) Cirrhosis   Esophageal Varices  substance abuse  alcohol use, Hepatitis -, C  Endo/Other  negative endocrine ROS  Renal/GU negative Renal ROS     Musculoskeletal  (+) Arthritis , Osteoarthritis,    Abdominal   Peds  Hematology  Plt 173k    Anesthesia Other Findings   Reproductive/Obstetrics                           Anesthesia Physical Anesthesia Plan  ASA: 3  Anesthesia Plan: General   Post-op Pain Management: Ketamine IV*   Induction: Intravenous  PONV Risk Score and Plan: 2 and Treatment may vary due to age or medical condition, Ondansetron and Dexamethasone  Airway Management Planned: Oral ETT  Additional Equipment: None  Intra-op Plan:   Post-operative Plan: Extubation in OR  Informed Consent: I have reviewed the patients History and Physical, chart, labs and discussed the procedure including the risks, benefits and alternatives for the proposed anesthesia with the patient or authorized representative who has indicated his/her understanding and acceptance.     Dental advisory given  Plan Discussed with: CRNA and Anesthesiologist  Anesthesia Plan Comments: (Patient refused spinal due to "bad experiences with previous spinals")      Anesthesia Quick  Evaluation

## 2022-06-21 ENCOUNTER — Other Ambulatory Visit: Payer: Self-pay | Admitting: Physician Assistant

## 2022-06-21 MED ORDER — OXYCODONE-ACETAMINOPHEN 5-325 MG PO TABS: 1.0000 | ORAL_TABLET | Freq: Four times a day (QID) | ORAL | 0 refills | Status: DC | PRN

## 2022-06-21 MED ORDER — DOCUSATE SODIUM 100 MG PO CAPS: 100.0000 mg | ORAL_CAPSULE | Freq: Every day | ORAL | 2 refills | Status: AC | PRN

## 2022-06-21 MED ORDER — ONDANSETRON HCL 4 MG PO TABS: 4.0000 mg | ORAL_TABLET | Freq: Three times a day (TID) | ORAL | 0 refills | Status: DC | PRN

## 2022-06-21 MED ORDER — ASPIRIN 81 MG PO TBEC: 81.0000 mg | DELAYED_RELEASE_TABLET | Freq: Two times a day (BID) | ORAL | 0 refills | Status: AC

## 2022-06-21 MED ORDER — METHOCARBAMOL 750 MG PO TABS: 750.0000 mg | ORAL_TABLET | Freq: Two times a day (BID) | ORAL | 2 refills | Status: DC | PRN

## 2022-06-24 ENCOUNTER — Telehealth: Payer: Self-pay | Admitting: *Deleted

## 2022-06-24 MED ORDER — TRANEXAMIC ACID 1000 MG/10ML IV SOLN
2000.0000 mg | INTRAVENOUS | Status: DC
  Filled 2022-06-24: qty 20

## 2022-06-24 NOTE — Telephone Encounter (Signed)
Ortho bundle pre-op call completed. 

## 2022-06-24 NOTE — Care Plan (Signed)
[facility] RNCM call to patient to discuss his upcoming Left total hip arthroplasty with Dr. Erlinda Hong on Monday, 06/27/22. He is an Ortho bundle patient through [facility] and is agreeable to case management. He will have assistance at home after surgery. He has all DME needed. Anticipate HHPT will be needed after a short hospital stay. Referral made to [facility], who does have an agency in the [place] area that patient lives in. They saw him for his previous knee surgery as well. Choice provided to patient. Reviewed all post op care instructions. Will continue to follow for needs.

## 2022-06-26 DIAGNOSIS — M1612 Unilateral primary osteoarthritis, left hip: Secondary | ICD-10-CM

## 2022-06-27 ENCOUNTER — Encounter (HOSPITAL_COMMUNITY)
Admission: RE | Disposition: A | Discharge status: Home / Self Care | Payer: Self-pay | Source: Ambulatory Visit | Attending: Orthopaedic Surgery

## 2022-06-27 ENCOUNTER — Ambulatory Visit (HOSPITAL_COMMUNITY): Payer: Medicare HMO | Admitting: Vascular Surgery

## 2022-06-27 ENCOUNTER — Observation Stay (HOSPITAL_COMMUNITY)
Admission: RE | Admit: 2022-06-27 | Discharge: 2022-06-28 | Disposition: A | Discharge status: Home / Self Care | Payer: Medicare HMO | Source: Ambulatory Visit | Attending: Orthopaedic Surgery | Admitting: Orthopaedic Surgery

## 2022-06-27 ENCOUNTER — Other Ambulatory Visit: Payer: Self-pay

## 2022-06-27 ENCOUNTER — Ambulatory Visit (HOSPITAL_BASED_OUTPATIENT_CLINIC_OR_DEPARTMENT_OTHER): Payer: Medicare HMO | Admitting: Anesthesiology

## 2022-06-27 ENCOUNTER — Ambulatory Visit (HOSPITAL_COMMUNITY): Payer: Medicare HMO

## 2022-06-27 ENCOUNTER — Observation Stay (HOSPITAL_COMMUNITY): Payer: Medicare HMO

## 2022-06-27 ENCOUNTER — Encounter (HOSPITAL_COMMUNITY): Payer: Self-pay | Admitting: Orthopaedic Surgery

## 2022-06-27 DIAGNOSIS — Z96652 Presence of left artificial knee joint: Secondary | ICD-10-CM | POA: Insufficient documentation

## 2022-06-27 DIAGNOSIS — I8510 Secondary esophageal varices without bleeding: Secondary | ICD-10-CM

## 2022-06-27 DIAGNOSIS — Z79899 Other long term (current) drug therapy: Secondary | ICD-10-CM | POA: Diagnosis not present

## 2022-06-27 DIAGNOSIS — B1920 Unspecified viral hepatitis C without hepatic coma: Secondary | ICD-10-CM

## 2022-06-27 DIAGNOSIS — Z87891 Personal history of nicotine dependence: Secondary | ICD-10-CM | POA: Diagnosis not present

## 2022-06-27 DIAGNOSIS — Z471 Aftercare following joint replacement surgery: Secondary | ICD-10-CM | POA: Diagnosis not present

## 2022-06-27 DIAGNOSIS — Z7982 Long term (current) use of aspirin: Secondary | ICD-10-CM | POA: Insufficient documentation

## 2022-06-27 DIAGNOSIS — Z96641 Presence of right artificial hip joint: Secondary | ICD-10-CM | POA: Insufficient documentation

## 2022-06-27 DIAGNOSIS — F1090 Alcohol use, unspecified, uncomplicated: Secondary | ICD-10-CM

## 2022-06-27 DIAGNOSIS — R600 Localized edema: Secondary | ICD-10-CM | POA: Diagnosis not present

## 2022-06-27 DIAGNOSIS — M1612 Unilateral primary osteoarthritis, left hip: Secondary | ICD-10-CM | POA: Diagnosis not present

## 2022-06-27 DIAGNOSIS — I10 Essential (primary) hypertension: Secondary | ICD-10-CM | POA: Insufficient documentation

## 2022-06-27 DIAGNOSIS — K7460 Unspecified cirrhosis of liver: Secondary | ICD-10-CM

## 2022-06-27 DIAGNOSIS — Z96642 Presence of left artificial hip joint: Secondary | ICD-10-CM

## 2022-06-27 HISTORY — PX: TOTAL HIP ARTHROPLASTY: SHX124

## 2022-06-27 LAB — TYPE AND SCREEN
ABO/RH(D): B POS
Antibody Screen: NEGATIVE

## 2022-06-27 SURGERY — ARTHROPLASTY, HIP, TOTAL, ANTERIOR APPROACH
Anesthesia: General | Site: Hip | Laterality: Left

## 2022-06-27 MED ORDER — PRONTOSAN WOUND IRRIGATION OPTIME
TOPICAL | Status: DC | PRN
  Administered 2022-06-27: 1

## 2022-06-27 MED ORDER — ORAL CARE MOUTH RINSE: 15.0000 mL | Freq: Once | OROMUCOSAL | Status: AC

## 2022-06-27 MED ORDER — EPHEDRINE 5 MG/ML INJ
INTRAVENOUS | Status: AC
  Filled 2022-06-27: qty 5

## 2022-06-27 MED ORDER — CEFAZOLIN SODIUM-DEXTROSE 2-4 GM/100ML-% IV SOLN
2.0000 g | INTRAVENOUS | Status: AC
  Administered 2022-06-27: 2 g via INTRAVENOUS
  Filled 2022-06-27: qty 100

## 2022-06-27 MED ORDER — TRANEXAMIC ACID 1000 MG/10ML IV SOLN
INTRAVENOUS | Status: DC | PRN
  Administered 2022-06-27: 2000 mg via TOPICAL

## 2022-06-27 MED ORDER — ROCURONIUM BROMIDE 10 MG/ML (PF) SYRINGE
PREFILLED_SYRINGE | INTRAVENOUS | Status: AC
  Filled 2022-06-27: qty 10

## 2022-06-27 MED ORDER — LISINOPRIL 10 MG PO TABS
5.0000 mg | ORAL_TABLET | Freq: Every morning | ORAL | Status: DC
  Administered 2022-06-27 – 2022-06-28 (×2): 5 mg via ORAL
  Filled 2022-06-27 (×2): qty 1

## 2022-06-27 MED ORDER — HYDROMORPHONE HCL 1 MG/ML IJ SOLN
INTRAMUSCULAR | Status: AC
  Filled 2022-06-27: qty 1

## 2022-06-27 MED ORDER — METHOCARBAMOL 1000 MG/10ML IJ SOLN: 500.0000 mg | Freq: Four times a day (QID) | INTRAVENOUS | Status: DC | PRN

## 2022-06-27 MED ORDER — ONDANSETRON HCL 4 MG/2ML IJ SOLN
INTRAMUSCULAR | Status: DC | PRN
  Administered 2022-06-27: 4 mg via INTRAVENOUS

## 2022-06-27 MED ORDER — VANCOMYCIN HCL 1000 MG IV SOLR
INTRAVENOUS | Status: AC
  Filled 2022-06-27: qty 20

## 2022-06-27 MED ORDER — CEFAZOLIN SODIUM-DEXTROSE 2-4 GM/100ML-% IV SOLN
2.0000 g | Freq: Four times a day (QID) | INTRAVENOUS | Status: AC
  Administered 2022-06-27 (×2): 2 g via INTRAVENOUS
  Filled 2022-06-27 (×2): qty 100

## 2022-06-27 MED ORDER — OXYCODONE HCL ER 10 MG PO T12A
10.0000 mg | EXTENDED_RELEASE_TABLET | Freq: Two times a day (BID) | ORAL | Status: DC
  Administered 2022-06-27 – 2022-06-28 (×3): 10 mg via ORAL
  Filled 2022-06-27 (×3): qty 1

## 2022-06-27 MED ORDER — CHLORHEXIDINE GLUCONATE 0.12 % MT SOLN
15.0000 mL | Freq: Once | OROMUCOSAL | Status: AC
  Administered 2022-06-27: 15 mL via OROMUCOSAL
  Filled 2022-06-27: qty 15

## 2022-06-27 MED ORDER — DEXAMETHASONE SODIUM PHOSPHATE 10 MG/ML IJ SOLN
INTRAMUSCULAR | Status: AC
  Filled 2022-06-27: qty 1

## 2022-06-27 MED ORDER — PROPOFOL 10 MG/ML IV BOLUS
INTRAVENOUS | Status: DC | PRN
  Administered 2022-06-27: 120 mg via INTRAVENOUS

## 2022-06-27 MED ORDER — OXYCODONE HCL 5 MG/5ML PO SOLN: 5.0000 mg | Freq: Once | ORAL | Status: DC | PRN

## 2022-06-27 MED ORDER — MIDAZOLAM HCL 2 MG/2ML IJ SOLN
INTRAMUSCULAR | Status: AC
  Filled 2022-06-27: qty 2

## 2022-06-27 MED ORDER — OXYCODONE HCL 5 MG PO TABS: 5.0000 mg | ORAL_TABLET | Freq: Once | ORAL | Status: DC | PRN

## 2022-06-27 MED ORDER — MAGNESIUM CITRATE PO SOLN: 1.0000 | Freq: Once | ORAL | Status: DC | PRN

## 2022-06-27 MED ORDER — LACTATED RINGERS IV SOLN: INTRAVENOUS | Status: DC

## 2022-06-27 MED ORDER — PHENYLEPHRINE 80 MCG/ML (10ML) SYRINGE FOR IV PUSH (FOR BLOOD PRESSURE SUPPORT): PREFILLED_SYRINGE | INTRAVENOUS | Status: DC | PRN

## 2022-06-27 MED ORDER — LIDOCAINE 2% (20 MG/ML) 5 ML SYRINGE
INTRAMUSCULAR | Status: DC | PRN
  Administered 2022-06-27: 60 mg via INTRAVENOUS

## 2022-06-27 MED ORDER — PANTOPRAZOLE SODIUM 40 MG PO TBEC
40.0000 mg | DELAYED_RELEASE_TABLET | Freq: Every day | ORAL | Status: DC
  Administered 2022-06-27 – 2022-06-28 (×2): 40 mg via ORAL
  Filled 2022-06-27 (×2): qty 1

## 2022-06-27 MED ORDER — SODIUM CHLORIDE 0.9 % IR SOLN
Status: DC | PRN
  Administered 2022-06-27: 1000 mL

## 2022-06-27 MED ORDER — FENTANYL CITRATE (PF) 250 MCG/5ML IJ SOLN
INTRAMUSCULAR | Status: AC
  Filled 2022-06-27: qty 5

## 2022-06-27 MED ORDER — PHENYLEPHRINE HCL-NACL 20-0.9 MG/250ML-% IV SOLN
INTRAVENOUS | Status: DC | PRN
  Administered 2022-06-27: 40 ug/min via INTRAVENOUS

## 2022-06-27 MED ORDER — METOCLOPRAMIDE HCL 5 MG PO TABS: 5.0000 mg | ORAL_TABLET | Freq: Three times a day (TID) | ORAL | Status: DC | PRN

## 2022-06-27 MED ORDER — TRANEXAMIC ACID-NACL 1000-0.7 MG/100ML-% IV SOLN
1000.0000 mg | Freq: Once | INTRAVENOUS | Status: AC
  Administered 2022-06-27: 1000 mg via INTRAVENOUS
  Filled 2022-06-27: qty 100

## 2022-06-27 MED ORDER — FENTANYL CITRATE (PF) 100 MCG/2ML IJ SOLN
25.0000 ug | INTRAMUSCULAR | Status: DC | PRN
  Administered 2022-06-27 (×3): 50 ug via INTRAVENOUS

## 2022-06-27 MED ORDER — TRANEXAMIC ACID-NACL 1000-0.7 MG/100ML-% IV SOLN
1000.0000 mg | INTRAVENOUS | Status: AC
  Administered 2022-06-27: 1000 mg via INTRAVENOUS
  Filled 2022-06-27: qty 100

## 2022-06-27 MED ORDER — ASPIRIN 81 MG PO CHEW
81.0000 mg | CHEWABLE_TABLET | Freq: Two times a day (BID) | ORAL | Status: DC
  Administered 2022-06-27 – 2022-06-28 (×2): 81 mg via ORAL
  Filled 2022-06-27 (×2): qty 1

## 2022-06-27 MED ORDER — POLYETHYLENE GLYCOL 3350 17 G PO PACK
17.0000 g | PACK | Freq: Every day | ORAL | Status: DC
  Administered 2022-06-27 – 2022-06-28 (×2): 17 g via ORAL
  Filled 2022-06-27 (×2): qty 1

## 2022-06-27 MED ORDER — DEXAMETHASONE SODIUM PHOSPHATE 10 MG/ML IJ SOLN
10.0000 mg | Freq: Once | INTRAMUSCULAR | Status: AC
  Administered 2022-06-28: 10 mg via INTRAVENOUS
  Filled 2022-06-27: qty 1

## 2022-06-27 MED ORDER — MENTHOL 3 MG MT LOZG: 1.0000 | LOZENGE | OROMUCOSAL | Status: DC | PRN

## 2022-06-27 MED ORDER — ROCURONIUM BROMIDE 10 MG/ML (PF) SYRINGE
PREFILLED_SYRINGE | INTRAVENOUS | Status: DC | PRN
  Administered 2022-06-27: 50 mg via INTRAVENOUS

## 2022-06-27 MED ORDER — CLONIDINE HCL 0.1 MG PO TABS
0.1000 mg | ORAL_TABLET | Freq: Two times a day (BID) | ORAL | Status: DC
  Administered 2022-06-27 – 2022-06-28 (×2): 0.1 mg via ORAL
  Filled 2022-06-27 (×2): qty 1

## 2022-06-27 MED ORDER — ACETAMINOPHEN 325 MG PO TABS
325.0000 mg | ORAL_TABLET | Freq: Four times a day (QID) | ORAL | Status: DC | PRN
  Administered 2022-06-28: 650 mg via ORAL
  Filled 2022-06-27: qty 2

## 2022-06-27 MED ORDER — DOCUSATE SODIUM 100 MG PO CAPS
100.0000 mg | ORAL_CAPSULE | Freq: Two times a day (BID) | ORAL | Status: DC
  Administered 2022-06-27 – 2022-06-28 (×3): 100 mg via ORAL
  Filled 2022-06-27 (×3): qty 1

## 2022-06-27 MED ORDER — ONDANSETRON HCL 4 MG/2ML IJ SOLN: 4.0000 mg | Freq: Four times a day (QID) | INTRAMUSCULAR | Status: DC | PRN

## 2022-06-27 MED ORDER — LIDOCAINE 2% (20 MG/ML) 5 ML SYRINGE
INTRAMUSCULAR | Status: AC
  Filled 2022-06-27: qty 5

## 2022-06-27 MED ORDER — FENTANYL CITRATE (PF) 100 MCG/2ML IJ SOLN
INTRAMUSCULAR | Status: AC
  Filled 2022-06-27: qty 2

## 2022-06-27 MED ORDER — PHENOL 1.4 % MT LIQD: 1.0000 | OROMUCOSAL | Status: DC | PRN

## 2022-06-27 MED ORDER — METOCLOPRAMIDE HCL 5 MG/ML IJ SOLN: 5.0000 mg | Freq: Three times a day (TID) | INTRAMUSCULAR | Status: DC | PRN

## 2022-06-27 MED ORDER — KETAMINE HCL 50 MG/5ML IJ SOSY
PREFILLED_SYRINGE | INTRAMUSCULAR | Status: AC
  Filled 2022-06-27: qty 5

## 2022-06-27 MED ORDER — PROPOFOL 10 MG/ML IV BOLUS
INTRAVENOUS | Status: AC
  Filled 2022-06-27: qty 20

## 2022-06-27 MED ORDER — OXYCODONE HCL 5 MG PO TABS
5.0000 mg | ORAL_TABLET | ORAL | Status: DC | PRN
  Administered 2022-06-27 – 2022-06-28 (×5): 10 mg via ORAL
  Filled 2022-06-27 (×5): qty 2

## 2022-06-27 MED ORDER — HYDROMORPHONE HCL 1 MG/ML IJ SOLN: 0.5000 mg | INTRAMUSCULAR | Status: DC | PRN

## 2022-06-27 MED ORDER — MIDAZOLAM HCL 5 MG/5ML IJ SOLN
INTRAMUSCULAR | Status: DC | PRN
  Administered 2022-06-27: 2 mg via INTRAVENOUS

## 2022-06-27 MED ORDER — SUGAMMADEX SODIUM 200 MG/2ML IV SOLN
INTRAVENOUS | Status: DC | PRN
  Administered 2022-06-27: 200 mg via INTRAVENOUS

## 2022-06-27 MED ORDER — METHOCARBAMOL 500 MG PO TABS
500.0000 mg | ORAL_TABLET | Freq: Four times a day (QID) | ORAL | Status: DC | PRN
  Administered 2022-06-27 – 2022-06-28 (×3): 500 mg via ORAL
  Filled 2022-06-27 (×3): qty 1

## 2022-06-27 MED ORDER — ONDANSETRON HCL 4 MG PO TABS: 4.0000 mg | ORAL_TABLET | Freq: Four times a day (QID) | ORAL | Status: DC | PRN

## 2022-06-27 MED ORDER — ACETAMINOPHEN 500 MG PO TABS
1000.0000 mg | ORAL_TABLET | Freq: Four times a day (QID) | ORAL | Status: AC
  Administered 2022-06-27 – 2022-06-28 (×4): 1000 mg via ORAL
  Filled 2022-06-27 (×4): qty 2

## 2022-06-27 MED ORDER — BUPIVACAINE-MELOXICAM ER 400-12 MG/14ML IJ SOLN
INTRAMUSCULAR | Status: DC | PRN
  Administered 2022-06-27: 400 mg

## 2022-06-27 MED ORDER — 0.9 % SODIUM CHLORIDE (POUR BTL) OPTIME
TOPICAL | Status: DC | PRN
  Administered 2022-06-27: 1000 mL

## 2022-06-27 MED ORDER — ONDANSETRON HCL 4 MG/2ML IJ SOLN: 4.0000 mg | Freq: Once | INTRAMUSCULAR | Status: DC | PRN

## 2022-06-27 MED ORDER — FERROUS SULFATE 325 (65 FE) MG PO TABS
325.0000 mg | ORAL_TABLET | Freq: Three times a day (TID) | ORAL | Status: DC
  Administered 2022-06-27 – 2022-06-28 (×3): 325 mg via ORAL
  Filled 2022-06-27 (×3): qty 1

## 2022-06-27 MED ORDER — HYDROMORPHONE HCL 1 MG/ML IJ SOLN
0.2500 mg | INTRAMUSCULAR | Status: DC | PRN
  Administered 2022-06-27: 0.5 mg via INTRAVENOUS

## 2022-06-27 MED ORDER — HYDROMORPHONE HCL 1 MG/ML IJ SOLN
0.2500 mg | INTRAMUSCULAR | Status: DC | PRN
  Administered 2022-06-27 (×4): 0.5 mg via INTRAVENOUS

## 2022-06-27 MED ORDER — OXYCODONE HCL 5 MG PO TABS
10.0000 mg | ORAL_TABLET | ORAL | Status: DC | PRN
  Administered 2022-06-27: 10 mg via ORAL
  Filled 2022-06-27: qty 2

## 2022-06-27 MED ORDER — ALUM & MAG HYDROXIDE-SIMETH 200-200-20 MG/5ML PO SUSP
30.0000 mL | ORAL | Status: DC | PRN
Start: 2022-06-27 — End: 2022-06-28

## 2022-06-27 MED ORDER — ONDANSETRON HCL 4 MG/2ML IJ SOLN
INTRAMUSCULAR | Status: AC
  Filled 2022-06-27: qty 2

## 2022-06-27 MED ORDER — FENTANYL CITRATE (PF) 250 MCG/5ML IJ SOLN
INTRAMUSCULAR | Status: DC | PRN
Start: 2022-06-27 — End: 2022-06-27
  Administered 2022-06-27: 100 ug via INTRAVENOUS
  Administered 2022-06-27 (×3): 50 ug via INTRAVENOUS

## 2022-06-27 MED ORDER — DIPHENHYDRAMINE HCL 12.5 MG/5ML PO ELIX: 25.0000 mg | ORAL_SOLUTION | ORAL | Status: DC | PRN

## 2022-06-27 MED ORDER — SODIUM CHLORIDE 0.9 % IV SOLN: INTRAVENOUS | Status: DC

## 2022-06-27 MED ORDER — DEXAMETHASONE SODIUM PHOSPHATE 10 MG/ML IJ SOLN
INTRAMUSCULAR | Status: DC | PRN
  Administered 2022-06-27: 10 mg via INTRAVENOUS

## 2022-06-27 MED ORDER — SORBITOL 70 % SOLN: 30.0000 mL | Freq: Every day | Status: DC | PRN

## 2022-06-27 MED ORDER — BUPIVACAINE-MELOXICAM ER 400-12 MG/14ML IJ SOLN
INTRAMUSCULAR | Status: AC
  Filled 2022-06-27: qty 1

## 2022-06-27 MED ORDER — POVIDONE-IODINE 10 % EX SWAB
2.0000 | Freq: Once | CUTANEOUS | Status: AC
  Administered 2022-06-27: 2 via TOPICAL

## 2022-06-27 MED ORDER — VANCOMYCIN HCL 1 G IV SOLR
INTRAVENOUS | Status: DC | PRN
  Administered 2022-06-27: 1000 mg via TOPICAL

## 2022-06-27 MED ORDER — KETAMINE HCL 10 MG/ML IJ SOLN
INTRAMUSCULAR | Status: DC | PRN
  Administered 2022-06-27 (×2): 10 mg via INTRAVENOUS
  Administered 2022-06-27: 30 mg via INTRAVENOUS

## 2022-06-27 SURGICAL SUPPLY — 76 items
ACETAB CUP W/GRIPTION 54 (Plate) ×1 IMPLANT
ADH SKN CLS APL DERMABOND .7 (GAUZE/BANDAGES/DRESSINGS) ×1
BAG COUNTER SPONGE SURGICOUNT (BAG) ×2 IMPLANT
BAG DECANTER FOR FLEXI CONT (MISCELLANEOUS) ×2 IMPLANT
BAG SPNG CNTER NS LX DISP (BAG) ×1
CELLS DAT CNTRL 66122 CELL SVR (MISCELLANEOUS) IMPLANT
CLOSURE STERI STRIP 1/2 X4 (GAUZE/BANDAGES/DRESSINGS) IMPLANT
COVER PERINEAL POST (MISCELLANEOUS) ×2 IMPLANT
COVER SURGICAL LIGHT HANDLE (MISCELLANEOUS) ×2 IMPLANT
CUP ACETAB W/GRIPTION 54 (Plate) IMPLANT
DERMABOND ADVANCED (GAUZE/BANDAGES/DRESSINGS) ×1
DERMABOND ADVANCED .7 DNX12 (GAUZE/BANDAGES/DRESSINGS) IMPLANT
DRAPE C-ARM 42X72 X-RAY (DRAPES) ×2 IMPLANT
DRAPE POUCH INSTRU U-SHP 10X18 (DRAPES) ×2 IMPLANT
DRAPE STERI IOBAN 125X83 (DRAPES) ×2 IMPLANT
DRAPE U-SHAPE 47X51 STRL (DRAPES) ×4 IMPLANT
DRESSING AQUACEL AG SP 3.5X10 (GAUZE/BANDAGES/DRESSINGS) IMPLANT
DRSG AQUACEL AG ADV 3.5X10 (GAUZE/BANDAGES/DRESSINGS) ×2 IMPLANT
DRSG AQUACEL AG SP 3.5X10 (GAUZE/BANDAGES/DRESSINGS) ×1
DURAPREP 26ML APPLICATOR (WOUND CARE) ×4 IMPLANT
ELECT BLADE 4.0 EZ CLEAN MEGAD (MISCELLANEOUS) ×1
ELECT REM PT RETURN 9FT ADLT (ELECTROSURGICAL) ×1
ELECTRODE BLDE 4.0 EZ CLN MEGD (MISCELLANEOUS) ×2 IMPLANT
ELECTRODE REM PT RTRN 9FT ADLT (ELECTROSURGICAL) ×2 IMPLANT
GLOVE BIOGEL PI IND STRL 7.0 (GLOVE) ×4 IMPLANT
GLOVE BIOGEL PI IND STRL 7.5 (GLOVE) ×10 IMPLANT
GLOVE BIOGEL PI INDICATOR 7.0 (GLOVE) ×2
GLOVE BIOGEL PI INDICATOR 7.5 (GLOVE) ×5
GLOVE ECLIPSE 7.0 STRL STRAW (GLOVE) ×4 IMPLANT
GLOVE SKINSENSE NS SZ7.5 (GLOVE) ×1
GLOVE SKINSENSE STRL SZ7.5 (GLOVE) ×2 IMPLANT
GLOVE SURG SYN 7.5  E (GLOVE) ×2
GLOVE SURG SYN 7.5 E (GLOVE) ×2 IMPLANT
GLOVE SURG SYN 7.5 PF PI (GLOVE) ×4 IMPLANT
GLOVE SURG UNDER POLY LF SZ7 (GLOVE) ×2 IMPLANT
GLOVE SURG UNDER POLY LF SZ7.5 (GLOVE) ×4 IMPLANT
GOWN STRL REIN XL XLG (GOWN DISPOSABLE) ×2 IMPLANT
GOWN STRL REUS W/ TWL LRG LVL3 (GOWN DISPOSABLE) IMPLANT
GOWN STRL REUS W/ TWL XL LVL3 (GOWN DISPOSABLE) ×2 IMPLANT
GOWN STRL REUS W/TWL LRG LVL3 (GOWN DISPOSABLE)
GOWN STRL REUS W/TWL XL LVL3 (GOWN DISPOSABLE) ×1
HANDPIECE INTERPULSE COAX TIP (DISPOSABLE) ×1
HEAD CERAMIC DELTA 36 PLUS 1.5 (Hips) IMPLANT
HOOD PEEL AWAY FLYTE STAYCOOL (MISCELLANEOUS) ×4 IMPLANT
IV NS IRRIG 3000ML ARTHROMATIC (IV SOLUTION) ×2 IMPLANT
JET LAVAGE IRRISEPT WOUND (IRRIGATION / IRRIGATOR)
KIT BASIN OR (CUSTOM PROCEDURE TRAY) ×2 IMPLANT
LAVAGE JET IRRISEPT WOUND (IRRIGATION / IRRIGATOR) ×2 IMPLANT
LINER NEUTRAL 54X36MM PLUS 4 (Hips) IMPLANT
MARKER SKIN DUAL TIP RULER LAB (MISCELLANEOUS) ×2 IMPLANT
NDL SPNL 18GX3.5 QUINCKE PK (NEEDLE) ×2 IMPLANT
NEEDLE SPNL 18GX3.5 QUINCKE PK (NEEDLE) ×1 IMPLANT
PACK TOTAL JOINT (CUSTOM PROCEDURE TRAY) ×2 IMPLANT
PACK UNIVERSAL I (CUSTOM PROCEDURE TRAY) ×2 IMPLANT
RETRACTOR WND ALEXIS 18 MED (MISCELLANEOUS) IMPLANT
RTRCTR WOUND ALEXIS 18CM MED (MISCELLANEOUS)
SAW OSC TIP CART 19.5X105X1.3 (SAW) ×2 IMPLANT
SCREW 6.5MMX25MM (Screw) IMPLANT
SET HNDPC FAN SPRY TIP SCT (DISPOSABLE) ×2 IMPLANT
SOLUTION PRONTOSAN WOUND 350ML (IRRIGATION / IRRIGATOR) IMPLANT
STAPLER VISISTAT 35W (STAPLE) IMPLANT
STEM FEMORAL SZ6 HIGH ACTIS (Stem) IMPLANT
SUT ETHIBOND 2 V 37 (SUTURE) ×2 IMPLANT
SUT ETHILON 2 0 FS 18 (SUTURE) IMPLANT
SUT VIC AB 0 CT1 27 (SUTURE) ×1
SUT VIC AB 0 CT1 27XBRD ANBCTR (SUTURE) ×2 IMPLANT
SUT VIC AB 1 CTX 36 (SUTURE) ×1
SUT VIC AB 1 CTX36XBRD ANBCTR (SUTURE) ×2 IMPLANT
SUT VIC AB 2-0 CT1 27 (SUTURE) ×2
SUT VIC AB 2-0 CT1 TAPERPNT 27 (SUTURE) ×4 IMPLANT
SYR 50ML LL SCALE MARK (SYRINGE) ×2 IMPLANT
TOWEL GREEN STERILE (TOWEL DISPOSABLE) ×2 IMPLANT
TRAY CATH 16FR W/PLASTIC CATH (SET/KITS/TRAYS/PACK) IMPLANT
TRAY FOLEY W/BAG SLVR 16FR (SET/KITS/TRAYS/PACK)
TRAY FOLEY W/BAG SLVR 16FR ST (SET/KITS/TRAYS/PACK) ×2 IMPLANT
YANKAUER SUCT BULB TIP NO VENT (SUCTIONS) ×2 IMPLANT

## 2022-06-27 NOTE — Anesthesia Postprocedure Evaluation (Signed)
Anesthesia Post Note  Patient: Joshua Burton  Procedure(s) Performed: LEFT TOTAL HIP ARTHROPLASTY ANTERIOR APPROACH (Left: Hip)     Patient location during evaluation: PACU Anesthesia Type: General Level of consciousness: awake and alert Pain management: pain level controlled Vital Signs Assessment: post-procedure vital signs reviewed and stable Respiratory status: spontaneous breathing, nonlabored ventilation and respiratory function stable Cardiovascular status: stable and blood pressure returned to baseline Anesthetic complications: no   No notable events documented.  Last Vitals:  Vitals:   06/27/22 1247 06/27/22 1303  BP: (!) 158/87 (!) 171/102  Pulse: 69 75  Resp: 10 19  Temp:  36.7 C  SpO2: 100% 100%    Last Pain:  Vitals:   06/27/22 1247  TempSrc:   PainSc: [place]

## 2022-06-27 NOTE — Anesthesia Procedure Notes (Signed)
Procedure Name: Intubation Date/Time: 06/27/2022 9:43 AM  Performed by: Kyung Rudd, CRNAPre-anesthesia Checklist: Patient identified, Emergency Drugs available, Suction available and Patient being monitored Patient Re-evaluated:Patient Re-evaluated prior to induction Oxygen Delivery Method: Circle system utilized Preoxygenation: Pre-oxygenation with 100% oxygen Induction Type: IV induction Ventilation: Mask ventilation without difficulty and Oral airway inserted - appropriate to patient size Laryngoscope Size: Mac and 4 Grade View: Grade I Tube type: Oral Tube size: 7.5 mm Number of attempts: 1 Airway Equipment and Method: Stylet Placement Confirmation: ETT inserted through vocal cords under direct vision, positive ETCO2 and breath sounds checked- equal and bilateral Secured at: 22 cm Tube secured with: Tape Dental Injury: Teeth and Oropharynx as per pre-operative assessment

## 2022-06-27 NOTE — Evaluation (Signed)
Physical Therapy Evaluation Patient Details Name: Joshua Burton MRN: [id number] DOB: 31-May-1951 Today's Date: 06/27/2022  History of Present Illness  Pt is a 71 y/o male s/p L THA, direct anterior. PMH includes Hepatitis C, HTN, R THA, L TKA, and alcoholic cirrhosis.  Clinical Impression  Pt admitted secondary to problem above with deficits below. Pt with increased pain which limited mobility to chair. Requiring min guard A for mobility with use of RW. Anticipate pt will progress well as pain is controlled. Will continue to follow acutely.        Recommendations for follow up therapy are one component of a multi-disciplinary discharge planning process, led by the attending physician.  Recommendations may be updated based on patient status, additional functional criteria and insurance authorization.  Follow Up Recommendations Follow physician's recommendations for discharge plan and follow up therapies      Assistance Recommended at Discharge Intermittent Supervision/Assistance  Patient can return home with the following  Assistance with cooking/housework;Assist for transportation;Help with stairs or ramp for entrance    Equipment Recommendations None recommended by PT  Recommendations for Other Services       Functional Status Assessment Patient has had a recent decline in their functional status and demonstrates the ability to make significant improvements in function in a reasonable and predictable amount of time.     Precautions / Restrictions Precautions Precautions: Fall Restrictions Weight Bearing Restrictions: Yes LLE Weight Bearing: Weight bearing as tolerated      Mobility  Bed Mobility Overal bed mobility: Needs Assistance Bed Mobility: Supine to Sit     Supine to sit: Supervision     General bed mobility comments: Supervision for safety. Using UEs to move LLE to EOB.    Transfers Overall transfer level: Needs assistance Equipment used: Rolling walker (2  wheels) Transfers: Sit to/from Stand, Bed to chair/wheelchair/BSC Sit to Stand: Min guard Stand pivot transfers: Min guard         General transfer comment: Min guard for safety to stand and transfer to chair. Very short shuffled steps. Further mobility limited secondary to pain.    Ambulation/Gait                  Stairs            Wheelchair Mobility    Modified Rankin (Stroke Patients Only)       Balance Overall balance assessment: Needs assistance Sitting-balance support: No upper extremity supported, Feet supported Sitting balance-Leahy Scale: Fair     Standing balance support: Bilateral upper extremity supported Standing balance-Leahy Scale: Poor Standing balance comment: Reliant on BUE support                             Pertinent Vitals/Pain Pain Assessment Pain Assessment: Faces Faces Pain Scale: Hurts even more Pain Location: L hip Pain Descriptors / Indicators: Grimacing, Guarding Pain Intervention(s): Limited activity within patient's tolerance, Monitored during session, Repositioned    Home Living Family/patient expects to be discharged to:: Private residence Living Arrangements: Spouse/significant other Available Help at Discharge: Family;Available 24 hours/day Type of Home: House Home Access: Stairs to enter Entrance Stairs-Rails: Left Entrance Stairs-Number of Steps: 1   Home Layout: One level Home Equipment: Conservation officer, nature (2 wheels);Cane - single point;Tub bench;Crutches      Prior Function Prior Level of Function : Independent/Modified Independent  Hand Dominance        Extremity/Trunk Assessment   Upper Extremity Assessment Upper Extremity Assessment: Overall WFL for tasks assessed    Lower Extremity Assessment Lower Extremity Assessment: LLE deficits/detail LLE Deficits / Details: Deficits consistent with post op pain and weakness.    Cervical / Trunk Assessment Cervical /  Trunk Assessment: Normal  Communication   Communication: No difficulties  Cognition Arousal/Alertness: Awake/alert Behavior During Therapy: WFL for tasks assessed/performed Overall Cognitive Status: Within Functional Limits for tasks assessed                                          General Comments      Exercises     Assessment/Plan    PT Assessment Patient needs continued PT services  PT Problem List Decreased strength;Decreased range of motion;Decreased activity tolerance;Decreased balance;Decreased mobility;Decreased knowledge of use of DME;Pain;Decreased knowledge of precautions       PT Treatment Interventions DME instruction;Gait training;Stair training;Functional mobility training;Therapeutic activities;Therapeutic exercise;Balance training;Patient/family education    PT Goals (Current goals can be found in the Care Plan section)  Acute Rehab PT Goals Patient Stated Goal: to go home PT Goal Formulation: With patient Time For Goal Achievement: 07/11/22 Potential to Achieve Goals: Good    Frequency 7X/week     Co-evaluation               AM-PAC PT "6 Clicks" Mobility  Outcome Measure Help needed turning from your back to your side while in a flat bed without using bedrails?: None Help needed moving from lying on your back to sitting on the side of a flat bed without using bedrails?: A Little Help needed moving to and from a bed to a chair (including a wheelchair)?: A Little Help needed standing up from a chair using your arms (e.g., wheelchair or bedside chair)?: A Little Help needed to walk in hospital room?: A Little Help needed climbing 3-5 steps with a railing? : A Lot 6 Click Score: 18    End of Session Equipment Utilized During Treatment: Gait belt Activity Tolerance: Patient limited by pain Patient left: in chair;with call bell/phone within reach Nurse Communication: Mobility status PT Visit Diagnosis: Unsteadiness on feet  (R26.81);Muscle weakness (generalized) (M62.81);Pain Pain - Right/Left: Left Pain - part of body: Hip    Time: 0630-1601 PT Time Calculation (min) (ACUTE ONLY): 16 min   Charges:   PT Evaluation $PT Eval Low Complexity: 1 Low          Joshua Burton, PT, DPT  Acute Rehabilitation Services  Office: [phone]   Joshua Burton 06/27/2022, 2:51 PM

## 2022-06-27 NOTE — Transfer of Care (Signed)
Immediate Anesthesia Transfer of Care Note  Patient: Joshua Burton  Procedure(s) Performed: LEFT TOTAL HIP ARTHROPLASTY ANTERIOR APPROACH (Left: Hip)  Patient Location: PACU  Anesthesia Type:General  Level of Consciousness: awake, alert  and oriented  Airway & Oxygen Therapy: Patient Spontanous Breathing  Post-op Assessment: Report given to RN, Post -op Vital signs reviewed and stable and Patient moving all extremities  Post vital signs: Reviewed and stable  Last Vitals:  Vitals Value Taken Time  BP 167/94 06/27/22 1134  Temp 36.2 C 06/27/22 1135  Pulse 78 06/27/22 1138  Resp 19 06/27/22 1138  SpO2 96 % 06/27/22 1138  Vitals shown include unvalidated device data.  Last Pain:  Vitals:   06/27/22 1135  TempSrc:   PainSc: 10-Worst pain ever         Complications: No notable events documented.

## 2022-06-27 NOTE — Op Note (Signed)
LEFT TOTAL HIP ARTHROPLASTY ANTERIOR APPROACH  Procedure Note Joshua Burton   [id number]  Pre-op Diagnosis: left hip degenerative  joint disease     Post-op Diagnosis: same  Operative Findings Complete loss of cartilage from femoral head and acetabulum, large joint effusion   Operative Procedures  1. Total hip replacement; Left hip; uncemented [id number]   Surgeon: Joshua Burton, M.D.  Assist: Joshua Burton, RNFA   Anesthesia: general  Prosthesis: Depuy Acetabulum: Pinnacle 54 mm Femur: Actis 6 HO Head: 36 mm size: +1.5 Liner: +4 Bearing Type: ceramic/poly  Total Hip Arthroplasty (Anterior Approach) Op Note:  After informed consent was obtained and the operative extremity marked in the holding area, the patient was brought back to the operating room and placed supine on the HANA table. Next, the operative extremity was prepped and draped in normal sterile fashion. Surgical timeout occurred verifying patient identification, surgical site, surgical procedure and administration of antibiotics.  A modified anterior Smith-Peterson approach to the hip was performed, using the interval between tensor fascia lata and sartorius.  Dissection was carried bluntly down onto the anterior hip capsule. The lateral femoral circumflex vessels were identified and coagulated. A capsulotomy was performed and the capsular flaps tagged for later repair.  The neck osteotomy was performed. The femoral head was removed which showed severe wear and joint effusion, the acetabular rim was cleared of soft tissue and attention was turned to reaming the acetabulum.  Sequential reaming was performed under fluoroscopic guidance. We reamed to a size 53 mm, and then impacted the acetabular shell. A 25 mm cancellous screw was placed through the shell for added fixation.  The liner was then placed after irrigation and attention turned to the femur.  After placing the femoral hook, the leg was taken to externally rotated,  extended and adducted position taking care to perform soft tissue releases to allow for adequate mobilization of the femur. Soft tissue was cleared from the shoulder of the greater trochanter and the hook elevator used to improve exposure of the proximal femur. Sequential broaching performed up to a size 6. Trial neck and head were placed. The leg was brought back up to neutral and the construct reduced.  Antibiotic irrigation was placed in the surgical wound.  The position and sizing of components, offset and leg lengths were checked using fluoroscopy. Stability of the construct was checked in extension and external rotation without any subluxation or impingement of prosthesis. We dislocated the prosthesis, dropped the leg back into position, removed trial components, and irrigated copiously. The final stem and head was then placed, the leg brought back up, the system reduced and fluoroscopy used to verify positioning.  We irrigated, obtained hemostasis and closed the capsule using #2 ethibond suture.  One gram of vancomycin powder was placed in the surgical bed.   One gram of topical tranexamic acid was injected into the joint.  The fascia was closed with #1 vicryl plus, the deep fat layer was closed with 0 vicryl, the subcutaneous layers closed with 2.0 Vicryl Plus and the skin closed with 2.0 nylon and dermabond. A sterile dressing was applied. The patient was awakened in the operating room and taken to recovery in stable condition.  All sponge, needle, and instrument counts were correct at the end of the case.   Joshua Burton, my PA, was a medical necessity for opening, closing, limb positioning, retracting, exposing, and overall facilitation and timely completion of the surgery.  Position: supine  Complications: see description of procedure.  Time Out: performed   Drains/Packing: none  Estimated blood loss: see anesthesia record  Returned to Recovery Room: in good condition.   Antibiotics:  yes   Mechanical VTE (DVT) Prophylaxis: sequential compression devices, TED thigh-high  Chemical VTE (DVT) Prophylaxis: aspirin   Fluid Replacement: see anesthesia record  Specimens Removed: 1 to pathology   Sponge and Instrument Count Correct? yes   PACU: portable radiograph - low AP   Plan/RTC: Return in 2 weeks for staple removal. Weight Bearing/Load Lower Extremity: full  Hip precautions: none Suture Removal: 2 weeks   N. Joshua Roux, MD [facility] 11:08 AM   Implant Name Type Inv. Item Serial No. Manufacturer Lot No. LRB No. Used Action  ACETAB CUP W/GRIPTION 54 - [id number] Plate ACETAB CUP W/GRIPTION 54  [employer] [id number] Left 1 Implanted  LINER NEUTRAL 54X36MM PLUS 4 - [id number] Hips LINER NEUTRAL 54X36MM PLUS 4  [employer]PU[employer] [id number] Left 1 Implanted  SCREW 6.5MMX25MM - [id number] Screw SCREW 6.5MMX25MM  [employer]PU[employer] [id number] Left 1 Implanted  STEM FEMORAL SZ6 HIGH ACTIS - [id number] Stem STEM FEMORAL SZ6 HIGH ACTIS  [employer]PU[employer]TH[employer] [id number] Left 1 Implanted  HEAD CERAMIC DELTA 36 PLUS 1.5 - [id number] Hips HEAD CERAMIC DELTA 36 PLUS 1.5  [employer] [id number] Left 1 Implanted
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
## 2022-06-28 ENCOUNTER — Encounter (HOSPITAL_COMMUNITY): Payer: Self-pay | Admitting: Orthopaedic Surgery

## 2022-06-28 DIAGNOSIS — Z96641 Presence of right artificial hip joint: Secondary | ICD-10-CM | POA: Diagnosis not present

## 2022-06-28 DIAGNOSIS — Z87891 Personal history of nicotine dependence: Secondary | ICD-10-CM | POA: Diagnosis not present

## 2022-06-28 DIAGNOSIS — Z79899 Other long term (current) drug therapy: Secondary | ICD-10-CM | POA: Diagnosis not present

## 2022-06-28 DIAGNOSIS — Z96652 Presence of left artificial knee joint: Secondary | ICD-10-CM | POA: Diagnosis not present

## 2022-06-28 DIAGNOSIS — Z7982 Long term (current) use of aspirin: Secondary | ICD-10-CM | POA: Diagnosis not present

## 2022-06-28 DIAGNOSIS — I10 Essential (primary) hypertension: Secondary | ICD-10-CM | POA: Diagnosis not present

## 2022-06-28 DIAGNOSIS — M1612 Unilateral primary osteoarthritis, left hip: Secondary | ICD-10-CM | POA: Diagnosis not present

## 2022-06-28 LAB — TYPE AND SCREEN
ABO/RH(D): B POS
Antibody Screen: NEGATIVE
Donor AG Type: NEGATIVE
Donor AG Type: NEGATIVE
Unit division: 0
Unit division: 0

## 2022-06-28 LAB — CBC
HCT: 37.9 % — ABNORMAL LOW (ref 39.0–52.0)
Hemoglobin: 13.1 g/dL (ref 13.0–17.0)
MCH: 32.8 pg (ref 26.0–34.0)
MCHC: 34.6 g/dL (ref 30.0–36.0)
MCV: 94.8 fL (ref 80.0–100.0)
Platelets: 156 10*3/uL (ref 150–400)
RBC: 4 MIL/uL — ABNORMAL LOW (ref 4.22–5.81)
RDW: 12.8 % (ref 11.5–15.5)
WBC: 8.7 10*3/uL (ref 4.0–10.5)
nRBC: 0 % (ref 0.0–0.2)

## 2022-06-28 LAB — BPAM RBC
Blood Product Expiration Date: 202309122359
Blood Product Expiration Date: 202309222359
Unit Type and Rh: 5100
Unit Type and Rh: 7300

## 2022-06-28 NOTE — Discharge Instructions (Signed)

## 2022-06-28 NOTE — Progress Notes (Signed)
   Subjective:  Patient reports pain as mild.   Walked twice with the nurses.  Objective:   VITALS:   Vitals:   06/27/22 1303 06/27/22 1933 06/27/22 2354 06/28/22 0447  BP: (!) 171/102 (!) 159/96 (!) 179/102 (!) 181/95  Pulse: 75 87 96 98  Resp: '19 18 18 20  '$ Temp: 98 F (36.7 C) 98.4 F (36.9 C) 98.4 F (36.9 C) 99.2 F (37.3 C)  TempSrc:  Oral Oral Oral  SpO2: 100% 98% 99% 100%  Weight:      Height:        Sensation intact distally Intact pulses distally Dorsiflexion/Plantar flexion intact Incision: dressing C/D/I and no drainage   Lab Results  Component Value Date   WBC 5.5 06/20/2022   HGB 14.6 06/20/2022   HCT 42.8 06/20/2022   MCV 95.7 06/20/2022   PLT 173 06/20/2022     Assessment/Plan:  1 Day Post-Op   - Expected postop acute blood loss anemia - will monitor for symptoms - Up with PT/OT - DVT ppx - SCDs, ambulation, aspirin - WBAT operative extremity - Pain control - Discharge planning - home with [facility] services  Eduard Roux 06/28/2022, 6:50 AM
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
## 2022-06-28 NOTE — Progress Notes (Addendum)
Physical Therapy Treatment Patient Details Name: Joshua Burton MRN: [id number] DOB: 1951-01-24 Today's Date: 06/28/2022   History of Present Illness Pt is a 71 y/o male s/p L THA, direct anterior. PMH includes Hepatitis C, HTN, R THA, L TKA, and alcoholic cirrhosis.    PT Comments    Pt sitting up in recliner, eager to go home. Pt reports pain is better today. Provided education on HEP, with focus on proper form. Pt able to demonstrate understanding of HEP. Pt able to ambulate in hallway in RW with supervision. Discussed car transfers. Pt reports no further questions at end of session. Significant other to be present shortly.   Recommendations for follow up therapy are one component of a multi-disciplinary discharge planning process, led by the attending physician.  Recommendations may be updated based on patient status, additional functional criteria and insurance authorization.  Follow Up Recommendations  Follow physician's recommendations for discharge plan and follow up therapies     Assistance Recommended at Discharge Intermittent Supervision/Assistance  Patient can return home with the following Assistance with cooking/housework;Assist for transportation;Help with stairs or ramp for entrance   Equipment Recommendations  None recommended by PT    Recommendations for Other Services       Precautions / Restrictions Precautions Precautions: Fall Restrictions Weight Bearing Restrictions: Yes LLE Weight Bearing: Weight bearing as tolerated     Mobility  Bed Mobility               General bed mobility comments: up in recliner on entry    Transfers Overall transfer level: Needs assistance Equipment used: Rolling walker (2 wheels) Transfers: Sit to/from Stand, Bed to chair/wheelchair/BSC Sit to Stand: Min guard           General transfer comment: pt requiring min A for putting on shoes, and min guard for safety with power up and steadying from recliner     Ambulation/Gait Ambulation/Gait assistance: Supervision Gait Distance (Feet): 100 Feet Assistive device: Rolling walker (2 wheels) Gait Pattern/deviations: Step-through pattern, Decreased step length - right, Decreased step length - left, Decreased weight shift to left, Shuffle Gait velocity: slowed Gait velocity interpretation: 1.31 - 2.62 ft/sec, indicative of limited community ambulator   General Gait Details: supervision for safety with ambulation in hallway, able to quickly progress to step over step pattern, cues for upright posture and proximity to RW         Balance Overall balance assessment: Needs assistance Sitting-balance support: No upper extremity supported, Feet supported Sitting balance-Leahy Scale: Fair     Standing balance support: Bilateral upper extremity supported, No upper extremity supported, Single extremity supported, During functional activity Standing balance-Leahy Scale: Fair Standing balance comment: able to static stand without UE support requires min UE support for dynamic tasks                            Cognition Arousal/Alertness: Awake/alert Behavior During Therapy: WFL for tasks assessed/performed Overall Cognitive Status: Within Functional Limits for tasks assessed                                          Exercises Total Joint Exercises Ankle Circles/Pumps: AROM, Both, 10 reps Quad Sets: AROM, Left, 10 reps, Seated (in recliner with legs elevated) Short Arc Quad: AROM, Left, 10 reps, Seated (in recliner with legs elevated) Heel Slides: AROM, Left,  10 reps, Seated (in recliner with legs elevated) Hip ABduction/ADduction: AROM, Left, 10 reps, Seated, Standing Long Arc Quad: AROM, 10 reps, Seated, Left Knee Flexion: AROM, Left, 10 reps, Standing Marching in Standing: AROM, Both, 10 reps, Standing Standing Hip Extension: AROM, Left, 10 reps, Standing    General Comments General comments (skin integrity,  edema, etc.): VSS on RA, dressing intact      Pertinent Vitals/Pain Pain Assessment Pain Assessment: 0-10 Pain Score: 4  Pain Location: L hip Pain Descriptors / Indicators: Grimacing, Guarding Pain Intervention(s): Limited activity within patient's tolerance, Monitored during session, Repositioned     PT Goals (current goals can now be found in the care plan section) Acute Rehab PT Goals Patient Stated Goal: to go home PT Goal Formulation: With patient Time For Goal Achievement: 07/11/22 Potential to Achieve Goals: Good Progress towards PT goals: Progressing toward goals    Frequency    7X/week      PT Plan Current plan remains appropriate       AM-PAC PT "6 Clicks" Mobility   Outcome Measure  Help needed turning from your back to your side while in a flat bed without using bedrails?: None Help needed moving from lying on your back to sitting on the side of a flat bed without using bedrails?: A Little Help needed moving to and from a bed to a chair (including a wheelchair)?: A Little Help needed standing up from a chair using your arms (e.g., wheelchair or bedside chair)?: A Little Help needed to walk in hospital room?: A Little Help needed climbing 3-5 steps with a railing? : A Lot 6 Click Score: 18    End of Session Equipment Utilized During Treatment: Gait belt Activity Tolerance: Patient limited by pain Patient left: in chair;with call bell/phone within reach Nurse Communication: Mobility status PT Visit Diagnosis: Unsteadiness on feet (R26.81);Muscle weakness (generalized) (M62.81);Pain Pain - Right/Left: Left Pain - part of body: Hip     Time: 6381-7711 PT Time Calculation (min) (ACUTE ONLY): 27 min  Charges:  $Gait Training: 8-22 mins $Therapeutic Exercise: 8-22 mins                     Ibeth Fahmy B. Migdalia Dk PT, DPT Acute Rehabilitation Services Please use secure chat or  Call Office [phone]    [place] 06/28/2022, 11:45  AM

## 2022-06-28 NOTE — Progress Notes (Signed)
Patient alert and oriented, mae's well, voiding adequate amount of urine, swallowing without difficulty, no c/o pain at time of discharge. Patient discharged home with family. Script and discharged instructions given to patient. Patient and family stated understanding of instructions given. Patient has an appointment with Dr. Xu 

## 2022-06-29 ENCOUNTER — Telehealth: Payer: Self-pay | Admitting: *Deleted

## 2022-06-29 DIAGNOSIS — Z471 Aftercare following joint replacement surgery: Secondary | ICD-10-CM | POA: Diagnosis not present

## 2022-06-29 DIAGNOSIS — K7030 Alcoholic cirrhosis of liver without ascites: Secondary | ICD-10-CM | POA: Diagnosis not present

## 2022-06-29 DIAGNOSIS — H35033 Hypertensive retinopathy, bilateral: Secondary | ICD-10-CM | POA: Diagnosis not present

## 2022-06-29 DIAGNOSIS — K766 Portal hypertension: Secondary | ICD-10-CM | POA: Diagnosis not present

## 2022-06-29 DIAGNOSIS — H3323 Serous retinal detachment, bilateral: Secondary | ICD-10-CM | POA: Diagnosis not present

## 2022-06-29 DIAGNOSIS — Z7982 Long term (current) use of aspirin: Secondary | ICD-10-CM | POA: Diagnosis not present

## 2022-06-29 DIAGNOSIS — I10 Essential (primary) hypertension: Secondary | ICD-10-CM | POA: Diagnosis not present

## 2022-06-29 DIAGNOSIS — M1990 Unspecified osteoarthritis, unspecified site: Secondary | ICD-10-CM | POA: Diagnosis not present

## 2022-06-29 DIAGNOSIS — I8500 Esophageal varices without bleeding: Secondary | ICD-10-CM | POA: Diagnosis not present

## 2022-06-29 NOTE — Telephone Encounter (Signed)
Attempted D/C call to patient to check status. Left VM requesting call back.

## 2022-07-01 ENCOUNTER — Telehealth: Payer: Self-pay | Admitting: *Deleted

## 2022-07-01 DIAGNOSIS — H35033 Hypertensive retinopathy, bilateral: Secondary | ICD-10-CM | POA: Diagnosis not present

## 2022-07-01 DIAGNOSIS — Z7982 Long term (current) use of aspirin: Secondary | ICD-10-CM | POA: Diagnosis not present

## 2022-07-01 DIAGNOSIS — I10 Essential (primary) hypertension: Secondary | ICD-10-CM | POA: Diagnosis not present

## 2022-07-01 DIAGNOSIS — H3323 Serous retinal detachment, bilateral: Secondary | ICD-10-CM | POA: Diagnosis not present

## 2022-07-01 DIAGNOSIS — K7030 Alcoholic cirrhosis of liver without ascites: Secondary | ICD-10-CM | POA: Diagnosis not present

## 2022-07-01 DIAGNOSIS — M1990 Unspecified osteoarthritis, unspecified site: Secondary | ICD-10-CM | POA: Diagnosis not present

## 2022-07-01 DIAGNOSIS — Z471 Aftercare following joint replacement surgery: Secondary | ICD-10-CM | POA: Diagnosis not present

## 2022-07-01 DIAGNOSIS — K766 Portal hypertension: Secondary | ICD-10-CM | POA: Diagnosis not present

## 2022-07-01 DIAGNOSIS — I8500 Esophageal varices without bleeding: Secondary | ICD-10-CM | POA: Diagnosis not present

## 2022-07-01 NOTE — Telephone Encounter (Signed)
2nd attempt at D/C call to patient; no answer and left VM requesting call back.

## 2022-07-04 ENCOUNTER — Telehealth: Payer: Self-pay | Admitting: Orthopaedic Surgery

## 2022-07-04 ENCOUNTER — Other Ambulatory Visit: Payer: Self-pay | Admitting: Physician Assistant

## 2022-07-04 DIAGNOSIS — M1990 Unspecified osteoarthritis, unspecified site: Secondary | ICD-10-CM | POA: Diagnosis not present

## 2022-07-04 DIAGNOSIS — I10 Essential (primary) hypertension: Secondary | ICD-10-CM | POA: Diagnosis not present

## 2022-07-04 DIAGNOSIS — H35033 Hypertensive retinopathy, bilateral: Secondary | ICD-10-CM | POA: Diagnosis not present

## 2022-07-04 DIAGNOSIS — K7030 Alcoholic cirrhosis of liver without ascites: Secondary | ICD-10-CM | POA: Diagnosis not present

## 2022-07-04 DIAGNOSIS — I8500 Esophageal varices without bleeding: Secondary | ICD-10-CM | POA: Diagnosis not present

## 2022-07-04 DIAGNOSIS — K766 Portal hypertension: Secondary | ICD-10-CM | POA: Diagnosis not present

## 2022-07-04 DIAGNOSIS — H3323 Serous retinal detachment, bilateral: Secondary | ICD-10-CM | POA: Diagnosis not present

## 2022-07-04 DIAGNOSIS — Z471 Aftercare following joint replacement surgery: Secondary | ICD-10-CM | POA: Diagnosis not present

## 2022-07-04 DIAGNOSIS — Z7982 Long term (current) use of aspirin: Secondary | ICD-10-CM | POA: Diagnosis not present

## 2022-07-04 MED ORDER — OXYCODONE-ACETAMINOPHEN 5-325 MG PO TABS: 1.0000 | ORAL_TABLET | Freq: Four times a day (QID) | ORAL | 0 refills | Status: DC | PRN

## 2022-07-04 NOTE — Telephone Encounter (Signed)
Pt called requesting a refill of pain medication. Please send to [facility]. Pt phone number is [phone]

## 2022-07-04 NOTE — Telephone Encounter (Signed)
sent 

## 2022-07-06 ENCOUNTER — Telehealth: Payer: Self-pay | Admitting: Orthopaedic Surgery

## 2022-07-06 NOTE — Telephone Encounter (Signed)
Tried to call patient. No answer. LMOM for him to call me back so we can see what he is needing.

## 2022-07-06 NOTE — Telephone Encounter (Signed)
Pt called requesting fax [facility] script and office note. Pt's phone was no too clear to hear so please call to verify what script is for. [facility] fax number is [phone]. Pt phone number is [phone]

## 2022-07-07 ENCOUNTER — Telehealth: Payer: Self-pay | Admitting: *Deleted

## 2022-07-07 DIAGNOSIS — I8500 Esophageal varices without bleeding: Secondary | ICD-10-CM | POA: Diagnosis not present

## 2022-07-07 DIAGNOSIS — Z7982 Long term (current) use of aspirin: Secondary | ICD-10-CM | POA: Diagnosis not present

## 2022-07-07 DIAGNOSIS — Z471 Aftercare following joint replacement surgery: Secondary | ICD-10-CM | POA: Diagnosis not present

## 2022-07-07 DIAGNOSIS — K766 Portal hypertension: Secondary | ICD-10-CM | POA: Diagnosis not present

## 2022-07-07 DIAGNOSIS — M1990 Unspecified osteoarthritis, unspecified site: Secondary | ICD-10-CM | POA: Diagnosis not present

## 2022-07-07 DIAGNOSIS — I10 Essential (primary) hypertension: Secondary | ICD-10-CM | POA: Diagnosis not present

## 2022-07-07 DIAGNOSIS — K7030 Alcoholic cirrhosis of liver without ascites: Secondary | ICD-10-CM | POA: Diagnosis not present

## 2022-07-07 DIAGNOSIS — H3323 Serous retinal detachment, bilateral: Secondary | ICD-10-CM | POA: Diagnosis not present

## 2022-07-07 DIAGNOSIS — H35033 Hypertensive retinopathy, bilateral: Secondary | ICD-10-CM | POA: Diagnosis not present

## 2022-07-07 NOTE — Telephone Encounter (Signed)
Rx for shoe lift for leg length discrepancy.  Thanks.

## 2022-07-07 NOTE — Telephone Encounter (Signed)
Ortho bundle call completed. 

## 2022-07-07 NOTE — Telephone Encounter (Signed)
Faxed to number provided. Called and let patient know.

## 2022-07-12 ENCOUNTER — Encounter: Payer: Medicare HMO | Admitting: Orthopaedic Surgery

## 2022-07-12 DIAGNOSIS — Z7982 Long term (current) use of aspirin: Secondary | ICD-10-CM | POA: Diagnosis not present

## 2022-07-12 DIAGNOSIS — M1990 Unspecified osteoarthritis, unspecified site: Secondary | ICD-10-CM | POA: Diagnosis not present

## 2022-07-12 DIAGNOSIS — K7030 Alcoholic cirrhosis of liver without ascites: Secondary | ICD-10-CM | POA: Diagnosis not present

## 2022-07-12 DIAGNOSIS — H3323 Serous retinal detachment, bilateral: Secondary | ICD-10-CM | POA: Diagnosis not present

## 2022-07-12 DIAGNOSIS — I8500 Esophageal varices without bleeding: Secondary | ICD-10-CM | POA: Diagnosis not present

## 2022-07-12 DIAGNOSIS — K766 Portal hypertension: Secondary | ICD-10-CM | POA: Diagnosis not present

## 2022-07-12 DIAGNOSIS — I10 Essential (primary) hypertension: Secondary | ICD-10-CM | POA: Diagnosis not present

## 2022-07-12 DIAGNOSIS — H35033 Hypertensive retinopathy, bilateral: Secondary | ICD-10-CM | POA: Diagnosis not present

## 2022-07-12 DIAGNOSIS — Z471 Aftercare following joint replacement surgery: Secondary | ICD-10-CM | POA: Diagnosis not present

## 2022-07-13 ENCOUNTER — Encounter: Payer: Self-pay | Admitting: Orthopaedic Surgery

## 2022-07-13 ENCOUNTER — Ambulatory Visit (INDEPENDENT_AMBULATORY_CARE_PROVIDER_SITE_OTHER): Payer: Medicare HMO | Admitting: Physician Assistant

## 2022-07-13 DIAGNOSIS — Z96642 Presence of left artificial hip joint: Secondary | ICD-10-CM
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
## 2022-07-14 ENCOUNTER — Telehealth: Payer: Self-pay | Admitting: *Deleted

## 2022-07-14 NOTE — Telephone Encounter (Signed)
Ortho bundle 14 day in office meeting completed. °

## 2022-07-15 DIAGNOSIS — I10 Essential (primary) hypertension: Secondary | ICD-10-CM | POA: Diagnosis not present

## 2022-07-15 DIAGNOSIS — H3323 Serous retinal detachment, bilateral: Secondary | ICD-10-CM | POA: Diagnosis not present

## 2022-07-15 DIAGNOSIS — Z7982 Long term (current) use of aspirin: Secondary | ICD-10-CM | POA: Diagnosis not present

## 2022-07-15 DIAGNOSIS — K7030 Alcoholic cirrhosis of liver without ascites: Secondary | ICD-10-CM | POA: Diagnosis not present

## 2022-07-15 DIAGNOSIS — M1990 Unspecified osteoarthritis, unspecified site: Secondary | ICD-10-CM | POA: Diagnosis not present

## 2022-07-15 DIAGNOSIS — Z471 Aftercare following joint replacement surgery: Secondary | ICD-10-CM | POA: Diagnosis not present

## 2022-07-15 DIAGNOSIS — I8500 Esophageal varices without bleeding: Secondary | ICD-10-CM | POA: Diagnosis not present

## 2022-07-15 DIAGNOSIS — K766 Portal hypertension: Secondary | ICD-10-CM | POA: Diagnosis not present

## 2022-07-15 DIAGNOSIS — H35033 Hypertensive retinopathy, bilateral: Secondary | ICD-10-CM | POA: Diagnosis not present

## 2022-07-18 ENCOUNTER — Telehealth: Payer: Self-pay | Admitting: *Deleted

## 2022-07-18 NOTE — Telephone Encounter (Signed)
Patient called and states he had his last therapy session at the end of last week. He had already walked around 1-1.5 miles prior to therapist coming and they had him do an exercise where he sat in a chair with his arms crossed and stood/sat down without using his arms. He says there was a sharp stabbing pain at one point and the muscle pain had increased over the weekend. He has been icing/using muscle relaxer and resting...Marland Kitchenfeels better today, but still there. He notes he doesn't feel it is in the joint, but definitely muscles. I asked him to continue to rest, ice, and use his muscle relaxer, but that I would let you know to see if you had any other recommendations. I told him those muscle just most likely didn't like that exercise and to focus on walking instead of specific targeted exercises after it feels better.

## 2022-08-02 ENCOUNTER — Encounter: Payer: Medicare HMO | Admitting: Orthopaedic Surgery

## 2022-08-03 DIAGNOSIS — H04123 Dry eye syndrome of bilateral lacrimal glands: Secondary | ICD-10-CM | POA: Diagnosis not present

## 2022-08-03 DIAGNOSIS — H401132 Primary open-angle glaucoma, bilateral, moderate stage: Secondary | ICD-10-CM | POA: Diagnosis not present

## 2022-08-03 DIAGNOSIS — L570 Actinic keratosis: Secondary | ICD-10-CM | POA: Diagnosis not present

## 2022-08-03 DIAGNOSIS — H21233 Degeneration of iris (pigmentary), bilateral: Secondary | ICD-10-CM | POA: Diagnosis not present

## 2022-08-03 DIAGNOSIS — X32XXXD Exposure to sunlight, subsequent encounter: Secondary | ICD-10-CM | POA: Diagnosis not present

## 2022-08-03 DIAGNOSIS — B078 Other viral warts: Secondary | ICD-10-CM | POA: Diagnosis not present

## 2022-08-11 ENCOUNTER — Ambulatory Visit (INDEPENDENT_AMBULATORY_CARE_PROVIDER_SITE_OTHER): Payer: Medicare HMO

## 2022-08-11 ENCOUNTER — Telehealth: Payer: Self-pay | Admitting: *Deleted

## 2022-08-11 ENCOUNTER — Ambulatory Visit (INDEPENDENT_AMBULATORY_CARE_PROVIDER_SITE_OTHER): Payer: Medicare HMO | Admitting: Orthopaedic Surgery

## 2022-08-11 DIAGNOSIS — Z96642 Presence of left artificial hip joint: Secondary | ICD-10-CM | POA: Diagnosis not present
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
## 2022-08-11 NOTE — Telephone Encounter (Signed)
Ortho bundle 1 year call completed for Left total knee replacement done September 2022.

## 2022-08-11 NOTE — Telephone Encounter (Signed)
Ortho bundle in person meeting completed.

## 2022-09-22 ENCOUNTER — Ambulatory Visit (INDEPENDENT_AMBULATORY_CARE_PROVIDER_SITE_OTHER): Payer: Medicare HMO | Admitting: Physician Assistant

## 2022-09-22 ENCOUNTER — Encounter: Payer: Self-pay | Admitting: Orthopaedic Surgery

## 2022-09-22 DIAGNOSIS — Z96642 Presence of left artificial hip joint: Secondary | ICD-10-CM
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
## 2022-10-19 DIAGNOSIS — C44319 Basal cell carcinoma of skin of other parts of face: Secondary | ICD-10-CM | POA: Diagnosis not present

## 2022-10-19 DIAGNOSIS — L258 Unspecified contact dermatitis due to other agents: Secondary | ICD-10-CM | POA: Diagnosis not present

## 2022-10-19 DIAGNOSIS — L570 Actinic keratosis: Secondary | ICD-10-CM | POA: Diagnosis not present

## 2022-10-19 DIAGNOSIS — X32XXXD Exposure to sunlight, subsequent encounter: Secondary | ICD-10-CM | POA: Diagnosis not present

## 2022-11-09 DIAGNOSIS — H401132 Primary open-angle glaucoma, bilateral, moderate stage: Secondary | ICD-10-CM | POA: Diagnosis not present
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
## 2022-12-15 ENCOUNTER — Encounter: Payer: Self-pay | Admitting: Orthopaedic Surgery

## 2022-12-15 ENCOUNTER — Ambulatory Visit (INDEPENDENT_AMBULATORY_CARE_PROVIDER_SITE_OTHER): Payer: Medicare HMO

## 2022-12-15 ENCOUNTER — Ambulatory Visit: Payer: Medicare HMO | Admitting: Orthopaedic Surgery

## 2022-12-15 DIAGNOSIS — Z96642 Presence of left artificial hip joint: Secondary | ICD-10-CM

## 2022-12-15 DIAGNOSIS — Z96652 Presence of left artificial knee joint: Secondary | ICD-10-CM

## 2022-12-16 DIAGNOSIS — L570 Actinic keratosis: Secondary | ICD-10-CM | POA: Diagnosis not present

## 2022-12-16 DIAGNOSIS — X32XXXD Exposure to sunlight, subsequent encounter: Secondary | ICD-10-CM | POA: Diagnosis not present

## 2022-12-16 DIAGNOSIS — C4441 Basal cell carcinoma of skin of scalp and neck: Secondary | ICD-10-CM | POA: Diagnosis not present

## 2022-12-16 DIAGNOSIS — Z85828 Personal history of other malignant neoplasm of skin: Secondary | ICD-10-CM | POA: Diagnosis not present

## 2022-12-16 DIAGNOSIS — L820 Inflamed seborrheic keratosis: Secondary | ICD-10-CM | POA: Diagnosis not present

## 2022-12-16 DIAGNOSIS — Z08 Encounter for follow-up examination after completed treatment for malignant neoplasm: Secondary | ICD-10-CM | POA: Diagnosis not present

## 2022-12-20 ENCOUNTER — Telehealth: Payer: Self-pay | Admitting: *Deleted

## 2022-12-20 NOTE — Telephone Encounter (Signed)
12/15/22 office visit- Ortho bundle call completed.

## 2022-12-27 DIAGNOSIS — M79642 Pain in left hand: Secondary | ICD-10-CM | POA: Diagnosis not present

## 2022-12-27 DIAGNOSIS — G5601 Carpal tunnel syndrome, right upper limb: Secondary | ICD-10-CM | POA: Diagnosis not present

## 2023-01-06 ENCOUNTER — Other Ambulatory Visit: Payer: Self-pay | Admitting: Gastroenterology

## 2023-01-06 DIAGNOSIS — K7460 Unspecified cirrhosis of liver: Secondary | ICD-10-CM

## 2023-01-06 DIAGNOSIS — I10 Essential (primary) hypertension: Secondary | ICD-10-CM | POA: Diagnosis not present

## 2023-01-06 DIAGNOSIS — Z1211 Encounter for screening for malignant neoplasm of colon: Secondary | ICD-10-CM | POA: Diagnosis not present

## 2023-01-11 ENCOUNTER — Encounter (INDEPENDENT_AMBULATORY_CARE_PROVIDER_SITE_OTHER): Payer: Medicare HMO | Admitting: Ophthalmology

## 2023-01-12 DIAGNOSIS — Z6829 Body mass index (BMI) 29.0-29.9, adult: Secondary | ICD-10-CM | POA: Diagnosis not present

## 2023-01-12 DIAGNOSIS — I10 Essential (primary) hypertension: Secondary | ICD-10-CM | POA: Diagnosis not present

## 2023-01-12 DIAGNOSIS — Z79899 Other long term (current) drug therapy: Secondary | ICD-10-CM | POA: Diagnosis not present

## 2023-01-18 DIAGNOSIS — K766 Portal hypertension: Secondary | ICD-10-CM | POA: Diagnosis not present

## 2023-01-18 DIAGNOSIS — K2100 Gastro-esophageal reflux disease with esophagitis, without bleeding: Secondary | ICD-10-CM | POA: Diagnosis not present

## 2023-01-18 DIAGNOSIS — K7460 Unspecified cirrhosis of liver: Secondary | ICD-10-CM | POA: Diagnosis not present

## 2023-01-18 DIAGNOSIS — I8510 Secondary esophageal varices without bleeding: Secondary | ICD-10-CM | POA: Diagnosis not present

## 2023-01-27 DIAGNOSIS — S30860A Insect bite (nonvenomous) of lower back and pelvis, initial encounter: Secondary | ICD-10-CM | POA: Diagnosis not present

## 2023-01-27 DIAGNOSIS — C4441 Basal cell carcinoma of skin of scalp and neck: Secondary | ICD-10-CM | POA: Diagnosis not present

## 2023-01-27 DIAGNOSIS — Z08 Encounter for follow-up examination after completed treatment for malignant neoplasm: Secondary | ICD-10-CM | POA: Diagnosis not present

## 2023-01-27 DIAGNOSIS — X32XXXD Exposure to sunlight, subsequent encounter: Secondary | ICD-10-CM | POA: Diagnosis not present

## 2023-01-27 DIAGNOSIS — Z85828 Personal history of other malignant neoplasm of skin: Secondary | ICD-10-CM | POA: Diagnosis not present

## 2023-01-27 DIAGNOSIS — L570 Actinic keratosis: Secondary | ICD-10-CM | POA: Diagnosis not present

## 2023-02-01 DIAGNOSIS — G5603 Carpal tunnel syndrome, bilateral upper limbs: Secondary | ICD-10-CM | POA: Diagnosis not present

## 2023-02-01 DIAGNOSIS — G5601 Carpal tunnel syndrome, right upper limb: Secondary | ICD-10-CM | POA: Diagnosis not present

## 2023-02-08 ENCOUNTER — Ambulatory Visit
Admission: RE | Admit: 2023-02-08 | Discharge: 2023-02-08 | Disposition: A | Discharge status: Home / Self Care | Payer: Medicare HMO | Source: Ambulatory Visit | Attending: Gastroenterology | Admitting: Gastroenterology

## 2023-02-08 DIAGNOSIS — Z9049 Acquired absence of other specified parts of digestive tract: Secondary | ICD-10-CM | POA: Diagnosis not present

## 2023-02-08 DIAGNOSIS — K7460 Unspecified cirrhosis of liver: Secondary | ICD-10-CM
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
## 2023-03-20 ENCOUNTER — Ambulatory Visit (INDEPENDENT_AMBULATORY_CARE_PROVIDER_SITE_OTHER): Payer: Medicare HMO | Admitting: Ophthalmology

## 2023-03-20 ENCOUNTER — Encounter (INDEPENDENT_AMBULATORY_CARE_PROVIDER_SITE_OTHER): Payer: Self-pay | Admitting: Ophthalmology

## 2023-03-20 DIAGNOSIS — H35373 Puckering of macula, bilateral: Secondary | ICD-10-CM | POA: Diagnosis not present

## 2023-03-20 DIAGNOSIS — I10 Essential (primary) hypertension: Secondary | ICD-10-CM | POA: Diagnosis not present

## 2023-03-20 DIAGNOSIS — H35033 Hypertensive retinopathy, bilateral: Secondary | ICD-10-CM | POA: Diagnosis not present

## 2023-03-20 DIAGNOSIS — H401130 Primary open-angle glaucoma, bilateral, stage unspecified: Secondary | ICD-10-CM

## 2023-03-20 DIAGNOSIS — H3322 Serous retinal detachment, left eye: Secondary | ICD-10-CM

## 2023-03-20 DIAGNOSIS — Z8669 Personal history of other diseases of the nervous system and sense organs: Secondary | ICD-10-CM

## 2023-03-20 DIAGNOSIS — Z961 Presence of intraocular lens: Secondary | ICD-10-CM | POA: Diagnosis not present

## 2023-04-05 DIAGNOSIS — M79642 Pain in left hand: Secondary | ICD-10-CM | POA: Diagnosis not present

## 2023-04-05 DIAGNOSIS — G5601 Carpal tunnel syndrome, right upper limb: Secondary | ICD-10-CM | POA: Diagnosis not present

## 2023-04-07 DIAGNOSIS — Z08 Encounter for follow-up examination after completed treatment for malignant neoplasm: Secondary | ICD-10-CM | POA: Diagnosis not present

## 2023-04-07 DIAGNOSIS — Z85828 Personal history of other malignant neoplasm of skin: Secondary | ICD-10-CM | POA: Diagnosis not present

## 2023-04-07 DIAGNOSIS — L570 Actinic keratosis: Secondary | ICD-10-CM | POA: Diagnosis not present

## 2023-04-07 DIAGNOSIS — X32XXXD Exposure to sunlight, subsequent encounter: Secondary | ICD-10-CM | POA: Diagnosis not present

## 2023-04-07 DIAGNOSIS — L821 Other seborrheic keratosis: Secondary | ICD-10-CM | POA: Diagnosis not present

## 2023-07-04 DIAGNOSIS — X32XXXD Exposure to sunlight, subsequent encounter: Secondary | ICD-10-CM | POA: Diagnosis not present

## 2023-07-04 DIAGNOSIS — L570 Actinic keratosis: Secondary | ICD-10-CM | POA: Diagnosis not present

## 2023-08-29 ENCOUNTER — Other Ambulatory Visit: Payer: Self-pay | Admitting: Gastroenterology

## 2023-08-29 DIAGNOSIS — K7469 Other cirrhosis of liver: Secondary | ICD-10-CM

## 2023-09-05 ENCOUNTER — Other Ambulatory Visit: Payer: Medicare HMO

## 2023-09-13 ENCOUNTER — Ambulatory Visit
Admission: RE | Admit: 2023-09-13 | Discharge: 2023-09-13 | Disposition: A | Discharge status: Home / Self Care | Payer: Medicare HMO | Source: Ambulatory Visit | Attending: Gastroenterology | Admitting: Gastroenterology

## 2023-09-13 DIAGNOSIS — K7469 Other cirrhosis of liver: Secondary | ICD-10-CM

## 2023-09-13 DIAGNOSIS — K7460 Unspecified cirrhosis of liver: Secondary | ICD-10-CM | POA: Diagnosis not present

## 2023-09-13 DIAGNOSIS — X32XXXD Exposure to sunlight, subsequent encounter: Secondary | ICD-10-CM | POA: Diagnosis not present

## 2023-09-13 DIAGNOSIS — L570 Actinic keratosis: Secondary | ICD-10-CM | POA: Diagnosis not present

## 2023-09-13 DIAGNOSIS — B078 Other viral warts: Secondary | ICD-10-CM | POA: Diagnosis not present

## 2023-11-03 ENCOUNTER — Telehealth: Payer: Self-pay | Admitting: Orthopaedic Surgery

## 2023-11-03 NOTE — Telephone Encounter (Signed)
Notified patient.

## 2023-11-03 NOTE — Telephone Encounter (Signed)
Pt called requesting a prescription of meloxicam. Please send to [facility]. Pt notified Dr Roda Shutters is not in and will be back in office 1/7. Pt understood. Pt phone number is [phone].

## 2023-11-03 NOTE — Telephone Encounter (Signed)
aprove

## 2023-12-11 DIAGNOSIS — L820 Inflamed seborrheic keratosis: Secondary | ICD-10-CM | POA: Diagnosis not present

## 2023-12-11 DIAGNOSIS — X32XXXD Exposure to sunlight, subsequent encounter: Secondary | ICD-10-CM | POA: Diagnosis not present

## 2023-12-11 DIAGNOSIS — L570 Actinic keratosis: Secondary | ICD-10-CM | POA: Diagnosis not present

## 2023-12-11 DIAGNOSIS — L218 Other seborrheic dermatitis: Secondary | ICD-10-CM | POA: Diagnosis not present

## 2023-12-13 DIAGNOSIS — R531 Weakness: Secondary | ICD-10-CM | POA: Diagnosis not present

## 2023-12-13 DIAGNOSIS — R079 Chest pain, unspecified: Secondary | ICD-10-CM | POA: Diagnosis not present

## 2023-12-13 DIAGNOSIS — R0902 Hypoxemia: Secondary | ICD-10-CM | POA: Diagnosis not present

## 2023-12-13 DIAGNOSIS — R918 Other nonspecific abnormal finding of lung field: Secondary | ICD-10-CM | POA: Diagnosis not present

## 2023-12-13 DIAGNOSIS — X58XXXA Exposure to other specified factors, initial encounter: Secondary | ICD-10-CM | POA: Diagnosis not present

## 2023-12-13 DIAGNOSIS — Z1152 Encounter for screening for COVID-19: Secondary | ICD-10-CM | POA: Diagnosis not present

## 2023-12-13 DIAGNOSIS — R0789 Other chest pain: Secondary | ICD-10-CM | POA: Diagnosis not present

## 2023-12-13 DIAGNOSIS — L299 Pruritus, unspecified: Secondary | ICD-10-CM | POA: Diagnosis not present

## 2023-12-13 DIAGNOSIS — R7989 Other specified abnormal findings of blood chemistry: Secondary | ICD-10-CM | POA: Diagnosis not present

## 2023-12-13 DIAGNOSIS — N179 Acute kidney failure, unspecified: Secondary | ICD-10-CM | POA: Diagnosis not present

## 2023-12-13 DIAGNOSIS — Z03818 Encounter for observation for suspected exposure to other biological agents ruled out: Secondary | ICD-10-CM | POA: Diagnosis not present

## 2023-12-13 DIAGNOSIS — R9431 Abnormal electrocardiogram [ECG] [EKG]: Secondary | ICD-10-CM | POA: Diagnosis not present

## 2023-12-13 DIAGNOSIS — T782XXA Anaphylactic shock, unspecified, initial encounter: Secondary | ICD-10-CM | POA: Diagnosis not present

## 2023-12-13 DIAGNOSIS — Z79899 Other long term (current) drug therapy: Secondary | ICD-10-CM | POA: Diagnosis not present

## 2024-03-07 DIAGNOSIS — Z1211 Encounter for screening for malignant neoplasm of colon: Secondary | ICD-10-CM | POA: Diagnosis not present

## 2024-03-07 DIAGNOSIS — K7460 Unspecified cirrhosis of liver: Secondary | ICD-10-CM | POA: Diagnosis not present

## 2024-03-07 DIAGNOSIS — I10 Essential (primary) hypertension: Secondary | ICD-10-CM | POA: Diagnosis not present
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
## 2024-03-18 ENCOUNTER — Encounter (INDEPENDENT_AMBULATORY_CARE_PROVIDER_SITE_OTHER): Payer: Self-pay | Admitting: Ophthalmology

## 2024-03-18 ENCOUNTER — Ambulatory Visit (INDEPENDENT_AMBULATORY_CARE_PROVIDER_SITE_OTHER): Payer: Medicare HMO | Admitting: Ophthalmology

## 2024-03-18 DIAGNOSIS — H3322 Serous retinal detachment, left eye: Secondary | ICD-10-CM | POA: Diagnosis not present

## 2024-03-18 DIAGNOSIS — H35373 Puckering of macula, bilateral: Secondary | ICD-10-CM

## 2024-03-18 DIAGNOSIS — Z961 Presence of intraocular lens: Secondary | ICD-10-CM

## 2024-03-18 DIAGNOSIS — H35033 Hypertensive retinopathy, bilateral: Secondary | ICD-10-CM

## 2024-03-18 DIAGNOSIS — Z8669 Personal history of other diseases of the nervous system and sense organs: Secondary | ICD-10-CM | POA: Diagnosis not present

## 2024-03-18 DIAGNOSIS — H401130 Primary open-angle glaucoma, bilateral, stage unspecified: Secondary | ICD-10-CM

## 2024-03-18 DIAGNOSIS — I10 Essential (primary) hypertension: Secondary | ICD-10-CM | POA: Diagnosis not present

## 2024-03-19 ENCOUNTER — Encounter (INDEPENDENT_AMBULATORY_CARE_PROVIDER_SITE_OTHER): Payer: Self-pay | Admitting: Ophthalmology
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
## 2024-04-11 DIAGNOSIS — Z9049 Acquired absence of other specified parts of digestive tract: Secondary | ICD-10-CM | POA: Diagnosis not present

## 2024-04-11 DIAGNOSIS — K7460 Unspecified cirrhosis of liver: Secondary | ICD-10-CM | POA: Diagnosis not present

## 2024-04-11 DIAGNOSIS — R932 Abnormal findings on diagnostic imaging of liver and biliary tract: Secondary | ICD-10-CM | POA: Diagnosis not present

## 2024-04-17 DIAGNOSIS — C4441 Basal cell carcinoma of skin of scalp and neck: Secondary | ICD-10-CM | POA: Diagnosis not present

## 2024-04-17 DIAGNOSIS — L570 Actinic keratosis: Secondary | ICD-10-CM | POA: Diagnosis not present

## 2024-04-17 DIAGNOSIS — X32XXXD Exposure to sunlight, subsequent encounter: Secondary | ICD-10-CM | POA: Diagnosis not present

## 2024-06-28 ENCOUNTER — Other Ambulatory Visit (INDEPENDENT_AMBULATORY_CARE_PROVIDER_SITE_OTHER): Payer: Self-pay

## 2024-06-28 ENCOUNTER — Ambulatory Visit: Admitting: Orthopaedic Surgery

## 2024-06-28 VITALS — Ht 69.5 in | Wt 223.0 lb

## 2024-06-28 DIAGNOSIS — M1711 Unilateral primary osteoarthritis, right knee: Secondary | ICD-10-CM | POA: Diagnosis not present
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
## 2024-07-24 DIAGNOSIS — T63451A Toxic effect of venom of hornets, accidental (unintentional), initial encounter: Secondary | ICD-10-CM | POA: Diagnosis not present

## 2024-09-03 ENCOUNTER — Other Ambulatory Visit: Payer: Self-pay | Admitting: Physician Assistant

## 2024-09-03 MED ORDER — METHOCARBAMOL 500 MG PO TABS
500.0000 mg | ORAL_TABLET | Freq: Two times a day (BID) | ORAL | 2 refills | Status: AC | PRN
Start: 2024-09-03 — End: ?

## 2024-09-03 MED ORDER — OXYCODONE HCL 5 MG PO TABS: 5.0000 mg | ORAL_TABLET | Freq: Four times a day (QID) | ORAL | 0 refills | Status: DC | PRN

## 2024-09-03 MED ORDER — DOCUSATE SODIUM 100 MG PO CAPS: 100.0000 mg | ORAL_CAPSULE | Freq: Every day | ORAL | 2 refills | Status: AC | PRN

## 2024-09-03 MED ORDER — ONDANSETRON HCL 4 MG PO TABS: 4.0000 mg | ORAL_TABLET | Freq: Three times a day (TID) | ORAL | 0 refills | Status: AC | PRN

## 2024-09-05 DIAGNOSIS — C44319 Basal cell carcinoma of skin of other parts of face: Secondary | ICD-10-CM | POA: Diagnosis not present

## 2024-09-05 DIAGNOSIS — L570 Actinic keratosis: Secondary | ICD-10-CM | POA: Diagnosis not present

## 2024-09-05 DIAGNOSIS — X32XXXD Exposure to sunlight, subsequent encounter: Secondary | ICD-10-CM | POA: Diagnosis not present

## 2024-09-09 ENCOUNTER — Encounter: Payer: Self-pay | Admitting: Radiology

## 2024-09-12 DIAGNOSIS — Z7689 Persons encountering health services in other specified circumstances: Secondary | ICD-10-CM | POA: Diagnosis not present

## 2024-09-12 DIAGNOSIS — Z8619 Personal history of other infectious and parasitic diseases: Secondary | ICD-10-CM | POA: Diagnosis not present

## 2024-09-12 DIAGNOSIS — Z91018 Allergy to other foods: Secondary | ICD-10-CM | POA: Diagnosis not present

## 2024-09-12 DIAGNOSIS — I10 Essential (primary) hypertension: Secondary | ICD-10-CM | POA: Diagnosis not present

## 2024-09-19 DIAGNOSIS — Z01818 Encounter for other preprocedural examination: Secondary | ICD-10-CM | POA: Diagnosis not present

## 2024-09-19 DIAGNOSIS — I10 Essential (primary) hypertension: Secondary | ICD-10-CM | POA: Diagnosis not present

## 2024-09-19 NOTE — Care Plan (Signed)
 Ortho Bundle Case Management Note  Patient Details  Name: Joshua Burton MRN: [id number] Date of Birth: 16-Sep-1951  [facility] RNCM call to patient and discussed his upcoming Right total knee arthroplasty with Dr. Jerri on 09/23/24 at [facility]. He is agreeable to case management. He has previously had both hips replaced as well as his left knee. He lives with his significant other and plans to return home with assistance from her after discharge. He has a RW and referral has been placed for home CPM. Anticipate HHPT will be needed after a short hospital stay. Referral made to [facility] after choice provided. Reviewed post op instructions and questions answered. Will continue to follow for needs.                 DME Arranged:  CPM (Has RW; ordered CPM through new company [employer]) DME Agency:     HH Arranged:  PT HH Agency:  [facility]  Additional Comments: Please contact me with any questions of if this plan should need to change.  Tylene Ned, RN, BSN, [employer]  [phone] 09/19/2024, 2:17 PM

## 2024-09-20 ENCOUNTER — Other Ambulatory Visit: Payer: Self-pay

## 2024-09-20 ENCOUNTER — Encounter (HOSPITAL_COMMUNITY): Payer: Self-pay | Admitting: Orthopaedic Surgery

## 2024-09-20 NOTE — Anesthesia Preprocedure Evaluation (Addendum)
 Anesthesia Evaluation  Patient identified by MRN, date of birth, ID band Patient awake    Reviewed: Allergy & Precautions, NPO status , Patient's Chart, lab work & pertinent test results  History of Anesthesia Complications Negative for: history of anesthetic complications  Airway Mallampati: III  TM Distance: >3 FB Neck ROM: Full   Comment: Previous grade I view with MAC 4, easy mask with OPA Dental  (+) Dental Advisory Given   Pulmonary neg pulmonary ROS   Pulmonary exam normal breath sounds clear to auscultation       Cardiovascular hypertension (clonidine , lisinopril ), Pt. on medications (-) angina (-) Past MI, (-) Cardiac Stents and (-) CABG (-) dysrhythmias  Rhythm:Regular Rate:Normal     Neuro/Psych  PSYCHIATRIC DISORDERS      negative neurological ROS     GI/Hepatic negative GI ROS,,,(+) Cirrhosis  (portal HTN)  Esophageal Varices  substance abuse  alcohol use, Hepatitis -, C  Endo/Other  negative endocrine ROS    Renal/GU negative Renal ROS     Musculoskeletal  (+) Arthritis , Osteoarthritis,    Abdominal   Peds  Hematology Lab Results      Component                Value               Date                      WBC                      4.8                 09/23/2024                HGB                      14.8                09/23/2024                HCT                      43.6                09/23/2024                MCV                      92.6                09/23/2024                PLT                      153                 09/23/2024              Anesthesia Other Findings Alpha-gal, Lyme disease  Previously refused spinal for bad experiences with spinal. Had GA for last 2 total joints. Desires GA today.  Reproductive/Obstetrics                              Anesthesia Physical Anesthesia Plan  ASA: 3  Anesthesia Plan: General   Post-op Pain Management: Tylenol   PO (pre-op)*  Induction: Intravenous  PONV Risk Score and Plan: 2 and Ondansetron , Dexamethasone  and Treatment may vary due to age or medical condition  Airway Management Planned: LMA  Additional Equipment:   Intra-op Plan:   Post-operative Plan: Extubation in OR  Informed Consent: I have reviewed the patients History and Physical, chart, labs and discussed the procedure including the risks, benefits and alternatives for the proposed anesthesia with the patient or authorized representative who has indicated his/her understanding and acceptance.     Dental advisory given  Plan Discussed with: CRNA and Anesthesiologist  Anesthesia Plan Comments: (PAT note written 09/20/2024 by Allison Zelenak, PA-C. Previously refused spinal anesthesia.   Risks of general anesthesia discussed including, but not limited to, sore throat, hoarse voice, chipped/damaged teeth, injury to vocal cords, nausea and vomiting, allergic reactions, lung infection, heart attack, stroke, and death. All questions answered.   )         Anesthesia Quick Evaluation
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
## 2024-09-20 NOTE — Progress Notes (Signed)
 SDW CALL  Patient was given pre-op instructions over the phone. The opportunity was given for the patient to ask questions. No further questions asked. Patient verbalized understanding of instructions given.  Date & arrival time- September 23, 2024 @ 9:00 am.  PCP -  Corean Rattler Cardiologist -   PPM/ICD - denies Device Orders - n/a Rep Notified - n/a  Chest x-ray -  EKG - requested Stress Test - denies ECHO - denies Cardiac Cath - denies  Sleep Study - denies CPAP - n/a  Dm-denies  Blood Thinner Instructions:denies Aspirin  Instructions:denies  ERAS Protcol -NPO   COVID TEST- n/a  Anesthesia review: Yes, HX of HTN  Patient denies shortness of breath, fever, cough and chest pain over the phone call   All instructions explained to the patient, with a verbal understanding of the material. Patient agrees to go over the instructions while at home for a better understanding.

## 2024-09-22 NOTE — Progress Notes (Signed)
----------  [facility] PROCEDURAL EXPEDITER NOTE---------------  Patient Name: Joshua Burton Patient DOB: [birth year] Today's Date: 09/22/24   Chart reviewed: Yes  Documentation gaps: N Orders in place:  Yes  Communication with surgical team if no orders: N/A Labs, test, and orders reviewed: Y - Anesthesia Order Set and Surgical PCR placed Requires surgical clearance: No Barriers noted: No Intervention provided by [facility] team: N/A Barrier resolved: N/A    Devere Penner, RN  [facility] Expeditor

## 2024-09-23 ENCOUNTER — Encounter (HOSPITAL_COMMUNITY): Payer: Self-pay | Admitting: Orthopaedic Surgery

## 2024-09-23 ENCOUNTER — Observation Stay (HOSPITAL_COMMUNITY)
Admission: RE | Admit: 2024-09-23 | Discharge: 2024-09-24 | Disposition: A | Discharge status: Home / Self Care | Attending: Orthopaedic Surgery | Admitting: Orthopaedic Surgery

## 2024-09-23 ENCOUNTER — Encounter (HOSPITAL_COMMUNITY)
Admission: RE | Disposition: A | Discharge status: Home / Self Care | Payer: Self-pay | Source: Home / Self Care | Attending: Orthopaedic Surgery

## 2024-09-23 ENCOUNTER — Other Ambulatory Visit (HOSPITAL_COMMUNITY): Payer: Self-pay

## 2024-09-23 ENCOUNTER — Observation Stay (HOSPITAL_COMMUNITY)

## 2024-09-23 ENCOUNTER — Ambulatory Visit (HOSPITAL_BASED_OUTPATIENT_CLINIC_OR_DEPARTMENT_OTHER): Payer: Self-pay | Admitting: Vascular Surgery

## 2024-09-23 ENCOUNTER — Ambulatory Visit (HOSPITAL_COMMUNITY): Payer: Self-pay | Admitting: Vascular Surgery

## 2024-09-23 ENCOUNTER — Other Ambulatory Visit: Payer: Self-pay | Admitting: Physician Assistant

## 2024-09-23 DIAGNOSIS — Z96652 Presence of left artificial knee joint: Secondary | ICD-10-CM | POA: Diagnosis not present

## 2024-09-23 DIAGNOSIS — Z7901 Long term (current) use of anticoagulants: Secondary | ICD-10-CM | POA: Insufficient documentation

## 2024-09-23 DIAGNOSIS — M1711 Unilateral primary osteoarthritis, right knee: Secondary | ICD-10-CM

## 2024-09-23 DIAGNOSIS — D62 Acute posthemorrhagic anemia: Secondary | ICD-10-CM | POA: Diagnosis not present

## 2024-09-23 DIAGNOSIS — Z96651 Presence of right artificial knee joint: Secondary | ICD-10-CM

## 2024-09-23 DIAGNOSIS — I10 Essential (primary) hypertension: Secondary | ICD-10-CM | POA: Diagnosis not present

## 2024-09-23 DIAGNOSIS — K7460 Unspecified cirrhosis of liver: Secondary | ICD-10-CM | POA: Diagnosis not present

## 2024-09-23 DIAGNOSIS — M25561 Pain in right knee: Secondary | ICD-10-CM | POA: Diagnosis not present

## 2024-09-23 DIAGNOSIS — M25461 Effusion, right knee: Secondary | ICD-10-CM | POA: Diagnosis not present

## 2024-09-23 DIAGNOSIS — Z87891 Personal history of nicotine dependence: Secondary | ICD-10-CM | POA: Insufficient documentation

## 2024-09-23 DIAGNOSIS — G8918 Other acute postprocedural pain: Secondary | ICD-10-CM | POA: Diagnosis not present

## 2024-09-23 DIAGNOSIS — Z96642 Presence of left artificial hip joint: Secondary | ICD-10-CM | POA: Insufficient documentation

## 2024-09-23 DIAGNOSIS — Z01818 Encounter for other preprocedural examination: Secondary | ICD-10-CM

## 2024-09-23 HISTORY — DX: Lyme disease, unspecified: A69.20

## 2024-09-23 HISTORY — PX: TOTAL KNEE ARTHROPLASTY: SHX125

## 2024-09-23 HISTORY — DX: Allergy to other foods: Z91.018

## 2024-09-23 LAB — SURGICAL PCR SCREEN
MRSA, PCR: POSITIVE — AB
Staphylococcus aureus: POSITIVE — AB

## 2024-09-23 LAB — COMPREHENSIVE METABOLIC PANEL WITH GFR
ALT: 24 U/L (ref 0–44)
AST: 29 U/L (ref 15–41)
Albumin: 4.1 g/dL (ref 3.5–5.0)
Alkaline Phosphatase: 72 U/L (ref 38–126)
Anion gap: 12 (ref 5–15)
BUN: 12 mg/dL (ref 8–23)
CO2: 19 mmol/L — ABNORMAL LOW (ref 22–32)
Calcium: 8.8 mg/dL — ABNORMAL LOW (ref 8.9–10.3)
Chloride: 101 mmol/L (ref 98–111)
Creatinine, Ser: 0.7 mg/dL (ref 0.61–1.24)
GFR, Estimated: >60 mL/min (ref >60)
Glucose, Bld: 100 mg/dL — ABNORMAL HIGH (ref 70–99)
Potassium: 4.3 mmol/L (ref 3.5–5.1)
Sodium: 132 mmol/L — ABNORMAL LOW (ref 135–145)
Total Bilirubin: 1.1 mg/dL (ref 0.0–1.2)
Total Protein: 7.1 g/dL (ref 6.5–8.1)

## 2024-09-23 LAB — APTT: aPTT: 30 s (ref 24–36)

## 2024-09-23 LAB — CBC
HCT: 43.6 % (ref 39.0–52.0)
Hemoglobin: 14.8 g/dL (ref 13.0–17.0)
MCH: 31.4 pg (ref 26.0–34.0)
MCHC: 33.9 g/dL (ref 30.0–36.0)
MCV: 92.6 fL (ref 80.0–100.0)
Platelets: 153 K/uL (ref 150–400)
RBC: 4.71 MIL/uL (ref 4.22–5.81)
RDW: 13.3 % (ref 11.5–15.5)
WBC: 4.8 K/uL (ref 4.0–10.5)
nRBC: 0 % (ref 0.0–0.2)

## 2024-09-23 LAB — PROTIME-INR
INR: 1.2 (ref 0.8–1.2)
Prothrombin Time: 15.4 s — ABNORMAL HIGH (ref 11.4–15.2)

## 2024-09-23 SURGERY — ARTHROPLASTY, KNEE, TOTAL
Anesthesia: Regional | Site: Knee | Laterality: Right

## 2024-09-23 MED ORDER — VANCOMYCIN HCL 1000 MG IV SOLR
INTRAVENOUS | Status: AC
  Filled 2024-09-23: qty 20

## 2024-09-23 MED ORDER — POVIDONE-IODINE 10 % EX SWAB: 2.0000 | Freq: Once | CUTANEOUS | Status: DC

## 2024-09-23 MED ORDER — HYDROMORPHONE HCL 1 MG/ML IJ SOLN
INTRAMUSCULAR | Status: AC
  Filled 2024-09-23: qty 2

## 2024-09-23 MED ORDER — ONDANSETRON HCL 4 MG/2ML IJ SOLN
INTRAMUSCULAR | Status: AC
  Filled 2024-09-23: qty 2

## 2024-09-23 MED ORDER — PHENOL 1.4 % MT LIQD
1.0000 | OROMUCOSAL | Status: DC | PRN
Start: 2024-09-23 — End: 2024-09-24

## 2024-09-23 MED ORDER — METOCLOPRAMIDE HCL 5 MG/ML IJ SOLN: 5.0000 mg | Freq: Three times a day (TID) | INTRAMUSCULAR | Status: DC | PRN

## 2024-09-23 MED ORDER — METHOCARBAMOL 500 MG PO TABS
500.0000 mg | ORAL_TABLET | Freq: Four times a day (QID) | ORAL | Status: DC | PRN
  Administered 2024-09-23 – 2024-09-24 (×2): 500 mg via ORAL
  Filled 2024-09-23 (×2): qty 1

## 2024-09-23 MED ORDER — LIDOCAINE 2% (20 MG/ML) 5 ML SYRINGE
INTRAMUSCULAR | Status: DC | PRN
  Administered 2024-09-23: 100 mg via INTRAVENOUS

## 2024-09-23 MED ORDER — ONDANSETRON HCL 4 MG/2ML IJ SOLN
INTRAMUSCULAR | Status: DC | PRN
Start: 2024-09-23 — End: 2024-09-23
  Administered 2024-09-23: 4 mg via INTRAVENOUS

## 2024-09-23 MED ORDER — KETOROLAC TROMETHAMINE 15 MG/ML IJ SOLN
7.5000 mg | Freq: Four times a day (QID) | INTRAMUSCULAR | Status: AC
  Administered 2024-09-23 – 2024-09-24 (×4): 7.5 mg via INTRAVENOUS
  Filled 2024-09-23 (×3): qty 1

## 2024-09-23 MED ORDER — OXYCODONE HCL 5 MG PO TABS: 5.0000 mg | ORAL_TABLET | Freq: Four times a day (QID) | ORAL | Status: DC | PRN

## 2024-09-23 MED ORDER — HYDROMORPHONE HCL 1 MG/ML IJ SOLN
1.0000 mg | Freq: Three times a day (TID) | INTRAMUSCULAR | Status: DC | PRN
  Administered 2024-09-23 – 2024-09-24 (×2): 1 mg via INTRAVENOUS
  Filled 2024-09-23 (×2): qty 1

## 2024-09-23 MED ORDER — CHLORHEXIDINE GLUCONATE CLOTH 2 % EX PADS
6.0000 | MEDICATED_PAD | Freq: Every day | CUTANEOUS | Status: DC
  Administered 2024-09-23 – 2024-09-24 (×2): 6 via TOPICAL

## 2024-09-23 MED ORDER — SODIUM CHLORIDE 0.9 % IR SOLN
Status: DC | PRN
  Administered 2024-09-23: 1000 mL

## 2024-09-23 MED ORDER — ACETAMINOPHEN 325 MG PO TABS: 325.0000 mg | ORAL_TABLET | Freq: Four times a day (QID) | ORAL | Status: DC | PRN

## 2024-09-23 MED ORDER — HYDRALAZINE HCL 20 MG/ML IJ SOLN
5.0000 mg | INTRAMUSCULAR | Status: DC | PRN
  Administered 2024-09-23 (×3): 5 mg via INTRAVENOUS

## 2024-09-23 MED ORDER — BUPIVACAINE-MELOXICAM ER 400-12 MG/14ML IJ SOLN
INTRAMUSCULAR | Status: DC | PRN
  Administered 2024-09-23: 400 mg

## 2024-09-23 MED ORDER — FENTANYL CITRATE (PF) 250 MCG/5ML IJ SOLN
INTRAMUSCULAR | Status: AC
  Filled 2024-09-23: qty 5

## 2024-09-23 MED ORDER — ONDANSETRON HCL 4 MG/2ML IJ SOLN: 4.0000 mg | Freq: Four times a day (QID) | INTRAMUSCULAR | Status: DC | PRN

## 2024-09-23 MED ORDER — DOCUSATE SODIUM 100 MG PO CAPS
100.0000 mg | ORAL_CAPSULE | Freq: Two times a day (BID) | ORAL | Status: DC
  Administered 2024-09-23 – 2024-09-24 (×2): 100 mg via ORAL
  Filled 2024-09-23 (×2): qty 1

## 2024-09-23 MED ORDER — EPHEDRINE SULFATE-NACL 50-0.9 MG/10ML-% IV SOSY
PREFILLED_SYRINGE | INTRAVENOUS | Status: DC | PRN
  Administered 2024-09-23: 5 mg via INTRAVENOUS

## 2024-09-23 MED ORDER — PRONTOSAN WOUND IRRIGATION OPTIME
TOPICAL | Status: DC | PRN
  Administered 2024-09-23: 1 via TOPICAL

## 2024-09-23 MED ORDER — METHOCARBAMOL 1000 MG/10ML IJ SOLN
500.0000 mg | Freq: Four times a day (QID) | INTRAMUSCULAR | Status: DC | PRN
Start: 2024-09-23 — End: 2024-09-24
  Administered 2024-09-23: 500 mg via INTRAVENOUS

## 2024-09-23 MED ORDER — TRANEXAMIC ACID 1000 MG/10ML IV SOLN
2000.0000 mg | INTRAVENOUS | Status: DC
  Filled 2024-09-23: qty 20

## 2024-09-23 MED ORDER — 0.9 % SODIUM CHLORIDE (POUR BTL) OPTIME
TOPICAL | Status: DC | PRN
  Administered 2024-09-23: 1000 mL

## 2024-09-23 MED ORDER — MIDAZOLAM HCL (PF) 2 MG/2ML IJ SOLN: 2.0000 mg | Freq: Once | INTRAMUSCULAR | Status: AC

## 2024-09-23 MED ORDER — VANCOMYCIN HCL 1000 MG IV SOLR
INTRAVENOUS | Status: DC | PRN
  Administered 2024-09-23: 1000 mg via TOPICAL

## 2024-09-23 MED ORDER — CEFAZOLIN SODIUM-DEXTROSE 2-4 GM/100ML-% IV SOLN
2.0000 g | INTRAVENOUS | Status: AC
  Administered 2024-09-23: 2 g via INTRAVENOUS
  Filled 2024-09-23: qty 100

## 2024-09-23 MED ORDER — APIXABAN 2.5 MG PO TABS
ORAL_TABLET | ORAL | 0 refills | Status: AC
  Filled 2024-09-23: qty 60, 30d supply, fill #0

## 2024-09-23 MED ORDER — BUPIVACAINE-MELOXICAM ER 400-12 MG/14ML IJ SOLN
INTRAMUSCULAR | Status: AC
  Filled 2024-09-23: qty 1

## 2024-09-23 MED ORDER — LACTATED RINGERS IV SOLN: INTRAVENOUS | Status: DC

## 2024-09-23 MED ORDER — OXYCODONE HCL 5 MG/5ML PO SOLN: 5.0000 mg | Freq: Once | ORAL | Status: DC | PRN

## 2024-09-23 MED ORDER — AMISULPRIDE (ANTIEMETIC) 5 MG/2ML IV SOLN: 10.0000 mg | Freq: Once | INTRAVENOUS | Status: DC | PRN

## 2024-09-23 MED ORDER — MUPIROCIN 2 % EX OINT
1.0000 | TOPICAL_OINTMENT | Freq: Two times a day (BID) | CUTANEOUS | Status: DC
  Administered 2024-09-23 – 2024-09-24 (×2): 1 via NASAL
  Filled 2024-09-23: qty 22

## 2024-09-23 MED ORDER — FENTANYL CITRATE (PF) 250 MCG/5ML IJ SOLN
INTRAMUSCULAR | Status: DC | PRN
  Administered 2024-09-23: 50 ug via INTRAVENOUS
  Administered 2024-09-23: 100 ug via INTRAVENOUS
  Administered 2024-09-23 (×2): 50 ug via INTRAVENOUS

## 2024-09-23 MED ORDER — KETOROLAC TROMETHAMINE 15 MG/ML IJ SOLN
INTRAMUSCULAR | Status: AC
  Filled 2024-09-23: qty 1

## 2024-09-23 MED ORDER — MIDAZOLAM HCL 2 MG/2ML IJ SOLN
INTRAMUSCULAR | Status: AC
  Administered 2024-09-23: 2 mg via INTRAVENOUS
  Filled 2024-09-23: qty 2

## 2024-09-23 MED ORDER — TRANEXAMIC ACID-NACL 1000-0.7 MG/100ML-% IV SOLN
1000.0000 mg | INTRAVENOUS | Status: AC
  Administered 2024-09-23: 1000 mg via INTRAVENOUS
  Filled 2024-09-23: qty 100

## 2024-09-23 MED ORDER — METOCLOPRAMIDE HCL 5 MG PO TABS
5.0000 mg | ORAL_TABLET | Freq: Three times a day (TID) | ORAL | Status: DC | PRN
  Filled 2024-09-23: qty 2

## 2024-09-23 MED ORDER — MENTHOL 3 MG MT LOZG: 1.0000 | LOZENGE | OROMUCOSAL | Status: DC | PRN

## 2024-09-23 MED ORDER — FENTANYL CITRATE (PF) 100 MCG/2ML IJ SOLN
25.0000 ug | INTRAMUSCULAR | Status: DC | PRN
  Administered 2024-09-23: 50 ug via INTRAVENOUS

## 2024-09-23 MED ORDER — HYDROMORPHONE HCL 1 MG/ML IJ SOLN
INTRAMUSCULAR | Status: DC | PRN
  Administered 2024-09-23 (×2): 1 mg via INTRAVENOUS
  Administered 2024-09-23: .5 mg via INTRAVENOUS

## 2024-09-23 MED ORDER — CHLORHEXIDINE GLUCONATE 0.12 % MT SOLN
15.0000 mL | Freq: Once | OROMUCOSAL | Status: AC
  Administered 2024-09-23: 15 mL via OROMUCOSAL
  Filled 2024-09-23: qty 15

## 2024-09-23 MED ORDER — DEXAMETHASONE SOD PHOSPHATE PF 10 MG/ML IJ SOLN
10.0000 mg | Freq: Once | INTRAMUSCULAR | Status: AC
  Administered 2024-09-24: 10 mg via INTRAVENOUS

## 2024-09-23 MED ORDER — LISINOPRIL 10 MG PO TABS
10.0000 mg | ORAL_TABLET | Freq: Every morning | ORAL | Status: DC
  Administered 2024-09-24: 10 mg via ORAL
  Filled 2024-09-23 (×2): qty 1

## 2024-09-23 MED ORDER — METHOCARBAMOL 1000 MG/10ML IJ SOLN
INTRAMUSCULAR | Status: AC
  Filled 2024-09-23: qty 10

## 2024-09-23 MED ORDER — ACETAMINOPHEN 10 MG/ML IV SOLN
INTRAVENOUS | Status: AC
  Filled 2024-09-23: qty 100

## 2024-09-23 MED ORDER — TRANEXAMIC ACID-NACL 1000-0.7 MG/100ML-% IV SOLN
1000.0000 mg | Freq: Once | INTRAVENOUS | Status: AC
  Administered 2024-09-23: 1000 mg via INTRAVENOUS
  Filled 2024-09-23: qty 100

## 2024-09-23 MED ORDER — ACETAMINOPHEN 500 MG PO TABS
1000.0000 mg | ORAL_TABLET | Freq: Four times a day (QID) | ORAL | Status: DC
  Administered 2024-09-23 – 2024-09-24 (×3): 1000 mg via ORAL
  Filled 2024-09-23 (×3): qty 2

## 2024-09-23 MED ORDER — HYDRALAZINE HCL 20 MG/ML IJ SOLN
INTRAMUSCULAR | Status: AC
  Filled 2024-09-23: qty 1

## 2024-09-23 MED ORDER — PROPOFOL 10 MG/ML IV BOLUS
INTRAVENOUS | Status: DC | PRN
Start: 2024-09-23 — End: 2024-09-23
  Administered 2024-09-23: 200 mg via INTRAVENOUS

## 2024-09-23 MED ORDER — TRANEXAMIC ACID 1000 MG/10ML IV SOLN
INTRAVENOUS | Status: DC | PRN
  Administered 2024-09-23: 2000 mg via TOPICAL

## 2024-09-23 MED ORDER — PHENYLEPHRINE HCL-NACL 20-0.9 MG/250ML-% IV SOLN
INTRAVENOUS | Status: DC | PRN
Start: 2024-09-23 — End: 2024-09-23
  Administered 2024-09-23: 25 ug/min via INTRAVENOUS

## 2024-09-23 MED ORDER — LIDOCAINE 2% (20 MG/ML) 5 ML SYRINGE
INTRAMUSCULAR | Status: AC
  Filled 2024-09-23: qty 5

## 2024-09-23 MED ORDER — ONDANSETRON HCL 4 MG PO TABS: 4.0000 mg | ORAL_TABLET | Freq: Four times a day (QID) | ORAL | Status: DC | PRN

## 2024-09-23 MED ORDER — ORAL CARE MOUTH RINSE: 15.0000 mL | Freq: Once | OROMUCOSAL | Status: AC

## 2024-09-23 MED ORDER — HYDROMORPHONE HCL 1 MG/ML IJ SOLN
INTRAMUSCULAR | Status: AC
  Filled 2024-09-23: qty 1

## 2024-09-23 MED ORDER — FENTANYL CITRATE (PF) 100 MCG/2ML IJ SOLN
INTRAMUSCULAR | Status: AC
  Filled 2024-09-23: qty 2

## 2024-09-23 MED ORDER — HYDROMORPHONE HCL 1 MG/ML IJ SOLN
INTRAMUSCULAR | Status: AC
  Filled 2024-09-23: qty 0.5

## 2024-09-23 MED ORDER — PROPOFOL 10 MG/ML IV BOLUS
INTRAVENOUS | Status: AC
  Filled 2024-09-23: qty 20

## 2024-09-23 MED ORDER — ACETAMINOPHEN 10 MG/ML IV SOLN
1000.0000 mg | Freq: Once | INTRAVENOUS | Status: AC
  Administered 2024-09-23: 1000 mg via INTRAVENOUS

## 2024-09-23 MED ORDER — OXYCODONE HCL 5 MG PO TABS
10.0000 mg | ORAL_TABLET | Freq: Four times a day (QID) | ORAL | Status: DC | PRN
  Administered 2024-09-23 – 2024-09-24 (×3): 10 mg via ORAL
  Filled 2024-09-23 (×3): qty 2

## 2024-09-23 MED ORDER — FENTANYL CITRATE (PF) 100 MCG/2ML IJ SOLN
INTRAMUSCULAR | Status: AC
  Administered 2024-09-23: 50 ug via INTRAVENOUS
  Filled 2024-09-23: qty 2

## 2024-09-23 MED ORDER — CEFAZOLIN SODIUM-DEXTROSE 2-4 GM/100ML-% IV SOLN
2.0000 g | Freq: Four times a day (QID) | INTRAVENOUS | Status: AC
  Administered 2024-09-23 (×2): 2 g via INTRAVENOUS
  Filled 2024-09-23 (×2): qty 100

## 2024-09-23 MED ORDER — OXYCODONE HCL 5 MG PO TABS: 5.0000 mg | ORAL_TABLET | Freq: Once | ORAL | Status: DC | PRN

## 2024-09-23 MED ORDER — SODIUM CHLORIDE 0.9 % IV SOLN: INTRAVENOUS | Status: DC

## 2024-09-23 MED ORDER — APIXABAN 2.5 MG PO TABS
2.5000 mg | ORAL_TABLET | Freq: Two times a day (BID) | ORAL | Status: DC
  Administered 2024-09-24: 2.5 mg via ORAL
  Filled 2024-09-23: qty 1

## 2024-09-23 MED ORDER — HYDROMORPHONE HCL 1 MG/ML IJ SOLN
0.5000 mg | INTRAMUSCULAR | Status: AC | PRN
  Administered 2024-09-23 (×4): 0.5 mg via INTRAVENOUS

## 2024-09-23 SURGICAL SUPPLY — 65 items
ALCOHOL 70% 16 OZ (MISCELLANEOUS) ×2 IMPLANT
BAG COUNTER SPONGE SURGICOUNT (BAG) ×2 IMPLANT
BAG DECANTER FOR FLEXI CONT (MISCELLANEOUS) ×2 IMPLANT
BLADE SAG 18X100X1.27 (BLADE) ×2 IMPLANT
BLADE SAW SAG 90X13X1.27 (BLADE) ×2 IMPLANT
BLADE SAW SGTL 73X25 THK (BLADE) ×2 IMPLANT
BNDG COMPR ESMARK 6X3 LF (GAUZE/BANDAGES/DRESSINGS) IMPLANT
BOWL SMART MIX CTS (DISPOSABLE) IMPLANT
CEMENT BONE REFOBACIN R1X40 US (Cement) IMPLANT
CLSR STERI-STRIP ANTIMIC 1/2X4 (GAUZE/BANDAGES/DRESSINGS) IMPLANT
COOLER ICEMAN CLASSIC (MISCELLANEOUS) ×2 IMPLANT
COVER SURGICAL LIGHT HANDLE (MISCELLANEOUS) ×2 IMPLANT
CUFF TOURN SGL QUICK 42 (TOURNIQUET CUFF) IMPLANT
CUFF TRNQT CYL 34X4.125X (TOURNIQUET CUFF) ×2 IMPLANT
DERMABOND ADVANCED .7 DNX12 (GAUZE/BANDAGES/DRESSINGS) ×2 IMPLANT
DRAPE EXTREMITY T 121X128X90 (DISPOSABLE) ×2 IMPLANT
DRAPE HALF SHEET 40X57 (DRAPES) ×2 IMPLANT
DRAPE INCISE IOBAN 66X45 STRL (DRAPES) ×2 IMPLANT
DRAPE POUCH INSTRU U-SHP 10X18 (DRAPES) ×2 IMPLANT
DRAPE SURG ORHT 6 SPLT 77X108 (DRAPES) IMPLANT
DRAPE U-SHAPE 47X51 STRL (DRAPES) ×4 IMPLANT
DRSG AQUACEL AG ADV 3.5X10 (GAUZE/BANDAGES/DRESSINGS) ×2 IMPLANT
DURAPREP 26ML APPLICATOR (WOUND CARE) ×6 IMPLANT
ELECT CAUTERY BLADE 6.4 (BLADE) ×2 IMPLANT
ELECT PENCIL ROCKER SW 15FT (MISCELLANEOUS) ×2 IMPLANT
ELECTRODE REM PT RTRN 9FT ADLT (ELECTROSURGICAL) ×2 IMPLANT
FEMUR CMTD CR STD SZ 8 RT KNEE (Joint) IMPLANT
GLOVE BIOGEL PI IND STRL 7.5 (GLOVE) ×2 IMPLANT
GLOVE ECLIPSE 7.0 STRL STRAW (GLOVE) ×10 IMPLANT
GLOVE INDICATOR 7.0 STRL GRN (GLOVE) ×2 IMPLANT
GLOVE SURG SYN 7.5 PF PI (GLOVE) ×10 IMPLANT
GOWN STRL REUS W/ TWL LRG LVL3 (GOWN DISPOSABLE) ×4 IMPLANT
GOWN TOGA ZIPPER T7+ PEEL AWAY (MISCELLANEOUS) ×2 IMPLANT
HOOD PEEL AWAY T7 (MISCELLANEOUS) ×2 IMPLANT
INSERT TIBIA KNEE RIGHT 10 (Joint) IMPLANT
IV 0.9% NACL 1000 ML (IV SOLUTION) ×2 IMPLANT
KIT BASIN OR (CUSTOM PROCEDURE TRAY) ×2 IMPLANT
KIT TURNOVER KIT B (KITS) ×2 IMPLANT
MANIFOLD NEPTUNE II (INSTRUMENTS) ×2 IMPLANT
MARKER SKIN DUAL TIP RULER LAB (MISCELLANEOUS) ×4 IMPLANT
NDL SPNL 18GX3.5 QUINCKE PK (NEEDLE) ×2 IMPLANT
NEEDLE SPNL 18GX3.5 QUINCKE PK (NEEDLE) ×1 IMPLANT
PACK TOTAL JOINT (CUSTOM PROCEDURE TRAY) ×2 IMPLANT
PAD ARMBOARD POSITIONER FOAM (MISCELLANEOUS) ×4 IMPLANT
PAD COLD SHLDR WRAP-ON (PAD) ×2 IMPLANT
PIN DRILL HDLS TROCAR 75 4PK (PIN) IMPLANT
SCREW FEMALE HEX FIX 25X2.5 (ORTHOPEDIC DISPOSABLE SUPPLIES) IMPLANT
SET HNDPC FAN SPRY TIP SCT (DISPOSABLE) ×2 IMPLANT
SOLN 0.9% NACL POUR BTL 1000ML (IV SOLUTION) ×2 IMPLANT
SOLUTION PRONTOSAN WOUND 350ML (IRRIGATION / IRRIGATOR) ×2 IMPLANT
STAPLER SKIN PROX 35W (STAPLE) IMPLANT
STEM POLY PAT PLY 35M KNEE (Knees) IMPLANT
STEM TIBIA 5 DEG SZ F R KNEE (Knees) IMPLANT
SUCTION TUBE FRAZIER 10FR DISP (SUCTIONS) IMPLANT
SUT ETHILON 2 0 FS 18 (SUTURE) ×4 IMPLANT
SUT STRATAFIX PDS+ 0 24IN (SUTURE) ×2 IMPLANT
SUT VIC AB 0 CT1 27XBRD ANBCTR (SUTURE) ×2 IMPLANT
SUT VIC AB 1 CTX 27 (SUTURE) IMPLANT
SUT VIC AB 2-0 CT1 TAPERPNT 27 (SUTURE) ×6 IMPLANT
SYR 30ML LL (SYRINGE) ×4 IMPLANT
TOWEL GREEN STERILE (TOWEL DISPOSABLE) ×2 IMPLANT
TOWEL GREEN STERILE FF (TOWEL DISPOSABLE) ×2 IMPLANT
TUBE SUCT ARGYLE STRL (TUBING) ×2 IMPLANT
UNDERPAD 30X36 HEAVY ABSORB (UNDERPADS AND DIAPERS) ×2 IMPLANT
YANKAUER SUCT BULB TIP NO VENT (SUCTIONS) ×2 IMPLANT

## 2024-09-23 NOTE — Anesthesia Postprocedure Evaluation (Signed)
 Anesthesia Post Note  Patient: Joshua Burton  Procedure(s) Performed: ARTHROPLASTY, KNEE, TOTAL (Right: Knee)     Patient location during evaluation: PACU Anesthesia Type: Regional and General Level of consciousness: awake Pain management: pain level controlled Vital Signs Assessment: post-procedure vital signs reviewed and stable Respiratory status: spontaneous breathing, nonlabored ventilation and respiratory function stable Cardiovascular status: blood pressure returned to baseline and stable Postop Assessment: no apparent nausea or vomiting Anesthetic complications: no   No notable events documented.  Last Vitals:  Vitals:   09/23/24 1423 09/23/24 1430  BP: (!) 170/103 (!) 167/101  Pulse:  86  Resp:  14  Temp:    SpO2:  98%    Last Pain:  Vitals:   09/23/24 1430  TempSrc:   PainSc: 8                  Delon Aisha Arch

## 2024-09-23 NOTE — Discharge Instructions (Signed)

## 2024-09-23 NOTE — Op Note (Signed)
 Total Knee Arthroplasty Procedure Note  Preoperative diagnosis: Right knee osteoarthritis  Postoperative diagnosis:same  Operative findings: Complete loss articular cartilage from all joint surfaces Mild flexion contracture osteopenia  Operative procedure: Right total knee arthroplasty. CPT [phone]  Surgeon: N. Ozell Cummins, MD  Assist: Ronal Morna Grave, PA-C; necessary for the timely completion of procedure and due to complexity of procedure.  Anesthesia: Spinal, regional, local  Tourniquet time: see anesthesia record  Implants used: Zimmer persona Femur: CR 8 Tibia: F Patella: 35 mm Polyethylene: 10 mm medial congruent  Indication: Joshua Burton is a 73 y.o. year old male with a history of knee pain. Having failed conservative management, the patient elected to proceed with a total knee arthroplasty.  We have reviewed the risk and benefits of the surgery and they elected to proceed after voicing understanding.  Procedure:  After informed consent was obtained and understanding of the risk were voiced including but not limited to bleeding, infection, damage to surrounding structures including nerves and vessels, blood clots, leg length inequality and the failure to achieve desired results, the operative extremity was marked with verbal confirmation of the patient in the holding area.   The patient was then brought to the operating room and transported to the operating room table in the supine position.  A tourniquet was applied to the operative extremity around the upper thigh. The operative limb was then prepped and draped in the usual sterile fashion and preoperative antibiotics were administered.  A time out was performed prior to the start of surgery confirming the correct extremity, preoperative antibiotic administration, as well as team members, implants and instruments available for the case. Correct surgical site was also confirmed with preoperative radiographs. The  limb was then elevated for exsanguination and the tourniquet was inflated. A midline incision was made and a standard medial parapatellar approach was performed.  The infrapatellar fat pad was removed.  Suprapatellar synovium was removed to reveal the anterior distal femoral cortex.  A medial peel was performed to release the capsule and the deep MCL off of the medial tibial plateau back to the semimembranosus.  The patella was then everted which showed complete loss of articular cartilage and was resected down to 15 mm and sized to a 35 mm.  A cover was placed on the patella for protection from retractors.  The knee was then brought into flexion and we then turned our attention to the femur.  The ACL was sacrificed.  Start site was drilled in the femur and the intramedullary distal femoral cutting guide was placed, set at 5 degrees valgus, taking 11 mm of distal resection for the mild flexion contracture. The distal cut was made. Osteophytes were then removed.  Next, the proximal tibial cutting guide was placed with appropriate slope, varus/valgus alignment and depth of resection.  The drop rod was attached to confirm that it was aimed at the second metatarsal.  The proximal tibial cut was made taking 2 mm off the low side. Gap blocks were then used to assess the extension gap and alignment, and appropriate soft tissue releases were performed. Attention was turned back to the femur, which was sized using the sizing guide to a size 8. Appropriate rotation of the femoral component was determined using epicondylar axis, Whiteside's line, and assessing the flexion gap under ligament tension. The appropriate size 4-in-1 cutting block was placed and checked with an angel wing and cuts were made. Posterior femoral osteophytes and uncapped bone were then removed with the  curved osteotome.  The menisci were removed.  Trial components were placed, and stability was checked in full extension, mid-flexion, and deep flexion.   PCL was resected. Proper tibial rotation was determined and marked.  The patella tracked well with a limited lateral release.  The femoral lugs were then drilled. Trial components were then removed and tibial preparation performed.  The trial tibia was pointed to the medial third of the tibial tubercle.  The tibia was sized for a size F component and prepared.  Trial components were removed.    The bony surfaces were irrigated with a pulse lavage and then dried. Bone cement was vacuum mixed on the back table, and the final components sized above were cemented into place.  Antibiotic irrigation was placed in the knee joint and soft tissues while the cement cured.  After cement had finished curing, excess cement was removed. The stability of the construct was re-evaluated throughout a range of motion and found to be acceptable. The trial liner was removed, the knee was copiously irrigated, and the knee was re-evaluated for any excess bone debris. The real polyethylene liner, 10 mm thick, was inserted and checked to ensure the locking mechanism had engaged appropriately. The tourniquet was deflated and hemostasis was achieved. The wound was irrigated with normal saline.  One gram of vancomycin  powder was placed in the surgical bed.  Topical mixture of 0.25% bupivacaine  and meloxicam  was placed in the joint for postoperative pain.  Capsular closure was performed with a #1 stratafix in flexion, subcutaneous fat closed with a 0 vicryl suture, then subcutaneous tissue closed with interrupted 2.0 vicryl suture. The skin was then closed with a 2.0 nylon and dermabond. A sterile dressing was applied.  The patient was awakened in the operating room and taken to recovery in stable condition. All sponge, needle, and instrument counts were correct at the end of the case.  Morna Grave was necessary for opening, closing, retracting, limb positioning and overall facilitation and completion of the surgery.  Position:  supine  Complications: none.  Time Out: performed   Drains/Packing: none Estimated blood loss: minimal Returned to Recovery Room: in good condition.   Mechanical VTE (DVT) Prophylaxis: sequential compression devices, TED thigh-high  Chemical VTE (DVT) Prophylaxis: eliquis poD 1  Fluid Replacement  Crystalloid: see anesthesia record Blood: none  FFP: none   Specimens Removed: 1 to pathology  Sponge and Instrument Count Correct? yes  PACU: portable radiograph - knee AP and Lateral  Plan/RTC: Return in 2 weeks for suture removal.  Weight Bearing/Load Lower Extremity: full   Implant Name Type Inv. Item Serial No. Manufacturer Lot No. LRB No. Used Action  CEMENT BONE REFOBACIN [id number] US  - [id number] Cement CEMENT BONE REFOBACIN [id number] US   ZIMMER RECON(ORTH,TRAU,BIO,SG) [id number] Right 2 Implanted  STEM TIBIA 5 DEG SZ F R KNEE - [id number] Knees STEM TIBIA 5 DEG SZ F R KNEE  ZIMMER RECON(ORTH,TRAU,BIO,SG) [id number] Right 1 Implanted  STEM POLY PAT PLY 53M KNEE - [id number] Knees STEM POLY PAT PLY 53M KNEE  ZIMMER RECON(ORTH,TRAU,BIO,SG) [id number] Right 1 Implanted  FEMUR CMTD CR STD SZ 8 RT KNEE - [id number] Joint FEMUR CMTD CR STD SZ 8 RT KNEE  [employer]MER [employer]ON(ORTH,TRAU,BIO,SG) [id number] Right 1 Implanted  INSERT TIBIA KNEE RIGHT 10 - [id number] Joint INSERT TIBIA KNEE RIGHT 10  ZIMMER RECON(ORTH,TRAU,BIO,SG) [id number] Right 1 Implanted    N. Ozell Cummins, MD [facility] 12:41 PM

## 2024-09-23 NOTE — Anesthesia Procedure Notes (Signed)
 Procedure Name: LMA Insertion Date/Time: 09/23/2024 11:31 AM  Performed by: Marva Lonni PARAS, CRNAPre-anesthesia Checklist: Patient identified, Emergency Drugs available, Suction available and Patient being monitored Patient Re-evaluated:Patient Re-evaluated prior to induction Oxygen Delivery Method: Circle System Utilized Preoxygenation: Pre-oxygenation with 100% oxygen Induction Type: IV induction Ventilation: Mask ventilation without difficulty LMA: LMA inserted LMA Size: 4.0 Number of attempts: 1 Airway Equipment and Method: Bite block Placement Confirmation: positive ETCO2 Tube secured with: Tape Dental Injury: Teeth and Oropharynx as per pre-operative assessment

## 2024-09-23 NOTE — Progress Notes (Signed)
 Orthopedic Tech Progress Note Patient Details:  Joshua Burton [birth year] [id number]  Ortho Devices Type of Ortho Device: Bone foam zero knee Ortho Device/Splint Location: LLE Ortho Device/Splint Interventions: Ordered, Application, Adjustment   Post Interventions Patient Tolerated: Well  [name] A Aurorah Schlachter 09/23/2024, 4:27 PM

## 2024-09-23 NOTE — Evaluation (Signed)
 Physical Therapy Evaluation Patient Details Name: Joshua Burton MRN: [id number] DOB: [birth year] Today's Date: 09/23/2024  History of Present Illness  73 y.o. male presents to [facility] hospital for elective R TKA. PMH includes HTN, etoh cirrhosis, hep C, esophageal varices, rt THR, L THA, L TKA.  Clinical Impression  Pt presents to PT with deficits in functional mobility, gait, balance, strength, ROM. Pt is able to ambulate for household distances with support of the RW. Pt ambulates with forefoot strike on RLE rather than heel strike as the pt has a prior R ankle fusion and utilizes an AFO at baseline. PT provides education on the TKR exercise packet and encourages frequent mobilization with staff assistance. PT will follow up tomorrow for a progression of gait and to initiate stair training.        If plan is discharge home, recommend the following: A little help with walking and/or transfers;A little help with bathing/dressing/bathroom;Assistance with cooking/housework;Assist for transportation;Help with stairs or ramp for entrance   Can travel by private vehicle        Equipment Recommendations None recommended by PT  Recommendations for Other Services       Functional Status Assessment Patient has had a recent decline in their functional status and demonstrates the ability to make significant improvements in function in a reasonable and predictable amount of time.     Precautions / Restrictions Precautions Precautions: Fall;Knee Precaution Booklet Issued: Yes (comment) Recall of Precautions/Restrictions: Intact Restrictions Weight Bearing Restrictions Per Provider Order: Yes RLE Weight Bearing Per Provider Order: Weight bearing as tolerated      Mobility  Bed Mobility Overal bed mobility: Needs Assistance Bed Mobility: Supine to Sit, Sit to Supine     Supine to sit: Contact guard Sit to supine: Contact guard assist        Transfers Overall transfer level: Needs  assistance Equipment used: Rolling walker (2 wheels) Transfers: Sit to/from Stand Sit to Stand: Contact guard assist, From elevated surface                Ambulation/Gait Ambulation/Gait assistance: Contact guard assist Gait Distance (Feet): 60 Feet Assistive device: Rolling walker (2 wheels) Gait Pattern/deviations: Step-through pattern, Decreased stance time - right Gait velocity: reduced Gait velocity interpretation: <1.31 ft/sec, indicative of household ambulator   General Gait Details: pt with slowed step-through gait, pt with R ankle fusion and walks in PF with forefoot strike rather than heel strike when without his AFO  Stairs            Wheelchair Mobility     Tilt Bed    Modified Rankin (Stroke Patients Only)       Balance Overall balance assessment: Needs assistance Sitting-balance support: No upper extremity supported, Feet supported Sitting balance-Leahy Scale: Good     Standing balance support: Bilateral upper extremity supported, Reliant on assistive device for balance Standing balance-Leahy Scale: Poor                               Pertinent Vitals/Pain Pain Assessment Pain Assessment: 0-10 Pain Score: 7  Pain Location: R knee Pain Descriptors / Indicators: Aching Pain Intervention(s): Monitored during session    Home Living Family/patient expects to be discharged to:: Private residence Living Arrangements: Spouse/significant other Available Help at Discharge: Family;Available 24 hours/day Type of Home: House Home Access: Stairs to enter Entrance Stairs-Rails: Left Entrance Stairs-Number of Steps: 1   Home Layout: One level Home Equipment:  Rolling Walker (2 wheels);Cane - single point;Tub bench;Crutches      Prior Function Prior Level of Function : Independent/Modified Independent             Mobility Comments: ambulatory without DME       Extremity/Trunk Assessment   Upper Extremity Assessment Upper  Extremity Assessment: Overall WFL for tasks assessed    Lower Extremity Assessment Lower Extremity Assessment: RLE deficits/detail RLE Deficits / Details: ROM and strength deficits as anticipated on POD 0    Cervical / Trunk Assessment Cervical / Trunk Assessment: Normal  Communication   Communication Communication: No apparent difficulties    Cognition Arousal: Alert Behavior During Therapy: WFL for tasks assessed/performed   PT - Cognitive impairments: No apparent impairments                         Following commands: Intact       Cueing Cueing Techniques: Verbal cues     General Comments General comments (skin integrity, edema, etc.): VSS on RA    Exercises Other Exercises Other Exercises: PT provides education on TKR exercise packet   Assessment/Plan    PT Assessment Patient needs continued PT services  PT Problem List Decreased strength;Decreased activity tolerance;Decreased range of motion;Decreased balance;Decreased mobility;Decreased safety awareness;Decreased knowledge of use of DME;Decreased knowledge of precautions;Pain       PT Treatment Interventions DME instruction;Gait training;Stair training;Functional mobility training;Therapeutic activities;Therapeutic exercise;Balance training;Neuromuscular re-education;Patient/family education    PT Goals (Current goals can be found in the Care Plan section)  Acute Rehab PT Goals Patient Stated Goal: to return to independence PT Goal Formulation: With patient Time For Goal Achievement: 09/27/24 Potential to Achieve Goals: Good    Frequency 7X/week     Co-evaluation               AM-PAC PT 6 Clicks Mobility  Outcome Measure Help needed turning from your back to your side while in a flat bed without using bedrails?: A Little Help needed moving from lying on your back to sitting on the side of a flat bed without using bedrails?: A Little Help needed moving to and from a bed to a chair  (including a wheelchair)?: A Little Help needed standing up from a chair using your arms (e.g., wheelchair or bedside chair)?: A Little Help needed to walk in hospital room?: A Little Help needed climbing 3-5 steps with a railing? : A Lot 6 Click Score: 17    End of Session Equipment Utilized During Treatment: Gait belt Activity Tolerance: Patient tolerated treatment well Patient left: in bed;with call bell/phone within reach;with bed alarm set Nurse Communication: Mobility status PT Visit Diagnosis: Other abnormalities of gait and mobility (R26.89);Muscle weakness (generalized) (M62.81);Pain Pain - Right/Left: Right Pain - part of body: Knee    Time: 1735-1801 PT Time Calculation (min) (ACUTE ONLY): 26 min   Charges:   PT Evaluation $PT Eval Low Complexity: 1 Low   PT General Charges $$ ACUTE PT VISIT: 1 Visit         Bernardino JINNY Ruth, PT, DPT Acute Rehabilitation Office [phone]   Bernardino JINNY Ruth 09/23/2024, 6:10 PM

## 2024-09-23 NOTE — Transfer of Care (Signed)
 Immediate Anesthesia Transfer of Care Note  Patient: Joshua Burton  Procedure(s) Performed: ARTHROPLASTY, KNEE, TOTAL (Right: Knee)  Patient Location: PACU  Anesthesia Type:General and Regional  Level of Consciousness: awake, alert , oriented, and patient cooperative  Airway & Oxygen Therapy: Patient Spontanous Breathing  Post-op Assessment: Report given to RN and Post -op Vital signs reviewed and stable  Post vital signs: Reviewed and stable  Last Vitals:  Vitals Value Taken Time  BP 177/94 09/23/24 13:15  Temp    Pulse 90 09/23/24 13:21  Resp 20 09/23/24 13:21  SpO2 97 % 09/23/24 13:21  Vitals shown include unfiled device data.  Last Pain:  Vitals:   09/23/24 1035  TempSrc:   PainSc: 0-No pain      Patients Stated Pain Goal: 0 (09/23/24 0953)  Complications: No notable events documented.

## 2024-09-23 NOTE — H&P (Signed)
 PREOPERATIVE H&P  Chief Complaint: right knee osteoarthritis  HPI: Joshua Burton is a 73 y.o. male who presents for surgical treatment of right knee osteoarthritis.  He denies any changes in medical history.  Past Surgical History:  Procedure Laterality Date   CARPAL TUNNEL RELEASE Right 11/2021   Dr. [facility] [place]   CATARACT EXTRACTION Bilateral    CATARACT EXTRACTION W/ INTRAOCULAR LENS  IMPLANT, BILATERAL Bilateral    ESOPHAGOGASTRODUODENOSCOPY N/A 09/14/2014   Procedure: ESOPHAGOGASTRODUODENOSCOPY (EGD);  Surgeon: Gordy CHRISTELLA Starch, MD;  Location: [facility];  Service: Endoscopy;  Laterality: N/A;   ESOPHAGOGASTRODUODENOSCOPY N/A 02/29/2016   Procedure: ESOPHAGOGASTRODUODENOSCOPY (EGD);  Surgeon: Norleen Hint, MD;  Location: [facility];  Service: Endoscopy;  Laterality: N/A;   EXTERNAL FIXATION LEG Right 02/25/2014   Procedure: EXTERNAL FIXATION LEG;  Surgeon: Kay Ozell Cummins, MD;  Location: [facility] OR;  Service: Orthopedics;  Laterality: Right;   EYE SURGERY Bilateral    Cat Sx & RD repair sx   FRACTURE SURGERY     HIP ARTHROSCOPY Right 1980s   INCISION AND DRAINAGE Right 02/2014   leg infection    JOINT REPLACEMENT     LAPAROSCOPIC CHOLECYSTECTOMY  2008   ORIF CONGENITAL HIP DISLOCATION Right 1975   ORIF TIBIA & FIBULA FRACTURES Right 1975   PARS PLANA VITRECTOMY Right 09/18/2018   Procedure: REAPAIR OF COMPLEX RETINAL DETACHMENT REVISION OF SCLERAL BUCKLE PARS PLANA VITRECTOMY WITH 25 GAUGE WITH PFO, ENDOLASER MEMBRANE PEEL C3F8 GAS INJECTION CRYO AIR/FLUID EXCHANGE  RIGHT EYE;  Surgeon: Alvia Norleen BIRCH, MD;  Location: [facility] OR;  Service: Ophthalmology;  Laterality: Right;   PHOTOCOAGULATION WITH LASER Right 07/17/2018   Procedure: PHOTOCOAGULATION WITH LASER;  Surgeon: Alvia Norleen BIRCH, MD;  Location: [facility] OR;  Service: Ophthalmology;  Laterality: Right;   RETINAL DETACHMENT SURGERY Bilateral    SCLERAL BUCKLE Right 07/17/2018   SCLERAL BUCKLE Right  07/17/2018   Procedure: SCLERAL BUCKLE RIGHT EYE WITH GAS INJECTION;  Surgeon: Alvia Norleen BIRCH, MD;  Location: [facility] OR;  Service: Ophthalmology;  Laterality: Right;   TOTAL HIP ARTHROPLASTY Right 2012   TOTAL HIP ARTHROPLASTY Left 06/27/2022   Procedure: LEFT TOTAL HIP ARTHROPLASTY ANTERIOR APPROACH;  Surgeon: Cummins Kay CHRISTELLA, MD;  Location: [facility] OR;  Service: Orthopedics;  Laterality: Left;   TOTAL KNEE ARTHROPLASTY Left 07/19/2021   Procedure: LEFT TOTAL KNEE ARTHROPLASTY;  Surgeon: Cummins Kay CHRISTELLA, MD;  Location: [facility] OR;  Service: Orthopedics;  Laterality: Left;   VITRECTOMY 25 GAUGE WITH SCLERAL BUCKLE Left 03/05/2020   Procedure: VITRECTOMY 25 GAUGE WITH SCLERAL BUCKLE;  Surgeon: Valdemar Rogue, MD;  Location: [facility] OR;  Service: Ophthalmology;  Laterality: Left;   Social History   Socioeconomic History   Marital status: Divorced    Spouse name: Not on file   Number of children: Not on file   Years of education: 13   Highest education level: Not on file  Occupational History   Occupation: Retired    Associate Professor: [employer]    Comment: Warehouse Manager  Tobacco Use   Smoking status: Never   Smokeless tobacco: Former    Types: Chew    Quit date: 1993  Vaping Use   Vaping status: Never Used  Substance and Sexual Activity   Alcohol use: Yes    Comment: Almost daily-beer   Drug use: Not Currently    Comment: 07/17/2018 nothing since 1983   Sexual activity: Yes  Other Topics Concern   Not on file  Social History  Narrative   Regular exercise-yes   Caffeine Use- 1 cup of coffee/day   Social Drivers of Corporate Investment Banker Strain: Not on file  Food Insecurity: Not on file  Transportation Needs: Not on file  Physical Activity: Not on file  Stress: Not on file  Social Connections: Not on file   Family History  Problem Relation Age of Onset   Arthritis Mother    Hypertension Father    Arthritis Brother    Hypertension Brother    Colon cancer Neg Hx    Other Neg Hx         hypogonadism   No Known Allergies Prior to Admission medications   Medication Sig Start Date End Date Taking? Authorizing Provider  apixaban (ELIQUIS) 2.5 MG TABS tablet Take one tab po twice daily for 30 days after surgery to prevent blood clots 09/23/24   Jule Ronal CROME, PA-C  cloNIDine  (CATAPRES ) 0.1 MG tablet Take 0.1 mg by mouth 2 (two) times daily.   Yes [provider]  lisinopril  (ZESTRIL ) 10 MG tablet Take 10 mg by mouth in the morning.   Yes [provider]  sildenafil  (VIAGRA ) 100 MG tablet Take 0.5-1 tablets (50-100 mg total) by mouth daily as needed for erectile dysfunction. 09/14/18  Yes Kassie Mallick, MD  docusate sodium  (COLACE) 100 MG capsule Take 1 capsule (100 mg total) by mouth daily as needed. 09/03/24 09/03/25  Jule Ronal CROME, PA-C  methocarbamol  (ROBAXIN ) 500 MG tablet Take 1 tablet (500 mg total) by mouth 2 (two) times daily as needed. 09/03/24   Jule Ronal CROME, PA-C  ondansetron  (ZOFRAN ) 4 MG tablet Take 1 tablet (4 mg total) by mouth every 8 (eight) hours as needed for nausea or vomiting. 09/03/24   Jule Ronal CROME, PA-C  oxyCODONE  (ROXICODONE ) 5 MG immediate release tablet Take 1-2 tablets (5-10 mg total) by mouth every 6 (six) hours as needed. To be taken after surgery 09/03/24 09/03/25  Jule Ronal CROME, PA-C     Positive ROS: All other systems have been reviewed and were otherwise negative with the exception of those mentioned in the HPI and as above.  Physical Exam: General: Alert, no acute distress Cardiovascular: No pedal edema Respiratory: No cyanosis, no use of accessory musculature GI: abdomen soft Skin: No lesions in the area of chief complaint Neurologic: Sensation intact distally Psychiatric: Patient is competent for consent with normal mood and affect Lymphatic: no lymphedema  MUSCULOSKELETAL: exam stable  Assessment: right knee osteoarthritis  Plan: Plan for Procedure(s): ARTHROPLASTY, KNEE, TOTAL  The risks  benefits and alternatives were discussed with the patient including but not limited to the risks of nonoperative treatment, versus surgical intervention including infection, bleeding, nerve injury,  blood clots, cardiopulmonary complications, morbidity, mortality, among others, and they were willing to proceed.   Ozell Cummins, MD 09/23/2024 9:48 AM

## 2024-09-23 NOTE — Plan of Care (Signed)
   Problem: Education: Goal: Knowledge of General Education information will improve Description Including pain rating scale, medication(s)/side effects and non-pharmacologic comfort measures Outcome: Progressing   Problem: Health Behavior/Discharge Planning: Goal: Ability to manage health-related needs will improve Outcome: Progressing

## 2024-09-24 ENCOUNTER — Encounter (HOSPITAL_COMMUNITY): Payer: Self-pay | Admitting: Orthopaedic Surgery

## 2024-09-24 DIAGNOSIS — Z96642 Presence of left artificial hip joint: Secondary | ICD-10-CM | POA: Diagnosis not present

## 2024-09-24 DIAGNOSIS — M1711 Unilateral primary osteoarthritis, right knee: Secondary | ICD-10-CM | POA: Diagnosis not present

## 2024-09-24 DIAGNOSIS — Z7901 Long term (current) use of anticoagulants: Secondary | ICD-10-CM | POA: Diagnosis not present

## 2024-09-24 DIAGNOSIS — Z87891 Personal history of nicotine dependence: Secondary | ICD-10-CM | POA: Diagnosis not present

## 2024-09-24 DIAGNOSIS — I10 Essential (primary) hypertension: Secondary | ICD-10-CM | POA: Diagnosis not present

## 2024-09-24 DIAGNOSIS — Z96652 Presence of left artificial knee joint: Secondary | ICD-10-CM | POA: Diagnosis not present
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
## 2024-09-24 NOTE — Care Management Obs Status (Signed)
 MEDICARE OBSERVATION STATUS NOTIFICATION   Patient Details  Name: Joshua Burton MRN: [id number] Date of Birth: 01/03/1951   Medicare Observation Status Notification Given:  Yes    Bridget Cordella Simmonds, LCSW 09/24/2024, 11:31 AM

## 2024-09-24 NOTE — Evaluation (Signed)
 Occupational Therapy Evaluation Patient Details Name: Joshua Burton MRN: [id number] DOB: 08/04/1951 Today's Date: 09/24/2024   History of Present Illness   73 y.o. male presents to [facility] hospital for elective R TKA. PMH includes HTN, etoh cirrhosis, hep C, esophageal varices, rt THR, L THA, L TKA.     Clinical Impressions PTA Pt was independent with all ADL/IADL tasks and functional mobility. Pt is currently requiring up to Mod A for ADL engagement and up to CGA for functional mobility. Pt is primarily limited by R knee pain and decreased activity tolerance. OT will continue to follow Pt acutely to facilitate progress towards functional goals. Anticipated that Pt will be safe to d/c home with no OT follow up once medically cleared.      If plan is discharge home, recommend the following:   A little help with walking and/or transfers;A little help with bathing/dressing/bathroom;Assist for transportation;Help with stairs or ramp for entrance     Functional Status Assessment   Patient has had a recent decline in their functional status and demonstrates the ability to make significant improvements in function in a reasonable and predictable amount of time.     Equipment Recommendations   BSC/3in1     Recommendations for Other Services         Precautions/Restrictions   Precautions Precautions: Fall;Knee Recall of Precautions/Restrictions: Intact Restrictions Weight Bearing Restrictions Per Provider Order: Yes RLE Weight Bearing Per Provider Order: Weight bearing as tolerated     Mobility Bed Mobility Overal bed mobility: Needs Assistance Bed Mobility: Supine to Sit, Sit to Supine     Supine to sit: Supervision Sit to supine: Supervision   General bed mobility comments: Pt required supervision for bed mobility, no physical assistance required. Bed height elevated to simulate home environment    Transfers Overall transfer level: Needs assistance Equipment used:  Rolling walker (2 wheels) Transfers: Sit to/from Stand, Bed to chair/wheelchair/BSC Sit to Stand: Contact guard assist, From elevated surface     Step pivot transfers: Contact guard assist     General transfer comment: Pt requires CGA and use of RW during mobility. Pt with increased pain this session, cued to take breaths during ambulation as Pt was noted to hold his breath when transferring.      Balance Overall balance assessment: Needs assistance Sitting-balance support: No upper extremity supported, Feet supported Sitting balance-Leahy Scale: Good Sitting balance - Comments: Pt able to maintain sitting balance at EOB and on BSC   Standing balance support: Bilateral upper extremity supported, During functional activity, Reliant on assistive device for balance Standing balance-Leahy Scale: Poor Standing balance comment: dependent on RW and CGA                           ADL either performed or assessed with clinical judgement   ADL Overall ADL's : Needs assistance/impaired Eating/Feeding: Independent;Sitting   Grooming: Contact guard assist;Standing   Upper Body Bathing: Independent;Sitting   Lower Body Bathing: Moderate assistance;Sitting/lateral leans   Upper Body Dressing : Independent;Sitting   Lower Body Dressing: Moderate assistance;Sitting/lateral leans   Toilet Transfer: Contact guard assist;Ambulation;BSC/3in1;Rolling walker (2 wheels)                   Vision Patient Visual Report: No change from baseline Vision Assessment?: No apparent visual deficits     Perception         Praxis         Pertinent Vitals/Pain Pain  Assessment Pain Assessment: 0-10 Pain Score: 7  Pain Location: R knee and hip Pain Descriptors / Indicators: Constant, Aching Pain Intervention(s): Limited activity within patient's tolerance, Monitored during session, Patient requesting pain meds-RN notified, Repositioned     Extremity/Trunk Assessment Upper  Extremity Assessment Upper Extremity Assessment: Overall WFL for tasks assessed   Lower Extremity Assessment Lower Extremity Assessment: Defer to PT evaluation   Cervical / Trunk Assessment Cervical / Trunk Assessment: Normal   Communication Communication Communication: No apparent difficulties   Cognition Arousal: Alert Behavior During Therapy: WFL for tasks assessed/performed Cognition: No apparent impairments                               Following commands: Intact       Cueing  General Comments   Cueing Techniques: Verbal cues  Pt educated on importance of taking deep breaths during functional tasks.   Exercises     Shoulder Instructions      Home Living Family/patient expects to be discharged to:: Private residence Living Arrangements: Spouse/significant other Available Help at Discharge: Family;Available 24 hours/day Type of Home: House Home Access: Stairs to enter [employer] of Steps: 1 Entrance Stairs-Rails: Left Home Layout: One level     Bathroom Shower/Tub: Chief Strategy Officer: Standard Bathroom Accessibility: Yes How Accessible: Accessible via walker Home Equipment: Rolling Walker (2 wheels);Cane - single point;Tub bench;Crutches;Grab bars - tub/shower;Grab bars - toilet          Prior Functioning/Environment Prior Level of Function : Independent/Modified Independent             Mobility Comments: ambulatory without DME ADLs Comments: Independent    OT Problem List: Decreased activity tolerance;Impaired balance (sitting and/or standing);Pain   OT Treatment/Interventions: Self-care/ADL training;Energy conservation;Balance training;Therapeutic activities      OT Goals(Current goals can be found in the care plan section)   Acute Rehab OT Goals Patient Stated Goal: to go home OT Goal Formulation: With patient Time For Goal Achievement: 10/08/24 Potential to Achieve Goals: Good ADL Goals Pt  Will Perform Grooming: with supervision;standing Pt Will Perform Lower Body Dressing: with min assist;sit to/from stand Pt Will Transfer to Toilet: with modified independence;ambulating;bedside commode   OT Frequency:  Min 2X/week    Co-evaluation              AM-PAC OT 6 Clicks Daily Activity     Outcome Measure Help from another person eating meals?: None Help from another person taking care of personal grooming?: A Little Help from another person toileting, which includes using toliet, bedpan, or urinal?: A Little Help from another person bathing (including washing, rinsing, drying)?: A Lot Help from another person to put on and taking off regular upper body clothing?: None Help from another person to put on and taking off regular lower body clothing?: A Lot 6 Click Score: 18   End of Session Equipment Utilized During Treatment: Rolling walker (2 wheels) Nurse Communication: Patient requests pain meds  Activity Tolerance: Patient tolerated treatment well Patient left: in bed;with call bell/phone within reach  OT Visit Diagnosis: Unsteadiness on feet (R26.81);Pain Pain - Right/Left: Right Pain - part of body: Knee                Time: 9146-9090 OT Time Calculation (min): 16 min Charges:  OT General Charges $OT Visit: 1 Visit OT Evaluation $OT Eval Moderate Complexity: 1 Mod  Haven Pylant L, OTR/L.  [facility] Acute  Rehabilitation  Office: [phone]   Maurilio JINNY Peek 09/24/2024, 9:37 AM

## 2024-09-24 NOTE — Progress Notes (Signed)
 Discharge   Patient expressed verbal understanding of discharge POC.   Patient given time to ask any questions.  Additional education included in AVS.  Alert oriented in good spirits.   PIV removed by [facility] RN.  Pressure dressings intact.  Patient has medication at bedside and reports having education regarding medication and post op care by disciplinary team.  Blue foam, BSC 3:1, knee device at bedside.    Ride at [facility].  Volunteer called
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
## 2024-09-24 NOTE — Plan of Care (Signed)
  Problem: Education: Goal: Knowledge of General Education information will improve Description: Including pain rating scale, medication(s)/side effects and non-pharmacologic comfort measures Outcome: Completed/Met   Problem: Health Behavior/Discharge Planning: Goal: Ability to manage health-related needs will improve Outcome: Completed/Met   Problem: Clinical Measurements: Goal: Ability to maintain clinical measurements within normal limits will improve Outcome: Completed/Met Goal: Will remain free from infection Outcome: Completed/Met Goal: Diagnostic test results will improve Outcome: Completed/Met Goal: Respiratory complications will improve Outcome: Completed/Met Goal: Cardiovascular complication will be avoided Outcome: Completed/Met   Problem: Activity: Goal: Risk for activity intolerance will decrease Outcome: Completed/Met   Problem: Nutrition: Goal: Adequate nutrition will be maintained Outcome: Completed/Met   Problem: Coping: Goal: Level of anxiety will decrease Outcome: Completed/Met   Problem: Elimination: Goal: Will not experience complications related to bowel motility Outcome: Completed/Met Goal: Will not experience complications related to urinary retention Outcome: Completed/Met   Problem: Pain Managment: Goal: General experience of comfort will improve and/or be controlled Outcome: Completed/Met   Problem: Safety: Goal: Ability to remain free from injury will improve Outcome: Completed/Met   Problem: Skin Integrity: Goal: Risk for impaired skin integrity will decrease Outcome: Completed/Met   Problem: Education: Goal: Knowledge of the prescribed therapeutic regimen will improve Outcome: Completed/Met Goal: Individualized Educational Video(s) Outcome: Completed/Met   Problem: Activity: Goal: Ability to avoid complications of mobility impairment will improve Outcome: Completed/Met Goal: Range of joint motion will improve Outcome:  Completed/Met   Problem: Clinical Measurements: Goal: Postoperative complications will be avoided or minimized Outcome: Completed/Met   Problem: Pain Management: Goal: Pain level will decrease with appropriate interventions Outcome: Completed/Met   Problem: Skin Integrity: Goal: Will show signs of wound healing Outcome: Completed/Met

## 2024-09-24 NOTE — TOC CM/SW Note (Signed)
    Durable Medical Equipment  (From admission, onward)           Start     Ordered   09/23/24 1537  DME Walker rolling  Once       Question Answer Comment  Walker: With 5 Inch Wheels   Patient needs a walker to treat with the following condition Status post left partial knee replacement      09/23/24 1536   09/23/24 1537  DME 3 n 1  Once        09/23/24 1536   09/23/24 1537  DME Bedside commode  Once       Question:  Patient needs a bedside commode to treat with the following condition  Answer:  Status post left partial knee replacement   09/23/24 1536           Patient needs a bedside commode, due to being confined to a room with no bathroom

## 2024-09-24 NOTE — Progress Notes (Signed)
 Physical Therapy Treatment Patient Details Name: Joshua Burton MRN: [id number] DOB: [birth year] Today's Date: 09/24/2024   History of Present Illness 73 y.o. male presents to [facility] hospital for elective R TKA. PMH includes HTN, etoh cirrhosis, hep C, esophageal varices, rt THR, L THA, L TKA.    PT Comments  Pt had increased pain after OT session, waited until he had received next dose of pain meds to see for PT. Pt mobilizing in bed independently as well as STS. Pt ambulated 25' with RW and supervision. Has increased pain with increased distance and will likely travel home to [place] today so did not push distance further to help keep pain under control. Pt hold breath with most activity, exercises, use of urinal. Education given on proper breathing and encouraged him to work on this with all activity. Pt able to practice one step with RW and supervision. Also practiced there ex. Ready for d/c home after he has his lunch.     If plan is discharge home, recommend the following: A little help with walking and/or transfers;A little help with bathing/dressing/bathroom;Assistance with cooking/housework;Assist for transportation;Help with stairs or ramp for entrance   Can travel by private vehicle        Equipment Recommendations  None recommended by PT    Recommendations for Other Services       Precautions / Restrictions Precautions Precautions: Fall;Knee Precaution Booklet Issued: No Recall of Precautions/Restrictions: Intact Precaution/Restrictions Comments: reviewed proper positioning and use of zero knee foam and CPM Restrictions Weight Bearing Restrictions Per Provider Order: Yes RLE Weight Bearing Per Provider Order: Weight bearing as tolerated     Mobility  Bed Mobility Overal bed mobility: Modified Independent Bed Mobility: Supine to Sit     Supine to sit: Modified independent (Device/Increase time)     General bed mobility comments: able to perform bed mobility safely without  physical assist. Sometimes using RLE to assist LLE    Transfers Overall transfer level: Modified independent Equipment used: Rolling walker (2 wheels) Transfers: Sit to/from Stand Sit to Stand: Modified independent (Device/Increase time)           General transfer comment: mod I for sit>stand though pt needed cues for hand placement first time    Ambulation/Gait Ambulation/Gait assistance: Supervision Gait Distance (Feet): 70 Feet Assistive device: Rolling walker (2 wheels) Gait Pattern/deviations: Step-through pattern, Decreased stance time - right Gait velocity: reduced Gait velocity interpretation: <1.8 ft/sec, indicate of risk for recurrent falls   General Gait Details: pt with slowed step-through gait, pt with R ankle fusion and walks in PF with forefoot strike rather than heel strike when without his AFO. Stable with ambulation but pain increasing as he is up so do not push distance as pt will likely travel home today   Stairs Stairs: Yes Stairs assistance: Supervision Stair Management: No rails, Step to pattern, Forwards Number of Stairs: 1 General stair comments: vc's for sequencing   Wheelchair Mobility     Tilt Bed    Modified Rankin (Stroke Patients Only)       Balance Overall balance assessment: Needs assistance Sitting-balance support: No upper extremity supported, Feet supported Sitting balance-Leahy Scale: Good Sitting balance - Comments: Pt able to maintain sitting balance at EOB   Standing balance support: Bilateral upper extremity supported, During functional activity, Reliant on assistive device for balance Standing balance-Leahy Scale: Poor Standing balance comment: dependent on RW  Communication Communication Communication: No apparent difficulties  Cognition Arousal: Alert Behavior During Therapy: WFL for tasks assessed/performed   PT - Cognitive impairments: No apparent impairments                          Following commands: Intact      Cueing Cueing Techniques: Verbal cues  Exercises Total Joint Exercises Ankle Circles/Pumps: AROM, Left, 10 reps, Seated Quad Sets: AROM, Right, 5 reps, Supine Heel Slides: AROM, Right, 10 reps, Supine, Seated Hip ABduction/ADduction: AAROM, Right, 10 reps, Supine Straight Leg Raises: Right, 5 reps, AROM, Supine Long Arc Quad: AROM, Right, 10 reps, Seated Knee Flexion: AAROM, 5 reps, Seated Goniometric ROM: 10-75    General Comments General comments (skin integrity, edema, etc.): cues for breathing consistently and specifically with ther ex to  exhale on the effort part of motion. Discussed use of ice and elevation at home      Pertinent Vitals/Pain Pain Assessment Pain Assessment: 0-10 Pain Score: 4  Pain Location: R knee and hip (ITB) Pain Descriptors / Indicators: Constant, Aching Pain Intervention(s): Limited activity within patient's tolerance, Monitored during session, Premedicated before session    Home Living                          Prior Function            PT Goals (current goals can now be found in the care plan section) Acute Rehab PT Goals Patient Stated Goal: to return to independence PT Goal Formulation: With patient Time For Goal Achievement: 09/27/24 Potential to Achieve Goals: Good Progress towards PT goals: Progressing toward goals    Frequency    7X/week      PT Plan      Co-evaluation              AM-PAC PT 6 Clicks Mobility   Outcome Measure  Help needed turning from your back to your side while in a flat bed without using bedrails?: A Little Help needed moving from lying on your back to sitting on the side of a flat bed without using bedrails?: A Little Help needed moving to and from a bed to a chair (including a wheelchair)?: A Little Help needed standing up from a chair using your arms (e.g., wheelchair or bedside chair)?: A Little Help needed to walk in  hospital room?: A Little Help needed climbing 3-5 steps with a railing? : A Little 6 Click Score: 18    End of Session Equipment Utilized During Treatment: Gait belt Activity Tolerance: Patient tolerated treatment well Patient left: with call bell/phone within reach;in chair Nurse Communication: Mobility status PT Visit Diagnosis: Other abnormalities of gait and mobility (R26.89);Muscle weakness (generalized) (M62.81);Pain Pain - Right/Left: Right Pain - part of body: Knee     Time: 1157-1230 PT Time Calculation (min) (ACUTE ONLY): 33 min  Charges:    $Gait Training: 8-22 mins $Therapeutic Exercise: 8-22 mins PT General Charges $$ ACUTE PT VISIT: 1 Visit                     Richerd Lipoma, PT  Acute Rehab Services Secure chat preferred Office [phone]    Richerd CROME Pharrell Ledford 09/24/2024, 12:47 PM

## 2024-09-24 NOTE — Progress Notes (Signed)
 Informed Cathy of [facility] answering services that patient's surgical wound is bleeding and fully soaked. T This nurse applied ABD pads on top and covered it with kerlix at the  moment.

## 2024-09-24 NOTE — Plan of Care (Signed)
  Problem: Clinical Measurements: Goal: Ability to maintain clinical measurements within normal limits will improve 09/24/2024 0015 by Mallory Marks, Cyra Spader Anne D, RN Outcome: Not Progressing 09/24/2024 0015 by Mallory Marks, Kyrsten Deleeuw Anne D, RN Outcome: Not Progressing Goal: Will remain free from infection 09/24/2024 0015 by Mallory Marks Laveta Arlean JONETTA, RN Outcome: Not Progressing 09/24/2024 0015 by Mallory Marks, Kishana Battey Anne D, RN Outcome: Not Progressing Goal: Diagnostic test results will improve 09/24/2024 0015 by Dela Pena, Brison Fiumara Anne D, RN Outcome: Not Progressing 09/24/2024 0015 by Mallory Marks, Osamah Schmader Anne D, RN Outcome: Not Progressing Goal: Respiratory complications will improve 09/24/2024 0015 by Dela Pena, Franchot Pollitt Anne D, RN Outcome: Not Progressing 09/24/2024 0015 by Mallory Marks, Criss Bartles Anne D, RN Outcome: Not Progressing Goal: Cardiovascular complication will be avoided Outcome: Not Progressing   Problem: Activity: Goal: Risk for activity intolerance will decrease Outcome: Not Progressing   Problem: Pain Managment: Goal: General experience of comfort will improve and/or be controlled Outcome: Not Progressing   Problem: Safety: Goal: Ability to remain free from injury will improve Outcome: Not Progressing

## 2024-09-24 NOTE — Progress Notes (Signed)
 Subjective: 1 Day Post-Op Procedure(s) (LRB): ARTHROPLASTY, KNEE, TOTAL (Right) Patient reports pain as mild.    Objective: Vital signs in last 24 hours: Temp:  [97 F (36.1 C)-98.2 F (36.8 C)] 97.8 F (36.6 C) (11/18 0315) Pulse Rate:  [56-105] 104 (11/18 0315) Resp:  [13-18] 18 (11/18 0315) BP: (140-170)/(91-110) 140/107 (11/18 0315) SpO2:  [94 %-100 %] 94 % (11/18 0315) Weight:  [94.8 kg] 94.8 kg (11/17 0946)  Intake/Output from previous day: 11/17 0701 - 11/18 0700 In: 2636 [P.O.:1036; I.V.:1500; IV Piggyback:100] Out: 740 [Urine:740] Intake/Output this shift: No intake/output data recorded.  Recent Labs    09/23/24 1022  HGB 14.8   Recent Labs    09/23/24 1022  WBC 4.8  RBC 4.71  HCT 43.6  PLT 153   Recent Labs    09/23/24 1021  NA 132*  K 4.3  CL 101  CO2 19*  BUN 12  CREATININE 0.70  GLUCOSE 100*  CALCIUM 8.8*   Recent Labs    09/23/24 1021  INR 1.2    Neurologically intact Neurovascular intact Sensation intact distally Intact pulses distally Incision: moderate drainage No cellulitis present Compartment soft   Assessment/Plan: 1 Day Post-Op Procedure(s) (LRB): ARTHROPLASTY, KNEE, TOTAL (Right) Advance diet Up with therapy D/C IV fluids Discharge home with home health once cleared by PT WBAT RLE Bandage changed by me today Patient has already picked up all post-op meds       Ronal LITTIE Grave 09/24/2024, 7:52 AM

## 2024-09-25 ENCOUNTER — Telehealth: Payer: Self-pay | Admitting: *Deleted

## 2024-09-25 ENCOUNTER — Other Ambulatory Visit: Payer: Self-pay | Admitting: Physician Assistant

## 2024-09-25 MED ORDER — ROPIVACAINE HCL 5 MG/ML IJ SOLN
INTRAMUSCULAR | Status: DC | PRN
  Administered 2024-09-23: 20 mL via PERINEURAL

## 2024-09-25 MED ORDER — DOXYCYCLINE HYCLATE 100 MG PO TABS: 100.0000 mg | ORAL_TABLET | Freq: Two times a day (BID) | ORAL | 0 refills | Status: AC

## 2024-09-25 NOTE — Addendum Note (Signed)
 Addendum  created 09/25/24 2019 by Peggye Delon Brunswick, MD   Child order released for a procedure order, Clinical Note Signed, Intraprocedure Blocks edited, Intraprocedure Meds edited, SmartForm saved

## 2024-09-25 NOTE — Telephone Encounter (Signed)
 Spoke with patient and he is aware of all new instructions.

## 2024-09-25 NOTE — Telephone Encounter (Signed)
 Spoke with patient today and he continued to bleed some overnight. He is using his CPM and feels much better being home. I gave him an extra dressing yesterday before d/c since he lives far away. Dr. Jerri had asked me to tell him only take his Eliquis once per day. He just asked me to order some Doxycycline  as well. Would you do that> I don't know your normal dose. Thank you.

## 2024-09-25 NOTE — Anesthesia Procedure Notes (Addendum)
 Anesthesia Regional Block: Adductor canal block   Pre-Anesthetic Checklist: , timeout performed,  Correct Patient, Correct Site, Correct Laterality,  Correct Procedure, Correct Position, site marked,  Risks and benefits discussed,  Surgical consent,  Pre-op evaluation,  At surgeon's request and post-op pain management  Laterality: Right  Prep: chloraprep       Needles:  Injection technique: Single-shot  Needle Type: Echogenic Stimulator Needle     Needle Length: 9cm  Needle Gauge: 21     Additional Needles:   Procedures:,,,, ultrasound used (permanent image in chart),,    Narrative:  Start time: 09/23/2024 10:30 AM End time: 09/23/2024 10:34 AM Injection made incrementally with aspirations every 5 mL.  Performed by: Personally  Anesthesiologist: Peggye Delon Brunswick, MD  Additional Notes: Discussed risks and benefits of nerve block including, but not limited to, prolonged and/or permanent nerve injury involving sensory and/or motor function. Monitors were applied and a time-out was performed. The nerve and associated structures were visualized under ultrasound guidance. After negative aspiration, local anesthetic was slowly injected around the nerve. There was no evidence of high pressure during the procedure. There were no paresthesias. VSS remained stable and the patient tolerated the procedure well.

## 2024-09-25 NOTE — Telephone Encounter (Signed)
 Sent in docy.  Back off cpm/exercises today and use compression socks

## 2024-09-26 ENCOUNTER — Telehealth: Payer: Self-pay | Admitting: *Deleted

## 2024-09-26 NOTE — Telephone Encounter (Signed)
 Sounds good. Thank you

## 2024-09-26 NOTE — Telephone Encounter (Signed)
 Patient called and asked if he could go ahead and change his Aquacel dressing today. I gave him an extra from hospital. He said that old blood that it had absorbed was now leaking out from underneath. He didn't think it was still bleeding, but he is going to give it a sec or two and look at it after he takes this one off and he'll call back if it is still actively bleeding. Just FYI.

## 2024-09-26 NOTE — Telephone Encounter (Signed)
 Thank you :)

## 2024-09-30 ENCOUNTER — Telehealth: Payer: Self-pay | Admitting: Orthopaedic Surgery

## 2024-09-30 MED ORDER — OXYCODONE HCL 5 MG PO TABS: 5.0000 mg | ORAL_TABLET | Freq: Two times a day (BID) | ORAL | 0 refills | Status: AC | PRN

## 2024-09-30 NOTE — Telephone Encounter (Signed)
 done

## 2024-09-30 NOTE — Telephone Encounter (Signed)
 Patient called and needs a refill on Oxycodone . CB#[phone]

## 2024-09-30 NOTE — Addendum Note (Signed)
 Addended by: JERRI LOISE SHARPER on: 09/30/2024 10:26 AM   Modules accepted: Orders

## 2024-10-02 ENCOUNTER — Telehealth: Payer: Self-pay | Admitting: Radiology

## 2024-10-02 NOTE — Telephone Encounter (Signed)
 Joshua Burton called to advise us  that patient refused HHPT visit today, just said he cannot do today.  FYI only.

## 2024-10-08 ENCOUNTER — Ambulatory Visit: Admitting: Physician Assistant

## 2024-10-08 DIAGNOSIS — Z96651 Presence of right artificial knee joint: Secondary | ICD-10-CM

## 2024-10-08 MED ORDER — MUPIROCIN 2 % EX OINT: 1.0000 | TOPICAL_OINTMENT | Freq: Two times a day (BID) | CUTANEOUS | 0 refills | Status: AC

## 2024-10-08 MED ORDER — OXYCODONE-ACETAMINOPHEN 5-325 MG PO TABS: 1.0000 | ORAL_TABLET | Freq: Three times a day (TID) | ORAL | 0 refills | Status: DC | PRN
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
## 2024-10-10 DIAGNOSIS — M1711 Unilateral primary osteoarthritis, right knee: Secondary | ICD-10-CM | POA: Diagnosis not present

## 2024-10-10 DIAGNOSIS — M6281 Muscle weakness (generalized): Secondary | ICD-10-CM | POA: Diagnosis not present

## 2024-10-10 DIAGNOSIS — M25661 Stiffness of right knee, not elsewhere classified: Secondary | ICD-10-CM | POA: Diagnosis not present

## 2024-10-10 DIAGNOSIS — R262 Difficulty in walking, not elsewhere classified: Secondary | ICD-10-CM | POA: Diagnosis not present

## 2024-10-16 DIAGNOSIS — R262 Difficulty in walking, not elsewhere classified: Secondary | ICD-10-CM | POA: Diagnosis not present

## 2024-10-16 DIAGNOSIS — M25661 Stiffness of right knee, not elsewhere classified: Secondary | ICD-10-CM | POA: Diagnosis not present

## 2024-10-16 DIAGNOSIS — M6281 Muscle weakness (generalized): Secondary | ICD-10-CM | POA: Diagnosis not present

## 2024-10-18 ENCOUNTER — Ambulatory Visit: Admitting: Physician Assistant

## 2024-10-18 DIAGNOSIS — Z96651 Presence of right artificial knee joint: Secondary | ICD-10-CM

## 2024-10-18 MED ORDER — OXYCODONE-ACETAMINOPHEN 5-325 MG PO TABS: 1.0000 | ORAL_TABLET | Freq: Three times a day (TID) | ORAL | 0 refills | Status: AC | PRN
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
## 2024-10-18 NOTE — Telephone Encounter (Signed)
 Patient would like to extend his CPM for a few more weeks.  Can you reach out to [facility] about this?

## 2024-10-21 NOTE — Telephone Encounter (Signed)
 Forwarded to [employer].

## 2024-11-08 ENCOUNTER — Other Ambulatory Visit: Payer: Self-pay

## 2024-11-08 ENCOUNTER — Ambulatory Visit: Admitting: Physician Assistant

## 2024-11-08 DIAGNOSIS — Z96651 Presence of right artificial knee joint: Secondary | ICD-10-CM
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
## 2024-12-10 ENCOUNTER — Encounter: Admitting: Physician Assistant

## 2024-12-11 ENCOUNTER — Ambulatory Visit: Admitting: Physician Assistant

## 2024-12-11 DIAGNOSIS — Z96651 Presence of right artificial knee joint: Secondary | ICD-10-CM
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
## 2025-03-11 ENCOUNTER — Ambulatory Visit: Admitting: Physician Assistant

## 2025-03-18 ENCOUNTER — Encounter (INDEPENDENT_AMBULATORY_CARE_PROVIDER_SITE_OTHER): Admitting: Ophthalmology
